# Patient Record
Sex: Female | Born: 1935 | Race: White | Hispanic: No | Smoking: Never smoker
Health system: Southern US, Community
[De-identification: ages and names within clinical notes are randomized; demographics above are authoritative.]

## PROBLEM LIST (undated history)

## (undated) DIAGNOSIS — G479 Sleep disorder, unspecified: Secondary | ICD-10-CM

## (undated) DIAGNOSIS — I5032 Chronic diastolic (congestive) heart failure: Secondary | ICD-10-CM

## (undated) DIAGNOSIS — J189 Pneumonia, unspecified organism: Secondary | ICD-10-CM

## (undated) DIAGNOSIS — R112 Nausea with vomiting, unspecified: Secondary | ICD-10-CM

## (undated) DIAGNOSIS — T8859XA Other complications of anesthesia, initial encounter: Secondary | ICD-10-CM

## (undated) DIAGNOSIS — Z9889 Other specified postprocedural states: Secondary | ICD-10-CM

## (undated) DIAGNOSIS — M316 Other giant cell arteritis: Secondary | ICD-10-CM

## (undated) DIAGNOSIS — I480 Paroxysmal atrial fibrillation: Secondary | ICD-10-CM

## (undated) DIAGNOSIS — I2699 Other pulmonary embolism without acute cor pulmonale: Secondary | ICD-10-CM

## (undated) DIAGNOSIS — N186 End stage renal disease: Secondary | ICD-10-CM

## (undated) DIAGNOSIS — H269 Unspecified cataract: Secondary | ICD-10-CM

## (undated) DIAGNOSIS — I998 Other disorder of circulatory system: Secondary | ICD-10-CM

## (undated) DIAGNOSIS — I10 Essential (primary) hypertension: Secondary | ICD-10-CM

## (undated) DIAGNOSIS — T4145XA Adverse effect of unspecified anesthetic, initial encounter: Secondary | ICD-10-CM

## (undated) DIAGNOSIS — I272 Pulmonary hypertension, unspecified: Secondary | ICD-10-CM

## (undated) DIAGNOSIS — Z87898 Personal history of other specified conditions: Secondary | ICD-10-CM

## (undated) DIAGNOSIS — K219 Gastro-esophageal reflux disease without esophagitis: Secondary | ICD-10-CM

## (undated) DIAGNOSIS — I340 Nonrheumatic mitral (valve) insufficiency: Secondary | ICD-10-CM

## (undated) DIAGNOSIS — H9190 Unspecified hearing loss, unspecified ear: Secondary | ICD-10-CM

## (undated) DIAGNOSIS — K5909 Other constipation: Secondary | ICD-10-CM

## (undated) DIAGNOSIS — E039 Hypothyroidism, unspecified: Secondary | ICD-10-CM

## (undated) DIAGNOSIS — D649 Anemia, unspecified: Secondary | ICD-10-CM

## (undated) HISTORY — DX: Other pulmonary embolism without acute cor pulmonale: I26.99

## (undated) HISTORY — DX: Nonrheumatic mitral (valve) insufficiency: I34.0

## (undated) HISTORY — DX: Pulmonary hypertension, unspecified: I27.20

## (undated) HISTORY — DX: Anemia, unspecified: D64.9

## (undated) HISTORY — DX: Essential (primary) hypertension: I10

## (undated) HISTORY — DX: Other disorder of circulatory system: I99.8

## (undated) HISTORY — PX: ABDOMINAL HYSTERECTOMY: SHX81

## (undated) HISTORY — DX: Hypothyroidism, unspecified: E03.9

## (undated) HISTORY — PX: OTHER SURGICAL HISTORY: SHX169

## (undated) HISTORY — PX: EYE SURGERY: SHX253

## (undated) HISTORY — DX: Paroxysmal atrial fibrillation: I48.0

## (undated) HISTORY — DX: Other giant cell arteritis: M31.6

## (undated) HISTORY — DX: Unspecified cataract: H26.9

## (undated) HISTORY — DX: Other specified postprocedural states: Z98.890

---

## 1995-01-21 DIAGNOSIS — I2699 Other pulmonary embolism without acute cor pulmonale: Secondary | ICD-10-CM

## 1995-01-21 HISTORY — DX: Other pulmonary embolism without acute cor pulmonale: I26.99

## 1997-05-31 ENCOUNTER — Other Ambulatory Visit: Admission: RE | Admit: 1997-05-31 | Discharge: 1997-05-31 | Payer: Self-pay | Admitting: Family Medicine

## 1997-07-12 ENCOUNTER — Other Ambulatory Visit: Admission: RE | Admit: 1997-07-12 | Discharge: 1997-07-12 | Payer: Self-pay | Admitting: Family Medicine

## 1997-08-15 ENCOUNTER — Other Ambulatory Visit: Admission: RE | Admit: 1997-08-15 | Discharge: 1997-08-15 | Payer: Self-pay | Admitting: Family Medicine

## 1997-10-26 ENCOUNTER — Other Ambulatory Visit: Admission: RE | Admit: 1997-10-26 | Discharge: 1997-10-26 | Payer: Self-pay | Admitting: Family Medicine

## 1998-04-26 ENCOUNTER — Ambulatory Visit (HOSPITAL_COMMUNITY): Admission: RE | Admit: 1998-04-26 | Discharge: 1998-04-26 | Payer: Self-pay | Admitting: *Deleted

## 1998-12-26 ENCOUNTER — Ambulatory Visit (HOSPITAL_COMMUNITY): Admission: RE | Admit: 1998-12-26 | Discharge: 1998-12-26 | Payer: Self-pay | Admitting: Family Medicine

## 1999-06-05 ENCOUNTER — Encounter: Payer: Self-pay | Admitting: Family Medicine

## 1999-06-05 ENCOUNTER — Encounter: Admission: RE | Admit: 1999-06-05 | Discharge: 1999-06-05 | Payer: Self-pay | Admitting: Family Medicine

## 1999-11-12 ENCOUNTER — Other Ambulatory Visit: Admission: RE | Admit: 1999-11-12 | Discharge: 1999-11-12 | Payer: Self-pay | Admitting: Family Medicine

## 2000-11-18 ENCOUNTER — Encounter: Admission: RE | Admit: 2000-11-18 | Discharge: 2000-11-18 | Payer: Self-pay | Admitting: Family Medicine

## 2000-11-18 ENCOUNTER — Encounter: Payer: Self-pay | Admitting: Family Medicine

## 2001-12-23 ENCOUNTER — Encounter: Admission: RE | Admit: 2001-12-23 | Discharge: 2001-12-23 | Payer: Self-pay | Admitting: Family Medicine

## 2001-12-23 ENCOUNTER — Encounter: Payer: Self-pay | Admitting: Family Medicine

## 2003-03-13 ENCOUNTER — Encounter: Admission: RE | Admit: 2003-03-13 | Discharge: 2003-03-13 | Payer: Self-pay | Admitting: Family Medicine

## 2003-03-21 ENCOUNTER — Encounter: Admission: RE | Admit: 2003-03-21 | Discharge: 2003-03-21 | Payer: Self-pay | Admitting: Family Medicine

## 2004-04-16 ENCOUNTER — Encounter: Admission: RE | Admit: 2004-04-16 | Discharge: 2004-04-16 | Payer: Self-pay | Admitting: Family Medicine

## 2004-09-20 ENCOUNTER — Ambulatory Visit: Payer: Self-pay | Admitting: Family Medicine

## 2004-09-20 ENCOUNTER — Inpatient Hospital Stay (HOSPITAL_COMMUNITY): Admission: EM | Admit: 2004-09-20 | Discharge: 2004-09-22 | Payer: Self-pay | Admitting: Emergency Medicine

## 2005-03-18 ENCOUNTER — Ambulatory Visit (HOSPITAL_COMMUNITY): Admission: RE | Admit: 2005-03-18 | Discharge: 2005-03-18 | Payer: Self-pay | Admitting: Ophthalmology

## 2005-05-29 ENCOUNTER — Encounter: Admission: RE | Admit: 2005-05-29 | Discharge: 2005-05-29 | Payer: Self-pay | Admitting: Family Medicine

## 2006-05-29 ENCOUNTER — Ambulatory Visit: Payer: Self-pay

## 2006-05-29 ENCOUNTER — Encounter: Payer: Self-pay | Admitting: Cardiology

## 2006-07-23 ENCOUNTER — Encounter: Admission: RE | Admit: 2006-07-23 | Discharge: 2006-07-23 | Payer: Self-pay | Admitting: Family Medicine

## 2007-07-28 ENCOUNTER — Encounter: Admission: RE | Admit: 2007-07-28 | Discharge: 2007-07-28 | Payer: Self-pay | Admitting: Family Medicine

## 2008-03-09 ENCOUNTER — Ambulatory Visit: Payer: Self-pay

## 2008-03-09 ENCOUNTER — Encounter (INDEPENDENT_AMBULATORY_CARE_PROVIDER_SITE_OTHER): Payer: Self-pay | Admitting: Family Medicine

## 2008-03-29 ENCOUNTER — Ambulatory Visit: Payer: Self-pay | Admitting: Cardiology

## 2008-03-29 ENCOUNTER — Encounter: Payer: Self-pay | Admitting: Cardiology

## 2008-03-29 DIAGNOSIS — R002 Palpitations: Secondary | ICD-10-CM

## 2008-03-29 DIAGNOSIS — N259 Disorder resulting from impaired renal tubular function, unspecified: Secondary | ICD-10-CM | POA: Insufficient documentation

## 2008-03-29 DIAGNOSIS — R0602 Shortness of breath: Secondary | ICD-10-CM | POA: Insufficient documentation

## 2008-03-29 DIAGNOSIS — I080 Rheumatic disorders of both mitral and aortic valves: Secondary | ICD-10-CM

## 2008-04-07 ENCOUNTER — Ambulatory Visit: Payer: Self-pay | Admitting: Cardiology

## 2008-04-07 ENCOUNTER — Ambulatory Visit: Payer: Self-pay

## 2008-04-22 ENCOUNTER — Emergency Department (HOSPITAL_COMMUNITY): Admission: EM | Admit: 2008-04-22 | Discharge: 2008-04-22 | Payer: Self-pay | Admitting: Emergency Medicine

## 2008-04-26 ENCOUNTER — Observation Stay (HOSPITAL_COMMUNITY): Admission: EM | Admit: 2008-04-26 | Discharge: 2008-04-27 | Payer: Self-pay | Admitting: Emergency Medicine

## 2008-04-29 ENCOUNTER — Emergency Department (HOSPITAL_COMMUNITY): Admission: EM | Admit: 2008-04-29 | Discharge: 2008-04-29 | Payer: Self-pay | Admitting: Emergency Medicine

## 2008-05-02 ENCOUNTER — Ambulatory Visit: Payer: Self-pay | Admitting: Cardiology

## 2008-05-02 ENCOUNTER — Encounter: Payer: Self-pay | Admitting: Cardiology

## 2008-06-28 ENCOUNTER — Encounter: Payer: Self-pay | Admitting: Cardiology

## 2008-07-13 ENCOUNTER — Emergency Department (HOSPITAL_COMMUNITY): Admission: EM | Admit: 2008-07-13 | Discharge: 2008-07-13 | Payer: Self-pay | Admitting: Emergency Medicine

## 2008-08-14 ENCOUNTER — Ambulatory Visit (HOSPITAL_COMMUNITY): Admission: RE | Admit: 2008-08-14 | Discharge: 2008-08-14 | Payer: Self-pay | Admitting: Gastroenterology

## 2008-08-21 ENCOUNTER — Encounter: Admission: RE | Admit: 2008-08-21 | Discharge: 2008-08-21 | Payer: Self-pay | Admitting: Family Medicine

## 2008-09-06 ENCOUNTER — Encounter (INDEPENDENT_AMBULATORY_CARE_PROVIDER_SITE_OTHER): Payer: Self-pay | Admitting: *Deleted

## 2008-09-18 ENCOUNTER — Encounter: Payer: Self-pay | Admitting: Cardiology

## 2008-09-19 ENCOUNTER — Encounter: Payer: Self-pay | Admitting: Cardiology

## 2008-10-09 ENCOUNTER — Encounter: Payer: Self-pay | Admitting: Cardiology

## 2008-10-12 ENCOUNTER — Emergency Department (HOSPITAL_COMMUNITY): Admission: EM | Admit: 2008-10-12 | Discharge: 2008-10-12 | Payer: Self-pay | Admitting: Emergency Medicine

## 2008-11-02 ENCOUNTER — Ambulatory Visit: Payer: Self-pay | Admitting: Cardiology

## 2009-08-28 ENCOUNTER — Encounter: Admission: RE | Admit: 2009-08-28 | Discharge: 2009-08-28 | Payer: Self-pay | Admitting: Family Medicine

## 2009-11-09 ENCOUNTER — Telehealth: Payer: Self-pay | Admitting: Cardiology

## 2009-12-03 ENCOUNTER — Ambulatory Visit: Payer: Self-pay | Admitting: Cardiology

## 2009-12-03 LAB — CONVERTED CEMR LAB
BUN: 42 mg/dL — ABNORMAL HIGH (ref 6–23)
Calcium: 9 mg/dL (ref 8.4–10.5)
GFR calc non Af Amer: 21.64 mL/min (ref >60)
Potassium: 5 meq/L (ref 3.5–5.1)
Pro B Natriuretic peptide (BNP): 474.6 pg/mL — ABNORMAL HIGH (ref 0.0–100.0)

## 2009-12-04 ENCOUNTER — Telehealth: Payer: Self-pay | Admitting: Cardiology

## 2009-12-12 ENCOUNTER — Telehealth (INDEPENDENT_AMBULATORY_CARE_PROVIDER_SITE_OTHER): Payer: Self-pay | Admitting: *Deleted

## 2009-12-17 ENCOUNTER — Ambulatory Visit: Payer: Self-pay | Admitting: Cardiology

## 2009-12-17 ENCOUNTER — Encounter: Payer: Self-pay | Admitting: Internal Medicine

## 2009-12-17 ENCOUNTER — Ambulatory Visit: Payer: Self-pay

## 2009-12-17 ENCOUNTER — Encounter (HOSPITAL_COMMUNITY)
Admission: RE | Admit: 2009-12-17 | Discharge: 2010-02-19 | Payer: Self-pay | Source: Home / Self Care | Attending: Cardiology | Admitting: Cardiology

## 2009-12-17 DIAGNOSIS — I4891 Unspecified atrial fibrillation: Secondary | ICD-10-CM

## 2009-12-20 ENCOUNTER — Ambulatory Visit: Payer: Self-pay | Admitting: Cardiology

## 2009-12-20 DIAGNOSIS — R9439 Abnormal result of other cardiovascular function study: Secondary | ICD-10-CM | POA: Insufficient documentation

## 2009-12-21 ENCOUNTER — Telehealth: Payer: Self-pay | Admitting: Cardiology

## 2009-12-23 ENCOUNTER — Ambulatory Visit: Payer: Self-pay | Admitting: Cardiology

## 2009-12-23 ENCOUNTER — Inpatient Hospital Stay (HOSPITAL_COMMUNITY)
Admission: EM | Admit: 2009-12-23 | Discharge: 2009-12-26 | Payer: Self-pay | Source: Home / Self Care | Attending: Cardiology | Admitting: Cardiology

## 2009-12-24 ENCOUNTER — Inpatient Hospital Stay: Admission: RE | Admit: 2009-12-24 | Payer: Self-pay | Admitting: Cardiology

## 2009-12-31 ENCOUNTER — Encounter: Payer: Self-pay | Admitting: Cardiology

## 2010-01-09 ENCOUNTER — Encounter: Payer: Self-pay | Admitting: Physician Assistant

## 2010-01-09 ENCOUNTER — Ambulatory Visit: Payer: Self-pay | Admitting: Physician Assistant

## 2010-01-09 DIAGNOSIS — D649 Anemia, unspecified: Secondary | ICD-10-CM | POA: Insufficient documentation

## 2010-01-09 DIAGNOSIS — R300 Dysuria: Secondary | ICD-10-CM | POA: Insufficient documentation

## 2010-01-09 DIAGNOSIS — I2789 Other specified pulmonary heart diseases: Secondary | ICD-10-CM | POA: Insufficient documentation

## 2010-01-09 DIAGNOSIS — I10 Essential (primary) hypertension: Secondary | ICD-10-CM | POA: Insufficient documentation

## 2010-01-09 LAB — CONVERTED CEMR LAB
Basophils Relative: 0.7 % (ref 0.0–3.0)
Calcium: 9.1 mg/dL (ref 8.4–10.5)
Chloride: 101 meq/L (ref 96–112)
Creatinine, Ser: 2.5 mg/dL — ABNORMAL HIGH (ref 0.4–1.2)
Eosinophils Relative: 2.2 % (ref 0.0–5.0)
GFR calc non Af Amer: 19.92 mL/min — ABNORMAL LOW (ref >60.00)
Lymphocytes Relative: 17.3 % (ref 12.0–46.0)
MCV: 102.6 fL — ABNORMAL HIGH (ref 78.0–100.0)
Monocytes Relative: 10.2 % (ref 3.0–12.0)
Neutrophils Relative %: 69.6 % (ref 43.0–77.0)
Nitrite: NEGATIVE
RBC: 2.71 M/uL — ABNORMAL LOW (ref 3.87–5.11)
Specific Gravity, Urine: 1.01 (ref 1.000–1.030)
Total Protein, Urine: 30 mg/dL
Urine Glucose: NEGATIVE mg/dL
WBC: 6.8 10*3/uL (ref 4.5–10.5)
pH: 7 (ref 5.0–8.0)

## 2010-01-10 ENCOUNTER — Encounter: Payer: Self-pay | Admitting: Physician Assistant

## 2010-02-07 ENCOUNTER — Ambulatory Visit: Admission: RE | Admit: 2010-02-07 | Discharge: 2010-02-07 | Payer: Self-pay | Source: Home / Self Care

## 2010-02-07 ENCOUNTER — Ambulatory Visit
Admission: RE | Admit: 2010-02-07 | Discharge: 2010-02-07 | Payer: Self-pay | Source: Home / Self Care | Attending: Cardiology | Admitting: Cardiology

## 2010-02-07 ENCOUNTER — Ambulatory Visit (HOSPITAL_COMMUNITY)
Admission: RE | Admit: 2010-02-07 | Discharge: 2010-02-07 | Payer: Self-pay | Source: Home / Self Care | Attending: Cardiology | Admitting: Cardiology

## 2010-02-07 ENCOUNTER — Encounter: Payer: Self-pay | Admitting: Cardiology

## 2010-02-10 ENCOUNTER — Encounter: Payer: Self-pay | Admitting: Family Medicine

## 2010-02-21 NOTE — Assessment & Plan Note (Signed)
Summary: 4 month rov.sl   Visit Type:  Follow-up Primary Provider:  Lianne Bushy, MD  CC:  Pulmonary HTN and HTN.  History of Present Illness: The patient presents for evaluation of the above. Since I last saw her she has had no new cardiovascular complaints. She continues to sleep on 2-3 pillows which has been chronic for her. Lower extremity swelling is unchanged. She did have an echocardiogram today with results pending. She has had no new chest pressure, neck or arm discomfort. Her dyspnea with exertion is unchanged. She has had no palpitations, presyncope or syncope. She's had no change in weight. She did have labs drawn in December and her renal insufficiency appears to be stable.  Current Medications (verified): 1)  Multivitamins   Tabs (Multiple Vitamin) .... Daily 2)  Vitamin D 400 Unit Caps (Cholecalciferol) .[name].. 1 Cap Two Times A Day 3)  Metoprolol Succinate 50 Mg Xr24h-Tab (Metoprolol Succinate) .... One and One-Half Every Am and Two Every Pm 4)  Levothyroxine Sodium 75 Mcg Tabs (Levothyroxine Sodium) .... One Po Daily 5)  Hydroxyzine Hcl 25 Mg Tabs (Hydroxyzine Hcl) .[name].. 1 By Mouth At Bedtime 6)  Miralax  Powd (Polyethylene Glycol 3350) .... Uad 7)  Coumadin 5 Mg Tabs (Warfarin Sodium) .... As Directed 8)  Acetaminophen 500 Mg  Caps (Acetaminophen) .... As Needed 9)  Prilosec 20 Mg Cpdr (Omeprazole) .[name].. 1 Cap Once Daily 10)  Amlodipine Besylate 5 Mg Tabs (Amlodipine Besylate) .[name].. 1 Tab Once Daily 11)  Tylenol 325 Mg Tabs (Acetaminophen) .... Prn 12)  Tylenol Pm Extra Strength 500-25 Mg Tabs (Diphenhydramine-Apap (Sleep)) .... As Needed Qhs  Allergies (verified): 1)  ! Sulfa 2)  ! Codeine  Past History:  Past Medical History: Reviewed history from 01/09/2010 and no changes required. Htn x 2 years Hypothyroidism Temporal arteritis Renal insufficiency History of pulmonary emboli Mild  mitral regurgitation Probable heart failure with a well-preserved ejection  fraction Cataracts Chronic anemia Paroxysmal atrial fibrillation Mod. Pulmonary HTN   a. R Ht Cath 12/2009:  RA mean 12; RV 45/11; PA 47/21; PCWP mean 26; CO 4.56; CI 2.7 Abnormal Myoview 12/2009   a.  anterior ischemia; EF 75%   b.  no cath done due to CKD  Past Surgical History: Reviewed history from 05/02/2008 and no changes required. Temporal Artery BX Cataract Surgery  Vital Signs:  Patient profile:   77 year old female Height:      63 inches Weight:      140 pounds Pulse rate:   67 / minute BP sitting:   164 / 77  (left arm)  Vitals Entered By: Burnett Kanaris, CNA (February 07, 2010 5:10 PM)  Physical Exam  General:  Well developed, well nourished, in no acute distress. Head:  normocephalic and atraumatic Eyes:  PERRLA/EOM intact; conjunctiva and lids normal. Neck:  Neck supple, no JVD. No masses, thyromegaly or abnormal cervical nodes. Chest Wall:  no deformities or breast masses noted Lungs:  Clear bilaterally to auscultation and percussion. Abdomen:  Bowel sounds positive; abdomen soft and non-tender without masses, organomegaly, or hernias noted. No hepatosplenomegaly. Msk:  Back normal, normal gait. Muscle strength and tone normal. Extremities:  No clubbing or cyanosis, mild bilateral edema. Neurologic:  Alert and oriented x 3. Skin:  Intact without lesions or rashes. Cervical Nodes:  no significant adenopathy Inguinal Nodes:  no significant adenopathy Psych:  Normal affect.   Detailed Cardiovascular Exam  Neck    Carotids: Carotids full and equal bilaterally without bruits.  Neck Veins: Normal, no JVD.    Heart    Inspection: no deformities or lifts noted.      Palpation: normal PMI with no thrills palpable.      Auscultation: regular rate and rhythm, S1, S2 without murmurs, rubs, gallops, or clicks.    Vascular    Abdominal Aorta: no palpable masses, pulsations, or audible bruits.      Femoral Pulses: normal femoral pulses bilaterally.       Pedal Pulses: normal pedal pulses bilaterally.      Radial Pulses: normal radial pulses bilaterally.      Peripheral Circulation: no clubbing, cyanosis, with normal capillary refill.     Impression & Recommendations:  Problem # 1:  HYPERTENSION, BENIGN (ICD-401.1) I looking at her blood pressure and it was still slightly elevated. I will increase her metoprolol to 100 mg b.i.d. She will start to keep a blood pressure diary at home. Further changes will be based on the results  Problem # 2:  PULMONARY HYPERTENSION (ICD-416.8) I will reassess her pulmonary pressures with the echo. This confirms continued pulmonary hypertension consider treatment with sildenafil although she is less symptomatic at this point.  Problem # 3:  ATRIAL FIBRILLATION (ICD-427.31) The patient tolerates Coumadin. She's had no symptoms related to this. No change in therapy is indicated.  Problem # 4:  RENAL INSUFFICIENCY (ICD-588.9) This will be followed by her primary physician going forward.  I will avoid ACE/ARB or diuretic for now.  Patient Instructions: 1)  Your physician recommends that you schedule a follow-up appointment in: 6 months with Dr Antoine Poche 2)  Your physician has recommended you make the following change in your medication: Increase Metoprolol to 100 mg twice a day. Prescriptions: METOPROLOL SUCCINATE 100 MG XR24H-TAB (METOPROLOL SUCCINATE) one two times a day  #60 x 11   Entered by:   Charolotte Capuchin, RN   Authorized by:   Rollene Rotunda, MD, [facility]   Signed by:   Charolotte Capuchin, RN on 02/07/2010   Method used:   [facility]        [facility] [phone]* (retail)       [address]       [place], [place]  [address]       Ph: [phone] or [phone]       Fax: [phone]   RxID:   [id number]  I have reviewed and approved all prescriptions at the time of this visit. Rollene Rotunda, MD, [facility]  February 07, 2010 6:23 PM

## 2010-02-21 NOTE — Progress Notes (Signed)
Summary: question cath procedure  Phone Note Call from Patient Call back at Home Phone [phone]   Caller: Patient Reason for Call: Talk to Nurse Summary of Call: pt has question on her cath procedure. Initial call taken by: Roe Coombs,  December 21, 2009 3:42 PM  Follow-up for Phone Call        other daughter that was not with pt yesterday wanted to know when they would know to go to the hospital on Sunday.  Explained to daughter that the admitting office from [facility] will call the pt's home phone number when the bed assignment is ready.  Daughter states understanding. Follow-up by: Charolotte Capuchin, RN,  December 21, 2009 4:42 PM

## 2010-02-21 NOTE — Assessment & Plan Note (Signed)
Summary: EPH/JML   Visit Type:  EPH Primary Provider:  Lianne Bushy, MD  CC:  pt states she has some LUQ achiness at times as well as some sob...edema/ankles during the day but gets better at night.  History of Present Illness: Primary Cardiologist:  Dr. Rollene Rotunda  Cheryl [name] is a 77 -year-old female with hypertension, hypothyroidism, chronic kidney disease who was evaluated recently for dyspnea.  She was set up for a nuclear stress test.  She was noted to be in atrial fibrillation and her images were suggestive of anterior ischemia.  She was set up for right and left heart catheterization.  However, with her elevated creatinine, left heart catheterization was done for pressure measurement only.  Her catheterization demonstrated moderately elevated pulmonary pressures and it is suspected that her dyspnea was secondary to pulmonary hypertension.  It was suggested that sildenafil may be considered.  She was also to be set up for an echocardiogram.  Of note, a renal ultrasound demonstrated findings suggestive of medical renal disease.  She has chronic anemia.  Her diuretics were placed on hold given her renal insufficiency.  Amlodipine was added to her medical regimen.  She returns for follow up.  She continues to note dyspnea with exertion.  There has been no change.  She probably describes New York Heart Association class III symptoms.  She sleeps on 2 pillows.  She denies any true paroxysmal nocturnal dyspnea.  Her lower extremity edema is stable.  She has some right upper chest discomfort that she has noted this for the last year or so.  She  fell some time ago and has had pain there since.  She denies any exertional chest heaviness or tightness.  She denies syncope.  Her Coumadin is followed with her primary care physician.   Current Medications (verified): 1)  Multivitamins   Tabs (Multiple Vitamin) .... Daily 2)  Vitamin D 400 Unit Caps (Cholecalciferol) .[name].. 1 Cap Two Times A  Day 3)  Metoprolol Succinate 50 Mg Xr24h-Tab (Metoprolol Succinate) .... One and One-Half Every Am and Two Every Pm 4)  Levothyroxine Sodium 75 Mcg Tabs (Levothyroxine Sodium) .... One Po Daily 5)  Hydroxyzine Hcl 25 Mg Tabs (Hydroxyzine Hcl) .[name].. 1 By Mouth At Bedtime 6)  Miralax  Powd (Polyethylene Glycol 3350) .... Uad 7)  Coumadin 5 Mg Tabs (Warfarin Sodium) .... As Directed 8)  Acetaminophen 500 Mg  Caps (Acetaminophen) .... As Needed 9)  Prilosec 20 Mg Cpdr (Omeprazole) .[name].. 1 Cap Once Daily 10)  Amlodipine Besylate 5 Mg Tabs (Amlodipine Besylate) .[name].. 1 Tab Once Daily  Allergies: 1)  ! Sulfa 2)  ! Codeine  Past History:  Past Medical History: Htn x 2 years Hypothyroidism Temporal arteritis Renal insufficiency History of pulmonary emboli Mild  mitral regurgitation Probable heart failure with a well-preserved ejection fraction Cataracts Chronic anemia Paroxysmal atrial fibrillation Mod. Pulmonary HTN   a. R Ht Cath 12/2009:  RA mean 12; RV 45/11; PA 47/21; PCWP mean 26; CO 4.56; CI 2.7 Abnormal Myoview 12/2009   a.  anterior ischemia; EF 75%   b.  no cath done due to CKD  Review of Systems       She notes some pelvic pressure for 2 days.  No frequency or hematuria.  Vital Signs:  Patient profile:   77 year old female Height:      63 inches Weight:      150 pounds BMI:     26.67 Pulse rate:   59 /  minute Pulse rhythm:   irregular BP sitting:   156 / 70  (left arm) Cuff size:   regular  Vitals Entered By: Danielle Rankin, CMA (January 09, 2010 8:24 AM)  Physical Exam  General:  Well nourished, well developed, in no acute distress HEENT: normal Neck: no JVD Cardiac:  normal S1, S2; RRR; no murmur Lungs:  clear to auscultation bilaterally, no wheezing, rhonchi or rales Abd: soft, nontender, no hepatomegaly Ext: tracre  edema; R FA site without hematoma or bruit Skin: warm and dry Neuro:  CNs 2-12 intact, no focal abnormalities noted    EKG  Procedure  date:  01/09/2010  Findings:      Sinus Bradycardia Heart rate 59 Leftward axis Incomplete right bundle branch block Nonspecific ST-T wave changes No change since 12/03/2009    Impression & Recommendations:  Problem # 1:  PULMONARY HYPERTENSION (ICD-416.8)  I will order an echocardiogram to followup.  She will be brought back in followup with Dr. Antoine Poche to discuss possible treatment with sildenafil.  Orders: EKG w/ Interpretation (93000) Urinalysis (56213-08657) Echocardiogram (Echo) TLB-BMP (Basic Metabolic Panel-BMET) (80048-METABOL) TLB-CBC Platelet - w/Differential (85025-CBCD)  Problem # 2:  MYOCARDIAL PERFUSION SCAN, WITH STRESS TEST, ABNORMAL (ICD-794.39) She does not seem to be having any unstable anginal symptoms.  She is not on aspirin as she is on Coumadin.  She will continue her beta blocker.  Problem # 3:  ATRIAL FIBRILLATION (ICD-427.31)  Coumadin is followed by her primary care physician.  She is maintaining normal sinus rhythm.  Problem # 4:  RENAL INSUFFICIENCY (ICD-588.9)  Check a basic metabolic panel today.  Orders: TLB-BMP (Basic Metabolic Panel-BMET) (80048-METABOL) TLB-CBC Platelet - w/Differential (85025-CBCD)  Problem # 5:  HYPERTENSION, BENIGN (ICD-401.1)  She has not taken any of her medications yet today.  Her blood pressure when checked at the [facility] recently demonstrated a reading of 120/80.  No adjustments will be made in her medicines today.  Problem # 6:  DYSURIA (ICD-788.1)  I will go ahead and check a urinalysis today.  Orders: Urinalysis (84696-29528) Urinalysis (81003-65000) TLB-Udip ONLY (81003-UDIP)  Problem # 7:  UNSPECIFIED ANEMIA (ICD-285.9)  Probl of chronic disease. Ck CBC today.  Patient Instructions: 1)  Your physician recommends that you schedule a follow-up appointment in: WITH DR. HOCHREIN IN 3-4 WEEKS 2)  Your physician recommends that you return for lab work UX:[id number] FOR BMET AND CBC 3)  Your  physician has requested that you have an echocardiogram.  Echocardiography is a painless test that uses sound waves to create images of your heart. It provides your doctor with information about the size and shape of your heart and how well your heart's chambers and valves are working.  This procedure takes approximately one hour. There are no restrictions for this procedure. 4)  Your physician has recommended that you have an urinalysis today for difficulty urinating.

## 2010-02-21 NOTE — Progress Notes (Signed)
Summary: refill  Phone Note Refill Request Message from:  Patient on November 09, 2009 11:39 AM  Refills Requested: Medication #1:  METOPROLOL SUCCINATE 50 MG XR24H-TAB one every am and two every pm [phone]  Initial call taken by: Judie Grieve,  November 09, 2009 11:40 AM  Follow-up for Phone Call        RX already picked up at pharmacy. LMOM for pt. Marrion Coy, CNA  November 09, 2009 1:13 PM  Follow-up by: Marrion Coy, CNA,  November 09, 2009 1:13 PM

## 2010-02-21 NOTE — Progress Notes (Signed)
Summary: re blood test results  Phone Note Call from Patient   Caller: daughter [phone] [place]  Reason for Call: Talk to Nurse, Lab or Test Results Summary of Call: pt's dtr calling re blood test results Initial call taken by: Glynda Jaeger,  December 04, 2009 10:13 AM  Follow-up for Phone Call        daughter aware of results Follow-up by: Charolotte Capuchin, RN,  December 04, 2009 11:14 AM

## 2010-02-21 NOTE — Assessment & Plan Note (Signed)
Summary: Cardiology Nuclear Testing  Nuclear Med Background Indications for Stress Test: Evaluation for Ischemia   History: Echo   Symptoms: Chest Pressure, Chest Pressure with Exertion, Chest Tightness, Chest Tightness with Exertion, DOE, Fatigue, Palpitations, Rapid HR  Symptoms Comments:  Last episode of CP:2-3 days ago.   Nuclear Pre-Procedure Cardiac Risk Factors: Hypertension, RBBB Caffeine/Decaff Intake: none NPO After: 9:00 PM Lungs: Clear.  O2 Sat 98% on RA. IV 0.9% NS with Angio Cath: 22g     IV Site: R Hand IV Started by: Cathlyn Parsons, RN Chest Size (in) 38     Cup Size B     Height (in): 63 Weight (lb): 147 BMI: 26.13 Tech Comments: Metoprolol held this a.m.  Nuclear Med Study 1 or 2 day study:  1 day     Stress Test Type:  Eugenie Birks Reading MD:  Cassell Clement, MD     Referring MD:  Rollene Rotunda, MD Resting Radionuclide:  Technetium 55m Tetrofosmin     Resting Radionuclide Dose:  11 mCi  Stress Radionuclide:  Technetium 69m Tetrofosmin     Stress Radionuclide Dose:  33 mCi   Stress Protocol   Lexiscan: 0.4 mg   Stress Test Technologist:  Rea College, CMA-N     Nuclear Technologist:  Doyne Keel, CNMT  Rest Procedure  Myocardial perfusion imaging was performed at rest 45 minutes following the intravenous administration of Technetium 19m Tetrofosmin.  Stress Procedure  The patient received IV Lexiscan 0.4 mg over 15-seconds.  Technetium 26m Tetrofosmin injected at 30-seconds.  There were no significant changes with infusion.  Quantitative spect images were obtained after a 45 minute delay.  QPS Raw Data Images:  Normal; no motion artifact; normal heart/lung ratio. Stress Images:  Decreased uptake in the mid to distal anterior wall Rest Images:  Normal homogeneous uptake in all areas of the myocardium. Subtraction (SDS):  Anterior ischemia Transient Ischemic Dilatation:  1.64  (Normal <1.22)  Lung/Heart Ratio:  .28  (Normal <0.45)  Quantitative  Gated Spect Images QGS EDV:  70 ml QGS ESV:  17 ml QGS EF:  75 % QGS cine images:  Normal  Findings Abnormal nuclear study Evidence for anterior (septal apical) ischemia      Overall Impression  Exercise Capacity: Lexiscan with no exercise. ECG Impression: No significant ST segment change suggestive of ischemia. Overall Impression: Abnormal stress nuclear study. Overall Impression Comments: Prior to test patient with brief run of AF which resolved spontaneously. Perfusion imaging shows reversible defect in the anterior wall suggestive of ischemia. The defect is in the area where we typically see breast attenuation but there is no breast shadow seen on raw stress images to explain thus I feel this is likely true ischemia.   Appended Document: Cardiology Nuclear Testing pt aware and was hospitalized

## 2010-02-21 NOTE — Assessment & Plan Note (Signed)
Summary: 1 yr rov 401.1 424.0  pfh,rn   Visit Type:  Follow-up Primary Cheryl [name]:  Cheryl Bushy, MD  CC:  Dyspnea.  History of Present Illness: The patient presents for yearly followup. Since I last saw her she has had increasing dyspnea particularly over the last several weeks. She will get short of breath walking on level ground. She also seems to be describing orthopnea and PND. She is not having any weight gain or edema. She does not describe cough fevers or chills. She denies chest pressure, neck or arm discomfort. She does have palpitations that have been with some increasing frequency perhaps monthly. These have been described previously. However, I could not capture these on an event monitor although there was some technical difficulty with her wearing the monitor. She has not had any presyncope or syncope. The symptoms may last for one or 2 hours and go away spontaneously.  Current Medications (verified): 1)  Multivitamins   Tabs (Multiple Vitamin) .... Daily 2)  Vitamind .... One By Mouth Daily 3)  Metoprolol Succinate 50 Mg Xr24h-Tab (Metoprolol Succinate) .... One Every Am and Two Every Pm 4)  Coumadin .... As Directed 5)  Vitamin B12 .... One By Mouth Daily 6)  Levothyroxine Sodium 75 Mcg Tabs (Levothyroxine Sodium) .... One Po Daily 7)  Losartan Potassium-Hctz 100-25 Mg Tabs (Losartan Potassium-Hctz) .[name].. 1 By Mouth Daily 8)  Prilosec 20 Mg Cpdr (Omeprazole) .... Daily 9)  Benicar 40 Mg Tabs (Olmesartan Medoxomil) .[name].. 1 By Mouth Daily 10)  Hydroxyzine Hcl 25 Mg Tabs (Hydroxyzine Hcl) .[name].. 1 By Mouth At Bedtime 11)  Miralax  Powd (Polyethylene Glycol 3350) .... Uad  Allergies (verified): No Known Drug Allergies  Past History:  Past Medical History: Htn x 2 years Hypothyroidism Temporal arteritis Renal insufficiency History of pulmonary emboli Mild  mitral regurgitation Probable heart failure with a well-preserved ejection fraction Cataracts Chronic anemia  Past  Surgical History: Reviewed history from 05/02/2008 and no changes required. Temporal Artery BX Cataract Surgery  Review of Systems       As stated in the HPI and negative for all other systems.   Vital Signs:  Patient profile:   77 year old female Height:      63 inches Weight:      151 pounds BMI:     26.85 Pulse rate:   67 / minute Resp:     16 per minute BP sitting:   138 / 80  (right arm)  Vitals Entered By: Marrion Coy, CNA (December 03, 2009 11:45 AM)  Physical Exam  General:  Well developed, well nourished, in no acute distress. Head:  normocephalic and atraumatic Eyes:  PERRLA/EOM intact; conjunctiva and lids normal. Neck:  Neck supple, no JVD. No masses, thyromegaly or abnormal cervical nodes. Chest Wall:  no deformities or breast masses noted Lungs:  Clear bilaterally to auscultation and percussion. Abdomen:  Bowel sounds positive; abdomen soft and non-tender without masses, organomegaly, or hernias noted. No hepatosplenomegaly. Msk:  Back normal, normal gait. Muscle strength and tone normal. Extremities:  No clubbing or cyanosis. Neurologic:  Alert and oriented x 3. Skin:  Intact without lesions or rashes. Cervical Nodes:  no significant adenopathy Inguinal Nodes:  no significant adenopathy Psych:  Normal affect.   Detailed Cardiovascular Exam  Neck    Carotids: Carotids full and equal bilaterally without bruits.      Neck Veins: Normal, no JVD.    Heart    Inspection: no deformities or lifts noted.  Palpation: normal PMI with no thrills palpable.      Auscultation: regular rate and rhythm, S1, S2 without murmurs, rubs, gallops, or clicks.    Vascular    Abdominal Aorta: no palpable masses, pulsations, or audible bruits.      Femoral Pulses: normal femoral pulses bilaterally.      Pedal Pulses: pulses normal in all 4 extremities    Radial Pulses: normal radial pulses bilaterally.      Peripheral Circulation: no clubbing, cyanosis, or edema noted  with normal capillary refill.     EKG  Procedure date:  12/03/2009  Findings:      Sinus rhythm, rate 65, first degree AV block, RSR prime V1 and V2, incomplete right bundle branch block, no acute ST-T wave changes.  Impression & Recommendations:  Problem # 1:  SHORTNESS OF BREATH (ICD-786.05) This seems to be progressive. At this point I will check a BNP and a basic metabolic profile. I will also order a stress perfusion study. Further treatment and evaluation will be based on these findings. Orders: Nuclear Stress Test (Nuc Stress Test) TLB-BMP (Basic Metabolic Panel-BMET) (80048-METABOL) TLB-BNP (B-Natriuretic Peptide) (83880-BNPR)  Problem # 2:  MITRAL REGURGITATION (ICD-396.3) I do not strongly suspect that this is worse as it was only mild on the last echo. If her BNP however is elevated I will repeat an echo.  Problem # 3:  PALPITATIONS (ICD-785.1) I have given her instructions on how she might get his recorded at home by EMS or other reminders as an event monitor didn't work in the past. I would like to see his before attempting any treatment.  Problem # 4:  RENAL INSUFFICIENCY (ICD-588.9) I will check a basic metabolic profile.  Patient Instructions: 1)  Your physician recommends that you schedule a follow-up appointment in: 4 months 2)  Your physician recommends that you continue on your current medications as directed. Please refer to the Current Medication list given to you today. 3)  Your physician has requested that you have an adenosine myoview.  For further information please visit [email or web address]  Please follow instruction sheet, as given.

## 2010-02-21 NOTE — Assessment & Plan Note (Signed)
Summary: [facility]   Visit Type:  Follow-up Primary Provider:  Lianne Bushy, MD   History of Present Illness: I evaluated the patient today with regard to atrial fib on arrival. She converted to NSR when she arrived.  She was stable, and we agreed to proceed with her study.  We reviewed her findings in detail which show some anteror ischemia.  She is not having angina, but rather palpitations which seem to get somewhat fast.  She has renal insufficiency which is chronic.    Problems Prior to Update: 1)  Hypertension, Pulmonary  (ICD-416.8) 2)  Mitral Regurgitation  (ICD-396.3) 3)  Shortness of Breath  (ICD-786.05) 4)  Renal Insufficiency  (ICD-588.9) 5)  Palpitations  (ICD-785.1)  Current Medications (verified): 1)  Multivitamins   Tabs (Multiple Vitamin) .... Daily 2)  Vitamind .... One By Mouth Daily 3)  Metoprolol Succinate 50 Mg Xr24h-Tab (Metoprolol Succinate) .... One Every Am and Two Every Pm 4)  Coumadin .... As Directed 5)  Vitamin B12 .... One By Mouth Daily 6)  Levothyroxine Sodium 75 Mcg Tabs (Levothyroxine Sodium) .... One Po Daily 7)  Losartan Potassium-Hctz 100-25 Mg Tabs (Losartan Potassium-Hctz) .[name].. 1 By Mouth Daily 8)  Prilosec 20 Mg Cpdr (Omeprazole) .... Daily 9)  Hydroxyzine Hcl 25 Mg Tabs (Hydroxyzine Hcl) .[name].. 1 By Mouth At Bedtime 10)  Miralax  Powd (Polyethylene Glycol 3350) .... Uad 11)  Edarbi 80 Mg Tabs (Azilsartan Medoxomil) .... Take One A Day  Allergies (verified): 1)  ! Sulfa 2)  ! Codeine  Past History:  Past Medical History: Last updated: 12/03/2009 Htn x 2 years Hypothyroidism Temporal arteritis Renal insufficiency History of pulmonary emboli Mild  mitral regurgitation Probable heart failure with a well-preserved ejection fraction Cataracts Chronic anemia  Past Surgical History: Last updated: 05/02/2008 Temporal Artery BX Cataract Surgery  Family History: Last updated: 05/02/2008 There is no early heart disease  though her mother died at age 77 apparently of a myocardial infarction. Her father died at 57 of a CVA. She has a sister with hypertension.  Social History: Last updated: 05/02/2008 The patient is retired. She is married with 2 grown children, 4 grandchildren and several great children. She has never smoked cigarettes and does not drink alcohol.  Vital Signs:  Patient profile:   77 year old female Height:      63 inches Weight:      148 pounds Pulse rate:   68 / minute BP sitting:   110 / 78  Vitals Entered By: Jacquelin Hawking, CMA (December 17, 2009 12:35 PM)  Physical Exam  General:  Well developed, well nourished, in no acute distress. Head:  normocephalic and atraumatic Eyes:  PERRLA/EOM intact; conjunctiva and lids normal. Lungs:  Clear bilaterally to auscultation and percussion. Heart:  regular rhythm.  Apical murmur.  Msk:  Back normal, normal gait. Muscle strength and tone normal.   Impression & Recommendations:  Problem # 1:  ATRIAL FIBRILLATION (ICD-427.31) Initial study today showed atrial fib with relatively rapid ventricular response.  Will increase metoprolol gently, and have her see Dr. Antoine Poche back in a couple of days to decide on long term plan.  She may need admission for anti arrhtymic drug treatment.  Options and potential issues with medications discussed with patient in detail.   Her updated medication list for this problem includes:    Metoprolol Succinate 50 Mg Xr24h-tab (Metoprolol succinate) ..... One and one-half every am and two every pm  Problem # 2:  SHORTNESS OF BREATH (  ICD-786.05) Increased symptoms.  Study today demonstrates what appears to be anterior ischemia.  However, serum cr is elevated and risk of cath is increased. Will have her see [facility] on Thursday of this week to decide on plan .  Likely becomes symptomatic during bursts of af.  Fortunately, she is on warfarin for history of PE.   The following medications were removed from the medication  list:    Benicar 40 Mg Tabs (Olmesartan medoxomil) .[name]... 1 by mouth daily Her updated medication list for this problem includes:    Metoprolol Succinate 50 Mg Xr24h-tab (Metoprolol succinate) ..... One and one-half every am and two every pm    Losartan Potassium-hctz 100-25 Mg Tabs (Losartan potassium-hctz) .[name]... 1 by mouth daily    Edarbi 80 Mg Tabs (Azilsartan medoxomil) .[name]... Take one a day  Patient Instructions: 1)  Your physician recommends that you schedule a follow-up appointment with Dr Antoine Poche this week. 2)  Your physician has recommended you make the following change in your medication: PLEASE increase Metoprolol to one and one-half  tablet in the morning and two tablets in the evening 3)  Your physician has requested that you regularly monitor and record your blood pressure readings at home.  Please use the same machine at the same time of day to check your readings and record them to bring to your follow-up visit.

## 2010-02-21 NOTE — Miscellaneous (Signed)
Summary: urine C and S  Clinical Lists Changes  Orders: Added new Test order of T-Culture, Urine ([id number]) - Signed

## 2010-02-21 NOTE — Progress Notes (Signed)
Summary: Nuclear pre procedure  Phone Note Outgoing Call Call back at [facility] Phone [phone]   Call placed by: Rea College, CMA,  December 12, 2009 4:27 PM Call placed to: Patient Summary of Call: Reviewed information on Myoview Information Sheet (see scanned document for further details).  Spoke with patient.      Nuclear Med Background Indications for Stress Test: Evaluation for Ischemia   History: Echo   Symptoms: DOE, Palpitations    Nuclear Pre-Procedure Cardiac Risk Factors: Hypertension, RBBB Height (in): 63

## 2010-02-21 NOTE — Assessment & Plan Note (Signed)
Summary: f/u At Fib and abn myoview per Dr Riley Kill   Visit Type:  Follow-up Primary Provider:  Lianne Bushy, MD  CC:  Atrial Fibrillation.  History of Present Illness: The patient presents for followup of an abnormal stress test and atrial fibrillation. She had complaints as described in the previous note in particular dyspnea that happens paroxysmally. I sent her for a stress perfusion study and she did have a run of atrial fibrillation at that time. Her stress test demonstrated anteroseptal ischemia with a preserved ejection fraction area she's had no severe spells since that test. She has not had any further acute dyspnea and is not having PND or orthopnea. She is not describing chest pressure, neck or arm discomfort. She is not having presyncope or syncope he hasn't noticed any further palpitations. She has had no weight gain or edema.  Current Medications (verified): 1)  Multivitamins   Tabs (Multiple Vitamin) .... Daily 2)  Vitamind .... One By Mouth Daily 3)  Metoprolol Succinate 50 Mg Xr24h-Tab (Metoprolol Succinate) .... One and One-Half Every Am and Two Every Pm 4)  Vitamin B12 .... One By Mouth Daily 5)  Levothyroxine Sodium 75 Mcg Tabs (Levothyroxine Sodium) .... One Po Daily 6)  Losartan Potassium-Hctz 100-25 Mg Tabs (Losartan Potassium-Hctz) .[name].. 1 By Mouth Daily 7)  Hydroxyzine Hcl 25 Mg Tabs (Hydroxyzine Hcl) .[name].. 1 By Mouth At Bedtime 8)  Miralax  Powd (Polyethylene Glycol 3350) .... Uad 9)  Edarbi 80 Mg Tabs (Azilsartan Medoxomil) .... Take One A Day 10)  Coumadin 5 Mg Tabs (Warfarin Sodium) .... As Directed 11)  Acetaminophen 500 Mg  Caps (Acetaminophen) .... As Needed 12)  Nexium 40 Mg Cpdr (Esomeprazole Magnesium) .[name].. 1 By Mouth Daily  Allergies (verified): 1)  ! Sulfa 2)  ! Codeine  Past History:  Past Medical History: Htn x 2 years Hypothyroidism Temporal arteritis Renal insufficiency History of pulmonary emboli Mild  mitral regurgitation Probable heart  failure with a well-preserved ejection fraction Cataracts Chronic anemia Paroxysmal atrial fibrillation  Past Surgical History: Reviewed history from 05/02/2008 and no changes required. Temporal Artery BX Cataract Surgery  Family History: Reviewed history from 05/02/2008 and no changes required. There is no early heart disease though her mother died at age 28 apparently of a myocardial infarction. Her father died at 55 of a CVA. She has a sister with hypertension.  Social History: Reviewed history from 05/02/2008 and no changes required. The patient is retired. She is married with 2 grown children, 4 grandchildren and several great children. She has never smoked cigarettes and does not drink alcohol.  Review of Systems       As stated in the HPI and negative for all other systems.   Vital Signs:  Patient profile:   77 year old female Height:      63 inches Weight:      152 pounds BMI:     27.02 Pulse rate:   62 / minute Resp:     18 per minute BP sitting:   178 / 80  (right arm)  Vitals Entered By: Marrion Coy, CNA (December 20, 2009 11:24 AM)  Physical Exam  General:  Well developed, well nourished, in no acute distress. Head:  normocephalic and atraumatic Eyes:  PERRLA/EOM intact; conjunctiva and lids normal. Mouth:  Teeth, gums and palate normal. Oral mucosa normal. Neck:  Neck supple, no JVD. No masses, thyromegaly or abnormal cervical nodes. Chest Wall:  no deformities or breast masses noted Lungs:  Clear  bilaterally to auscultation and percussion. Abdomen:  Bowel sounds positive; abdomen soft and non-tender without masses, organomegaly, or hernias noted. No hepatosplenomegaly. Msk:  Back normal, normal gait. Muscle strength and tone normal. Extremities:  No clubbing or cyanosis. Neurologic:  Alert and oriented x 3. Skin:  Intact without lesions or rashes. Cervical Nodes:  no significant adenopathy Axillary Nodes:  no significant adenopathy Inguinal Nodes:  no  significant adenopathy Psych:  Normal affect.   Detailed Cardiovascular Exam  Neck    Carotids: Carotids full and equal bilaterally without bruits.      Neck Veins: Normal, no JVD.    Heart    Inspection: no deformities or lifts noted.      Palpation: normal PMI with no thrills palpable.      Auscultation: regular rate and rhythm, S1, S2 without murmurs, rubs, gallops, or clicks.    Vascular    Abdominal Aorta: no palpable masses, pulsations, or audible bruits.      Femoral Pulses: normal femoral pulses bilaterally.      Pedal Pulses: normal pedal pulses bilaterally.      Radial Pulses: normal radial pulses bilaterally.      Peripheral Circulation: no clubbing, cyanosis, or edema noted with normal capillary refill.     Impression & Recommendations:  Problem # 1:  MYOCARDIAL PERFUSION SCAN, WITH STRESS TEST, ABNORMAL (ICD-794.39) The patient has significant symptoms with probable ischemia on a stress perfusion study. This may be related to her arrhythmia but if possible I would like to exclude obstructive coronary disease. My plan will be to bring her to the hospital one day next week, gently hydrate her, hold her ARB and reassess for possible coronary artery angiography without LV angiography. At the least I would like to perform a right heart catheterization to evaluate dyspnea. Of course she will need to be on Coumadin which I will stop 3 days prior to admission.  Problem # 2:  RENAL INSUFFICIENCY (ICD-588.9) This will be addressed as above.  Problem # 3:  ATRIAL FIBRILLATION (ICD-427.31) I will reassess with telemetry in the hospital and probably outpatient telemetry.  She will be on coumadin long term.  Problem # 4:  HYPERTENSION, PULMONARY (ICD-416.8) Her blood pressure will continue to be addressed with titration of medications based on findings in the hospital. Certainly diastolic dysfunction exacerbated by hypertension probably contribute to her symptoms as her BNP was  elevated.  Patient Instructions: 1)  Your physician recommends that you schedule a follow-up appointment after heart cath 2)  Your physician recommends that you continue on your current medications as directed. Please refer to the Current Medication list given to you today. 3)  Your physician has requested that you have a cardiac catheterization.  Cardiac catheterization is used to diagnose and/or treat various heart conditions. Doctors may recommend this procedure for a number of different reasons. The most common reason is to evaluate chest pain. Chest pain can be a symptom of coronary artery disease (CAD), and cardiac catheterization can show whether plaque is narrowing or blocking your heart's arteries. This procedure is also used to evaluate the valves, as well as measure the blood flow and oxygen levels in different parts of your heart.  For further information please visit [email or web address]   4)  You will be admitted on Sunday, December 4 for hydration prior to your cardiac cath on Monday December 24, 2009. 5)  Stop your coumadin Thursday.

## 2010-04-01 LAB — BASIC METABOLIC PANEL
BUN: 43 mg/dL — ABNORMAL HIGH (ref 6–23)
BUN: 44 mg/dL — ABNORMAL HIGH (ref 6–23)
CO2: 25 mEq/L (ref 19–32)
CO2: 27 mEq/L (ref 19–32)
Calcium: 8.5 mg/dL (ref 8.4–10.5)
Chloride: 102 mEq/L (ref 96–112)
Chloride: 105 mEq/L (ref 96–112)
Chloride: 107 mEq/L (ref 96–112)
Creatinine, Ser: 2.54 mg/dL — ABNORMAL HIGH (ref 0.4–1.2)
Creatinine, Ser: 2.57 mg/dL — ABNORMAL HIGH (ref 0.4–1.2)
GFR calc Af Amer: 22 mL/min — ABNORMAL LOW (ref >60)
GFR calc non Af Amer: 18 mL/min — ABNORMAL LOW (ref >60)
GFR calc non Af Amer: 20 mL/min — ABNORMAL LOW (ref >60)
Glucose, Bld: 106 mg/dL — ABNORMAL HIGH (ref 70–99)
Glucose, Bld: 110 mg/dL — ABNORMAL HIGH (ref 70–99)
Potassium: 4.9 mEq/L (ref 3.5–5.1)
Potassium: 5 mEq/L (ref 3.5–5.1)
Potassium: 5.6 mEq/L — ABNORMAL HIGH (ref 3.5–5.1)
Sodium: 129 mEq/L — ABNORMAL LOW (ref 135–145)
Sodium: 135 mEq/L (ref 135–145)

## 2010-04-01 LAB — POCT I-STAT 3, VENOUS BLOOD GAS (G3P V)
Bicarbonate: 23.9 mEq/L (ref 20.0–24.0)
O2 Saturation: 55 %
TCO2: 25 mmol/L (ref 0–100)

## 2010-04-01 LAB — CBC
HCT: 27.2 % — ABNORMAL LOW (ref 36.0–46.0)
HCT: 28.4 % — ABNORMAL LOW (ref 36.0–46.0)
HCT: 28.9 % — ABNORMAL LOW (ref 36.0–46.0)
Hemoglobin: 9 g/dL — ABNORMAL LOW (ref 12.0–15.0)
Hemoglobin: 9.6 g/dL — ABNORMAL LOW (ref 12.0–15.0)
Hemoglobin: 9.8 g/dL — ABNORMAL LOW (ref 12.0–15.0)
MCH: 33.9 pg (ref 26.0–34.0)
MCH: 34.3 pg — ABNORMAL HIGH (ref 26.0–34.0)
MCHC: 33.8 g/dL (ref 30.0–36.0)
MCHC: 34.1 g/dL (ref 30.0–36.0)
MCV: 100 fL (ref 78.0–100.0)
MCV: 100.4 fL — ABNORMAL HIGH (ref 78.0–100.0)
MCV: 100.4 fL — ABNORMAL HIGH (ref 78.0–100.0)
MCV: 101 fL — ABNORMAL HIGH (ref 78.0–100.0)
Platelets: 195 10*3/uL (ref 150–400)
Platelets: 230 10*3/uL (ref 150–400)
RBC: 2.72 MIL/uL — ABNORMAL LOW (ref 3.87–5.11)
RBC: 2.83 MIL/uL — ABNORMAL LOW (ref 3.87–5.11)
RBC: 2.86 MIL/uL — ABNORMAL LOW (ref 3.87–5.11)
RDW: 13.3 % (ref 11.5–15.5)
RDW: 13.5 % (ref 11.5–15.5)
WBC: 6 10*3/uL (ref 4.0–10.5)
WBC: 6 10*3/uL (ref 4.0–10.5)
WBC: 6.6 10*3/uL (ref 4.0–10.5)

## 2010-04-01 LAB — POCT I-STAT 3, ART BLOOD GAS (G3+)
O2 Saturation: 92 %
TCO2: 26 mmol/L (ref 0–100)
pCO2 arterial: 42.3 mmHg (ref 35.0–45.0)
pH, Arterial: 7.369 (ref 7.350–7.400)
pO2, Arterial: 67 mmHg — ABNORMAL LOW (ref 80.0–100.0)

## 2010-04-01 LAB — PROTIME-INR
INR: 1.28 (ref 0.00–1.49)
INR: 1.41 (ref 0.00–1.49)
INR: 1.44 (ref 0.00–1.49)
Prothrombin Time: 17.5 seconds — ABNORMAL HIGH (ref 11.6–15.2)
Prothrombin Time: 17.7 seconds — ABNORMAL HIGH (ref 11.6–15.2)

## 2010-04-01 LAB — HEPARIN LEVEL (UNFRACTIONATED): Heparin Unfractionated: <0.1 IU/mL — ABNORMAL LOW (ref 0.30–0.70)

## 2010-04-01 LAB — HAPTOGLOBIN: Haptoglobin: 116 mg/dL (ref 16–200)

## 2010-04-01 LAB — APTT: aPTT: 29 seconds (ref 24–37)

## 2010-04-26 LAB — DIFFERENTIAL
Basophils Relative: 1 % (ref 0–1)
Eosinophils Absolute: 0 10*3/uL (ref 0.0–0.7)
Eosinophils Relative: 0 % (ref 0–5)
Lymphs Abs: 0.5 10*3/uL — ABNORMAL LOW (ref 0.7–4.0)
Monocytes Absolute: 0.2 10*3/uL (ref 0.1–1.0)
Monocytes Relative: 1 % — ABNORMAL LOW (ref 3–12)

## 2010-04-26 LAB — URINALYSIS, ROUTINE W REFLEX MICROSCOPIC
Bilirubin Urine: NEGATIVE
Nitrite: NEGATIVE
Specific Gravity, Urine: 1.007 (ref 1.005–1.030)
Urobilinogen, UA: 0.2 mg/dL (ref 0.0–1.0)
pH: 7 (ref 5.0–8.0)

## 2010-04-26 LAB — URINE MICROSCOPIC-ADD ON

## 2010-04-26 LAB — COMPREHENSIVE METABOLIC PANEL
ALT: 23 U/L (ref 0–35)
AST: 37 U/L (ref 0–37)
Alkaline Phosphatase: 92 U/L (ref 39–117)
CO2: 26 mEq/L (ref 19–32)
Calcium: 9.1 mg/dL (ref 8.4–10.5)
GFR calc Af Amer: 26 mL/min — ABNORMAL LOW (ref >60)
Potassium: 4.5 mEq/L (ref 3.5–5.1)
Sodium: 129 mEq/L — ABNORMAL LOW (ref 135–145)
Total Protein: 7.4 g/dL (ref 6.0–8.3)

## 2010-04-26 LAB — CBC
HCT: 26.1 % — ABNORMAL LOW (ref 36.0–46.0)
Hemoglobin: 9.5 g/dL — ABNORMAL LOW (ref 12.0–15.0)
MCHC: 33.9 g/dL (ref 30.0–36.0)
MCV: 100 fL (ref 78.0–100.0)
RBC: 2.61 MIL/uL — ABNORMAL LOW (ref 3.87–5.11)
RBC: 2.77 MIL/uL — ABNORMAL LOW (ref 3.87–5.11)
RDW: 14.5 % (ref 11.5–15.5)
WBC: 10.8 10*3/uL — ABNORMAL HIGH (ref 4.0–10.5)

## 2010-04-26 LAB — CROSSMATCH: Antibody Screen: NEGATIVE

## 2010-04-26 LAB — URINE CULTURE

## 2010-04-26 LAB — HEMOCCULT GUIAC POC 1CARD (OFFICE): Fecal Occult Bld: NEGATIVE

## 2010-04-29 LAB — CBC
MCHC: 33 g/dL (ref 30.0–36.0)
MCV: 100.8 fL — ABNORMAL HIGH (ref 78.0–100.0)
Platelets: 287 10*3/uL (ref 150–400)
RBC: 2.83 MIL/uL — ABNORMAL LOW (ref 3.87–5.11)

## 2010-04-29 LAB — URINE MICROSCOPIC-ADD ON

## 2010-04-29 LAB — COMPREHENSIVE METABOLIC PANEL
ALT: 23 U/L (ref 0–35)
AST: 36 U/L (ref 0–37)
Albumin: 3.1 g/dL — ABNORMAL LOW (ref 3.5–5.2)
CO2: 28 mEq/L (ref 19–32)
Calcium: 8.9 mg/dL (ref 8.4–10.5)
Creatinine, Ser: 2.37 mg/dL — ABNORMAL HIGH (ref 0.4–1.2)
GFR calc Af Amer: 24 mL/min — ABNORMAL LOW (ref >60)
Sodium: 128 mEq/L — ABNORMAL LOW (ref 135–145)
Total Protein: 6.7 g/dL (ref 6.0–8.3)

## 2010-04-29 LAB — URINALYSIS, ROUTINE W REFLEX MICROSCOPIC
Bilirubin Urine: NEGATIVE
Glucose, UA: NEGATIVE mg/dL
Ketones, ur: NEGATIVE mg/dL
Leukocytes, UA: NEGATIVE
Nitrite: NEGATIVE
Specific Gravity, Urine: 1.01 (ref 1.005–1.030)
pH: 7 (ref 5.0–8.0)

## 2010-04-29 LAB — DIFFERENTIAL
Eosinophils Absolute: 0.1 10*3/uL (ref 0.0–0.7)
Eosinophils Relative: 1 % (ref 0–5)
Lymphocytes Relative: 17 % (ref 12–46)
Lymphs Abs: 1.2 10*3/uL (ref 0.7–4.0)
Monocytes Absolute: 0.8 10*3/uL (ref 0.1–1.0)
Monocytes Relative: 11 % (ref 3–12)

## 2010-04-29 LAB — PROTIME-INR: Prothrombin Time: 29.4 seconds — ABNORMAL HIGH (ref 11.6–15.2)

## 2010-05-01 LAB — DIFFERENTIAL
Basophils Absolute: 0 10*3/uL (ref 0.0–0.1)
Basophils Relative: 0 % (ref 0–1)
Basophils Relative: 0 % (ref 0–1)
Eosinophils Absolute: 0 10*3/uL (ref 0.0–0.7)
Eosinophils Relative: 0 % (ref 0–5)
Eosinophils Relative: 0 % (ref 0–5)
Monocytes Absolute: 0.1 10*3/uL (ref 0.1–1.0)
Monocytes Absolute: 0.1 10*3/uL (ref 0.1–1.0)
Monocytes Relative: 1 % — ABNORMAL LOW (ref 3–12)
Neutro Abs: 12 10*3/uL — ABNORMAL HIGH (ref 1.7–7.7)

## 2010-05-01 LAB — CROSSMATCH

## 2010-05-01 LAB — BASIC METABOLIC PANEL
BUN: 69 mg/dL — ABNORMAL HIGH (ref 6–23)
CO2: 25 mEq/L (ref 19–32)
Glucose, Bld: 130 mg/dL — ABNORMAL HIGH (ref 70–99)
Potassium: 4.6 mEq/L (ref 3.5–5.1)
Sodium: 136 mEq/L (ref 135–145)

## 2010-05-01 LAB — RETICULOCYTES: Retic Ct Pct: 2 % (ref 0.4–3.1)

## 2010-05-01 LAB — CBC
HCT: 23.8 % — ABNORMAL LOW (ref 36.0–46.0)
HCT: 28.1 % — ABNORMAL LOW (ref 36.0–46.0)
HCT: 30.3 % — ABNORMAL LOW (ref 36.0–46.0)
Hemoglobin: 10.5 g/dL — ABNORMAL LOW (ref 12.0–15.0)
Hemoglobin: 8.4 g/dL — ABNORMAL LOW (ref 12.0–15.0)
Hemoglobin: 9.7 g/dL — ABNORMAL LOW (ref 12.0–15.0)
MCHC: 34.4 g/dL (ref 30.0–36.0)
MCHC: 35.1 g/dL (ref 30.0–36.0)
MCV: 95.6 fL (ref 78.0–100.0)
MCV: 99.2 fL (ref 78.0–100.0)
RBC: 2.84 MIL/uL — ABNORMAL LOW (ref 3.87–5.11)
RDW: 12.9 % (ref 11.5–15.5)
RDW: 13.8 % (ref 11.5–15.5)
RDW: 16.2 % — ABNORMAL HIGH (ref 11.5–15.5)

## 2010-05-01 LAB — VITAMIN B12: Vitamin B-12: 1902 pg/mL — ABNORMAL HIGH (ref 211–911)

## 2010-05-01 LAB — POCT I-STAT, CHEM 8
BUN: 63 mg/dL — ABNORMAL HIGH (ref 6–23)
Creatinine, Ser: 2.9 mg/dL — ABNORMAL HIGH (ref 0.4–1.2)
Glucose, Bld: 111 mg/dL — ABNORMAL HIGH (ref 70–99)
Sodium: 132 mEq/L — ABNORMAL LOW (ref 135–145)
TCO2: 26 mmol/L (ref 0–100)

## 2010-05-01 LAB — IRON AND TIBC

## 2010-05-01 LAB — PREPARE RBC (CROSSMATCH)

## 2010-05-02 ENCOUNTER — Telehealth: Payer: Self-pay | Admitting: Cardiology

## 2010-05-02 ENCOUNTER — Other Ambulatory Visit: Payer: Self-pay | Admitting: Cardiology

## 2010-05-02 NOTE — Telephone Encounter (Signed)
Spoke with a [facility] pharmacist. Prescription  Was Verified for  Metoprolol succinate 100 mg, one tablet twice a day.

## 2010-05-02 NOTE — Telephone Encounter (Signed)
Pt needs metropolol.

## 2010-05-20 ENCOUNTER — Other Ambulatory Visit (INDEPENDENT_AMBULATORY_CARE_PROVIDER_SITE_OTHER): Payer: Medicare Other | Admitting: *Deleted

## 2010-05-20 ENCOUNTER — Telehealth: Payer: Self-pay | Admitting: Cardiology

## 2010-05-20 DIAGNOSIS — I5022 Chronic systolic (congestive) heart failure: Secondary | ICD-10-CM

## 2010-05-20 DIAGNOSIS — I272 Pulmonary hypertension, unspecified: Secondary | ICD-10-CM

## 2010-05-20 DIAGNOSIS — R0602 Shortness of breath: Secondary | ICD-10-CM

## 2010-05-20 LAB — BASIC METABOLIC PANEL
BUN: 35 mg/dL — ABNORMAL HIGH (ref 6–23)
Chloride: 96 mEq/L (ref 96–112)
Creatinine, Ser: 2.5 mg/dL — ABNORMAL HIGH (ref 0.4–1.2)
GFR: 19.54 mL/min — ABNORMAL LOW (ref >60.00)
Glucose, Bld: 100 mg/dL — ABNORMAL HIGH (ref 70–99)
Potassium: 5.1 mEq/L (ref 3.5–5.1)

## 2010-05-20 NOTE — Telephone Encounter (Signed)
I will need to see a BMET and BNP before suggesting a diuretic dose.  Her last creat was 2.5.

## 2010-05-20 NOTE — Telephone Encounter (Signed)
Pt aware of to have lab work

## 2010-05-20 NOTE — Telephone Encounter (Signed)
Per daughter  States pt having more trouble with SOB at night.  She does also report SOB with activity but doesn't feel like this have changed much.  She is raising the head of her hospital bed at night to sleep.  BP last week was 116/60.  She has not edema weight gain or cough.  Daughter states that Dr Antoine Poche told them at her last office visit that if her SOB got worse he would call something in for her.  Instructed daughter I will refer information to Dr Antoine Poche for his review and orders.  She is in agreement.

## 2010-05-20 NOTE — Telephone Encounter (Signed)
C/o sob at night.

## 2010-05-27 ENCOUNTER — Telehealth: Payer: Self-pay | Admitting: Cardiology

## 2010-05-27 NOTE — Telephone Encounter (Signed)
Per daughter [name] - pt doesn't remember discussing results of blood work with me.  [name] is aware of results and that no changes are needed at this time.

## 2010-05-27 NOTE — Telephone Encounter (Signed)
Pt daughter calling re pt blood work results.

## 2010-06-04 NOTE — H&P (Signed)
Cheryl [name], Cheryl [name]            ACCOUNT NO.:  [id number]   MEDICAL RECORD NO.:  [id number]          PATIENT TYPE:  INP   LOCATION:  1833                         FACILITY:  [facility]   PHYSICIAN:  Eduard Clos, MDDATE OF BIRTH:  11/10/1925   DATE OF ADMISSION:  04/26/2008  DATE OF DISCHARGE:                              HISTORY & PHYSICAL   PRIMARY CARE PHYSICIAN:  Dr. Purnell Shoemaker at [facility] [place].   CHIEF COMPLAINT:  Referred for abnormal labs.   HISTORY OF PRESENT ILLNESS:  An 77 year old female with history of  pulmonary embolism in 1997 on Coumadin, history of diastolic CHF, recent  2-D echo was on March 09, 2008 with EF for 55-65%, hypertension,  hypothyroidism, presented to the ER because her primary care physician  referred because of low hemoglobin.  On admission, the patient was found  to have a hemoglobin of 8.4.  Guaiac was negative.  The patient did have  a recent fall when she tripped from a stool and hit head on April 22, 2008 and at that time she had significant blood from a scalp injury as  per the patient and the patient's daughter.  The patient's daughter  states she was in a pool of blood.  She was on Coumadin at that time and  INR was 3.6, supra-therapeutic.  Today it is 1.5.  The patient has been  taking long-time Coumadin for a PE.  We do not know how significant the  PE was at that time, but the patient is wanting to continue her  Coumadin.   The patient does complain of some fatigue but denies any chest pain,  shortness of breath, nausea, vomiting, fever or chills, abdominal pain,  dysuria, discharges or diarrhea.  The patient is on doxycycline for  recent bronchitis for which her PCP prescribed a total course of 15  days.  She is still in the middle of the course.  Is also on steroids  for her retinal problems as prescribed by her ophthalmologist.   PAST MEDICAL HISTORY:  Hypertension, diastolic CHF, hypothyroidism,  history of PE on Coumadin.   PAST  SURGICAL HISTORY:  Right-sided vitrectomy and hysterectomy.   MEDICATIONS ON ADMISSION:  1. Coumadin as advised.  2. Alprazolam 0.25 mg p.o. q.6 p.r.n. anxiety.  3. Levothyroxine 75 mcg p.o. daily.  4. Metoprolol ER 50 mg p.o. in a.m. and 100 in p.m.  5. Cozaar 100 mg p.o. daily.  6. Vitamin D 400 international units p.o. daily.  7. MiraLax 17 grams p.o. daily.  8. Multivitamin 1 tablet p.o. daily.  9. Tylenol 650 mg p.o. q.6 p.r.n. pain.  10.Lasix 20 mg daily.  11.Doxycycline 100 mg p.o. b.i.d. for 7 more days.  12.Prednisone tapering dose and to continue 8 mg thereafter.   ALLERGIES:  SULFA and CODEINE.   SOCIAL HISTORY:  The patient at this time lives with her daughter as  husband has recently passed away 3 weeks ago.  Denies smoking  cigarettes, drinking alcohol, using illegal drugs.   REVIEW OF SYSTEMS:  As per history of present illness.  Nothing else  significant.   EXAMINATION:  CONSTITUTIONAL:  The patient examined bedside not in acute  distress.  VITAL SIGNS:  Blood pressure is 175/69, pulse 55 per minute, temperature  98, respiration 18, O2 sat 100%.  HEENT:  There are staples on the scalp and on multiple areas.  PERRLA,  positive anicteric.  No pallor.  No facial asymmetry.  CHEST:  Bilateral air entry present.  No rhonchi or crepitation.  HEART:  S1, S2 heard.  ABDOMEN:  Soft and nontender.  Bowel sounds heard.  CNS:  The patient is alert, awake, oriented to time, place, and person.  Moves upper and lower extremities 5/5.  EXTREMITIES:  Peripheral pulses felt.  No edema.   LABS:  Recent CT head and CT C spine all are negative for any fractures  of acute findings, or hemorrhage.  CBC today on April 26, 2008 shows WBC  of 13.9, hemoglobin 8.4, hematocrit 23.8, platelets 59,000, neutrophils  94%.  PT/INR 18.8 and 1.5.  Basic metabolic panel sodium 136, potassium  4.6, chloride 100, carbon dioxide 25, glucose 130, BUN 69, creatinine  2.9, calcium 9.1.  Stool for  occult blood is negative.   ASSESSMENT:  1. Anemia probably from the recent blood loss due to scalp injury plus      secondary to chronic kidney disease.  2. History of pulmonary embolism on Coumadin.  3. Chronic kidney disease.  4. History of diastolic heart failure.  5. Hypertension.  6. Hypothyroidism.   PLAN:  Admit the patient to medical floor.  The patient has already  received 1 unit of packed blood cells.  Will transfuse 1 more unit.  The  patient will have anemia profile studies done and once the hemoglobin is  more stable will probably consider discharging home tomorrow, as the  patient has no stool guaiac positive.  Have advised the patient about  colonoscopy.  The patient probably will need close followup on a  creatinine, and will probably need Epogen eventually, and will need  further workup on her anemia as outpatient with a primary care  physician.  Also, the patient at this time wants to continue her  Coumadin for PE and the patient will need to discuss this in length with  her primary care physician.      Eduard Clos, MD  Electronically Signed     ANK/MEDQ  D:  04/26/2008  T:  04/26/2008  Job:  [id number]

## 2010-06-04 NOTE — Discharge Summary (Signed)
NAMEMARKEIA, Cheryl [name]            ACCOUNT NO.:  [id number]   MEDICAL RECORD NO.:  [id number]          PATIENT TYPE:  INP   LOCATION:  6741                         FACILITY:  [facility]   PHYSICIAN:  Gardiner Barefoot, MD    DATE OF BIRTH:  1925/03/06   DATE OF ADMISSION:  04/26/2008  DATE OF DISCHARGE:  04/27/2008                               DISCHARGE SUMMARY   PRIMARY CARE PHYSICIAN:  Lianne Bushy, M.D. at [facility].   HISTORY OF PRESENT ILLNESS:  This is an 77 year old female on chronic  Coumadin for pulmonary embolism in 1997 who came in for a low hemoglobin  and she was sent in from her primary care physician.  Hemoglobin was  reportedly 8.4.  She was, however, guaiac negative.  Of note, on April  3, she did have a scalp injury from a fall and was seen in the emergency  room here as well.  However, CT scan at times showed no acute  intracranial injury and she was stapled and sent home at that time.  Likely blood loss is from the fall.   DISCHARGE DIAGNOSES:  1. Anemia status post acute blood loss.  2. Hypertension.  3. Diastolic congestive heart failure.  4. Hypothyroidism.  5. History of pulmonary embolism on chronic Coumadin.   MEDICATIONS AT DISCHARGE:  These are all unchanged and include:  1. Alprazolam 0.25 mg p.o. q.6 p.r.n.  2. Levothyroxine 75 mcg daily.  3. Metoprolol ER 50 mg 1 q.a.m. and 2 q.p.m.  4. Cozaar 100 mg daily.  5. Coumadin 5 mg as directed.  6. Vitamin D 400 IU daily.  7. MiraLax 17 g daily.  8. Multivitamin daily.  9. Tylenol p.r.n.  10.Lasix 20 mg p.o. daily.  11.Doxycycline 100 mg p.o. daily for recent bronchitis exacerbation.  12.Methylprednisolone 4-mg tabs by sliding scale for acute bronchitis.   HOSPITAL COURSE:  The patient was admitted to the hospital for blood  transfusion.  Her hemoglobin on discharge was 10.5 which was up from her  initial hemoglobin of 8.4.  The patient was eager to go home and there  is no acute intervention that is now  required.  She has instructions to  have the staples removed from her scalp on Saturday in the emergency  room.  She also is going to follow up with her primary care physician to  continue her Coumadin in his clinic on Monday following this discharge.  The patient was instructed to continue her home dose of Coumadin,  however.  I do suspect though that her INR levels will be mildly  decreased, however with the acute bleed, I do not feel she needs to be  therapeutic until at least Monday which would be a little bit over a  week after her  fall to make sure there is no continued acute bleeding particularly in  light of the acute blood loss she suffered.  Also, a lab checked  included folate and B12 which were normal as well as reticulocyte count.  The patient discharged in good condition and has good family support.      Gardiner Barefoot, MD  Electronically Signed     RWC/MEDQ  D:  04/27/2008  T:  04/28/2008  Job:  [id number]

## 2010-06-07 NOTE — Discharge Summary (Signed)
NAMEALEYSSA, PIKE            ACCOUNT NO.:  [id number]   MEDICAL RECORD NO.:  [id number]          PATIENT TYPE:  INP   LOCATION:  6732                         FACILITY:  [facility]   PHYSICIAN:  Santiago Bumpers. Hensel, M.D.DATE OF BIRTH:  23-May-1925   DATE OF ADMISSION:  09/20/2004  DATE OF DISCHARGE:  09/22/2004                                 DISCHARGE SUMMARY   DISCHARGE DIAGNOSES:  1.  Hyponatremia  2.  Dehydration.  3.  Hypothyroidism.  4.  Mild congestive heart failure.  5.  Hypertension.  6.  History of pulmonary embolism on chronic anticoagulation.  7.  History of hematuria and proteinuria.   DISCHARGE MEDICATIONS:  1.  Metoprolol 100 mg daily.  2.  Levothroid 75 mcg daily.  3.  Estradiol 1 mg daily.  4.  Coumadin alternating with 5 mg and 2.5 mg.  5.  Allegra 180 daily.  6.  Prilosec 20 mg daily.  7.  Milk of magnesia p.r.n.  8.  Benefiber one tablet each day.  Of note hydrochlorothiazide was discontinued during this hospitalization.   DISPOSITION:  The patient doing well, active, and discharged to home.   CONSULTATIONS:  None.   PROCEDURES:  Chest x-ray which showed COPD otherwise non acute disease.   BRIEF ADMISSION HISTORY:  This is a 77 year old white female who was sent  from Dr. Harlene Salts office with a sodium of 115, the patient had not had any  seizures, no lethargy, she was nauseated with abdominal pain and fatigued.  The patient had not been feeling well days prior and had decreased p.o.  intake.   ADMISSION LABS:  Sodium 123, potassium 3.7, chloride 83, bicarb 29, glucose  63, BUN 34, creatinine 1.3, calcium 8.9, urinalysis had a specific gravity  of 1.037 with small blood and 100 protein, otherwise negative.   HOSPITAL COURSE:  1.  Hyponatremia.  A number of things were in the differential and ruled out      during hospitalization, it was decided likely multifactorial with      decreased p.o. intake, increased water intake, and use of  hydrochlorothiazide.  The patient's sodium did improve with hydration,      __________ that hydrochlorothiazide however, it is not within normal      limits, her discharge sodium is 128.  Hypothyroidism was considered, the      patient's TSH was done and within normal limits.  Cortisol level was      done which was within normal limits, to rule out adrenal insufficiency      since the patient had been recently placed on steroids and coming off of      them.  No other obvious signs or things leading to hyponatremia were      noted.  2.  Leukocytosis.  The patient's white blood cell count at Dr. Harlene Salts      office was 16.2, on the day of admission, sources of infection were      ruled out with blood and urine cultures and chest x-ray.  Chest x-ray      showed no infiltrate, blood cultures were no growth at  the time of      discharge and urine culture was negative.  White blood cell count on      September 21, 2004 was 12.6.  Question if mild leukocytosis was secondary      to steroid usage.  3.  Weakness resolved with hydration.  The patient at baseline at the time      of discharge.  4.  Other medical problems stable during hospitalization.  No alterations      made to her medications except what was noted above.   DISCHARGE LABS:  Repeat urinalysis had a specific gravity of 1.006 with  moderate blood and 30 protein, otherwise negative.  BMET at the time of  discharge showed a sodium of 128, potassium 4.2, chloride 98, bicarb 23,  glucose 102, BUN 28 and creatinine 1.3.  The patient's sodium should be  rechecked on Tuesday September 24, 2004.     ______________________________  Dellis Anes    ______________________________  Santiago Bumpers. Leveda Anna, M.D.    AW/MEDQ  D:  09/22/2004  T:  09/22/2004  Job:  [id number]   cc:   Lianne Bushy, MD  Fax Copy by 09/24/04

## 2010-06-07 NOTE — H&P (Signed)
NAMECOLLEN, HOSTLER            ACCOUNT NO.:  [id number]   MEDICAL RECORD NO.:  [id number]          PATIENT TYPE:  INP   LOCATION:  6732                         FACILITY:  [facility]   PHYSICIAN:  Madeleine B. Vanstory, M.D.DATE OF BIRTH:  09/25/1925   DATE OF ADMISSION:  09/20/2004  DATE OF DISCHARGE:                                HISTORY & PHYSICAL   ADMISSION DIAGNOSIS:  Hyponatremia.   HISTORY OF PRESENT ILLNESS:  This 77 year old female sent from [facility] with a sodium of 115.  The patient denies lethargy, no seizure  activity, has had some nausea - now resolved, has had some abdominal pain -  now resolved, has been fatigued x 3-4 weeks, denies headache.  The patient  is hypothyroid.  She takes Levothroid, is compliant.  The patient also on  triamterene and hydrochlorothiazide, was seen at [facility], on September 05, 2004,  with a sodium of 123, not treated, was seen, on September 20, 2004, with 115.  The patient is on steroid taper for a gout flare at a dose of 3 mg  currently.  The patient was also found to have an elevated white blood count  at 16.5.  BNP 122.  Chest CT, done today, within normal limits as well as an  abdominal ultrasound.  Mammogram, on March 2006, was within normal limits.   PAST MEDICAL HISTORY:  1.  DJD of the right hip.  2.  Hypothyroidism.  3.  Allergies.  4.  Anxiety.  5.  Edema.  6.  Gout.  7.  Mild CHF with cardiomegaly.  8.  Anemia, baseline 11-12.  9.  Hypertension.  10. History of PE in 1997.  11. History of hematuria and proteinuria which is chronic.   MEDICATION HISTORY:   ALLERGIES:  1.  SULFA.  2.  CODEINE give her GI symptoms.   MEDICATIONS:  1.  Estradiol 1 mg p.o. every day.  2.  Levothroid 0.75 mg p.o. every day.  3.  Coumadin 5 mg p.o. Monday and Friday, 2.5 mg other days.  4.  Toprol XL 100 mg p.o. every day.  5.  Triamterine/hydrochlorothiazide 70/50, one half to one p.r.n.  6.  Milk of Magnesia p.r.n.  7.   Benefiber one tab p.o. every day.  8.  Allegra 180 mg p.o. every day.  9.  Prilosec every day.  10. Medrol taper 3 mg, then 3 mg, then 2, then 2, then 1, then 1.  11. Colchicine p.r.n. for flares.   SOCIAL HISTORY:  She is married, able to perform her ADLs, lives with  husband, nonsmoker, nondrinker.   FAMILY HISTORY:  Dad died with a CVA.  He had hypertension.  Mom died of an  MI in her 42s.   REVIEW OF SYSTEMS:  Negative for weight loss, fevers, chills, vomiting,  chest pain, dysuria.  Positive for shortness of breath which is chronic, at  night she sleeps with two pillows and elevated bed.  She is also positive  for constipation which is chronic, treated with over the counter.  Her  nausea and abdominal pain which she has had for the  past seven days is  currently resolved.   PHYSICAL EXAMINATION:  VITAL SIGNS:  Temperature 96.3, heart rate 64,  respiratory rate 18, blood pressure 147/76, 98% on room air.  GENERAL:  This is an elderly Caucasian female in no acute distress, alert  and oriented x3.  HEENT:  Pupils equally round, reactive to light.  Extraocular muscles  intact.  Glasses.  Moist mucous membranes.  NECK:  No JVD.  No thyromegaly.  PULMONARY:  Clear to auscultation bilaterally.  No crackles appreciated.  CARDIOVASCULAR:  Regular rate and rhythm.  No murmurs, rubs, or gallops.  Peripheral pulses 2+.  ABDOMEN:  Soft, nontender, nondistended.  Positive bowel sounds.  MUSCULOSKELETAL:  Strength 4+ bilaterally.  EXTREMITIES:  No edema appreciated.   LABS:  At [facility], the patient had a sodium of 115 today and that is down from  123 on the 17th.  Potassium of 4.8, glucose 117.  White blood count 16.5.  Chest CT and ultrasound of the abdomen were within normal limits.  TSH, B-  MET, serum osmolality, urine osmolality, creatinine, urine sodium  concentration are all pending.  Chest x-ray pending.   ASSESSMENT/PLAN:  This is a 77 year old Caucasian female with:  1.   Weakness.  A three to four-week history could be secondary to decreased      oral intake or a vitamin deficiency.  She has no focal weakness, no      neurological deficits.  It is likely secondary to number two.  2.  Hyponatremia.  The patient is with a sodium of 115, down from 123 at      [facility].  No seizure, no altered mental status, no lethargy, no headache,      has complained of nausea in the past which is currently resolved.  We      will repeat a B-MET as well as serum and urine osmolality.  Differential      includes adrenal insufficiency.  Her potassium is normal at 4.8.  The      patient is on Medrol.  Hyperglycemia which her glucose is 117 so this is      unlikely.  Syndrome of inappropriate antidiuretic hormone is a      possibility.  Serum osmolality and urine osmolality are pending      currently.  This could be a small cell carcinoma in the differential but      the chest CT was within normal limits.  Chest x-ray pending.      Hypothyroid.  We will check a TSH.  The patient is compliant with her      medications.  This could also be due to hypervolemia but the patient      does not appear volume over loaded.  The treatment will depend on serum      osmolality study.  We will also treat with normal saline at 100 cc/hr      with a recheck of her sodium six hours to prevent too rapid of a      correction.  We will admit to a regular bed for observation.  3.  White blood cell count elevated likely secondary to steroids.  There is      __________  to be signs or symptoms of infection but we will check a UA,      urine culture, blood cultures x2, and a chest x-ray.  4.  History of pulmonary embolism.  The patient is on chronic Coumadin, can      not  find in medical records why she is currently still on Coumadin, when      she reports the pulmonary embolism was in 1997.  We will order old      charts to the floor and check an INR.  5.  Mild congestive heart failure.  Unknown ejection  fraction and no signs     of edema or volume overload.  We will consider an echo if status      worsens.  We will continue her beta-blocker and hold her      triamterene/hydrochlorothiazide secondary to number two.  6.  Hypothyroid.  Continue Levothroid.  Check TSH.  7.  Hypertension.  Continue Toprol.      Tawnya Crook Erenest Rasher, M.D.     MBV/MEDQ  D:  09/20/2004  T:  09/20/2004  Job:  [id number]

## 2010-06-07 NOTE — Op Note (Signed)
[name], Cheryl [name]            ACCOUNT NO.:  [id number]   MEDICAL RECORD NO.:  [id number]          PATIENT TYPE:  AMB   LOCATION:  SDS                          FACILITY:  [facility]   PHYSICIAN:  Alford Highland. Rankin, M.D.   DATE OF BIRTH:  Aug 31, 1925   DATE OF PROCEDURE:  03/18/2005  DATE OF DISCHARGE:                                 OPERATIVE REPORT   PREOPERATIVE DIAGNOSES:  1.  Severe vision loss, right eye.  2.  Asteroid hyalosis, right eye.   POSTOPERATIVE DIAGNOSES:  1.  Severe vision loss, right eye.  2.  Asteroid hyalosis, right eye.  3.  Posterior capsule opacification, right eye.   OPERATION PERFORMED:  1.  Posterior vitrectomy, right eye.  2.  Surgical posterior capsulotomy, right eye.   SURGEON:  Alford Highland. Rankin, M.D.   ANESTHESIA:  Local retrobulbar with monitored anesthesia control.   INDICATIONS FOR PROCEDURE:  The patient is a 77 year old woman who has  profound vision loss and is disabled with a reading ability with dense  asteroid localized over the macular region.  The patient understands this is  an attempt to remove the vitreous opacification so the patient has a chance  to improve her functioning and not be hampered with her activities of daily  living.  She understands the risks of anesthesia including the rare  occurrence of death but also to the eye including but not limited to  hemorrhage, infection, scarring, need for another surgery, no change in  vision, loss of vision and progression of disease despite intervention.  After appropriate signed consent was obtained, the patient was taken to the  operating room.   DESCRIPTION OF PROCEDURE:  In the operating room, appropriate monitoring was  followed by mild sedation.  0.75% Marcaine was delivered 5 mL retrobulbar  followed by an additional 5 mL laterally in the fashion of modified Darel Hong.  The right periocular region was sterilely prepped and draped in the  usual ophthalmic fashion.  A lid speculum was  applied.  25 gauge vitrectomy  was then begun using trocar system.  The infusion placed, superior trocar  placed.  Core vitrectomy was then begun.  Posterior vitreous detachment was  induced using active suction.   The vitreous skirt was then elevated and trimmed 360 degrees.  Vitreous  circumcised 360 degrees to the vitreous base.  No complications occurred.  It should be noted that surgical posterior capsulotomy was required because  of the visualization peripherally.   At this time the retina was attached.  There were no holes or tears.  The  instruments and trocars were removed from the eye.  A sterile patch and Fox  shield were applied to the right eye.  The patient tolerated the procedure  well without complications.      Alford Highland Rankin, M.D.  Electronically Signed     GAR/MEDQ  D:  03/18/2005  T:  03/19/2005  Job:  [id number]

## 2010-06-25 ENCOUNTER — Encounter: Payer: Self-pay | Admitting: Cardiology

## 2010-07-22 ENCOUNTER — Ambulatory Visit (INDEPENDENT_AMBULATORY_CARE_PROVIDER_SITE_OTHER): Payer: Medicare Other | Admitting: Cardiology

## 2010-07-22 ENCOUNTER — Encounter: Payer: Self-pay | Admitting: Cardiology

## 2010-07-22 DIAGNOSIS — I4891 Unspecified atrial fibrillation: Secondary | ICD-10-CM

## 2010-07-22 DIAGNOSIS — I080 Rheumatic disorders of both mitral and aortic valves: Secondary | ICD-10-CM

## 2010-07-22 DIAGNOSIS — I10 Essential (primary) hypertension: Secondary | ICD-10-CM

## 2010-07-22 MED ORDER — METOPROLOL TARTRATE 100 MG PO TABS: 100.0000 mg | ORAL_TABLET | Freq: Two times a day (BID) | ORAL | Status: DC

## 2010-07-22 NOTE — Progress Notes (Signed)
HPI The patient presents for followup of atrial fibrillation. Since I last saw her she's had continued problems apparent temporal arteritis and she is on a course of steroids. She notes her blood pressure is running up. She can't sleep and she feels anxious. She's not had any palpitations, presyncope or syncope. She denies chest pressure, neck or arm discomfort. She is having no new shortness of breath, PND or orthopnea. She does have some mild chronic ankle edema no weight gain.  Allergies  Allergen Reactions  . Codeine   . Sulfonamide Derivatives     Current Outpatient Prescriptions  Medication Sig Dispense Refill  . acetaminophen (TYLENOL) 325 MG tablet as needed.        [name] amLODipine (NORVASC) 5 MG tablet Take 5 mg by mouth daily.        . Cholecalciferol (VITAMIN D) 400 UNITS capsule Take 400 Units by mouth 2 (two) times daily.        [name] levothyroxine (SYNTHROID, LEVOTHROID) 75 MCG tablet Take 75 mcg by mouth daily.        . metoprolol (TOPROL-XL) 100 MG 24 hr tablet TAKE 1 TABLET TWICE A DAY  60 tablet  1  . multivitamin (THERAGRAN) per tablet Take 1 tablet by mouth daily.        [name] omeprazole (PRILOSEC) 20 MG capsule Take 20 mg by mouth daily.        . polyethylene glycol powder (GLYCOLAX/MIRALAX) powder UAD       . predniSONE (DELTASONE) 10 MG tablet Take 10 mg by mouth as directed.        . warfarin (COUMADIN) 5 MG tablet as directed.        [name] DISCONTD: Acetaminophen 500 MG coapsule as needed.        [name] DISCONTD: diphenhydramine-acetaminophen (TYLENOL PM EXTRA STRENGTH) 25-500 MG TABS Take by mouth at bedtime as needed.        [name] DISCONTD: hydrOXYzine (ATARAX) 25 MG tablet at bedtime.          Past Medical History  Diagnosis Date  . HTN (hypertension)     2 YEARS  . Hypothyroidism   . Temporal arteritis   . Renal insufficiency   . Pulmonary embolism   . Mitral regurgitation     MILD  . Heart failure     PROBABLE WITH A WELL- PRESERVED EJRCTION FRACTION  . Cataracta   .  Chronic anemia   . Paroxysmal atrial fibrillation   . Pulmonary HTN     MOD  . S/P cardiac cath     RT.HRT CATH 12/2009: RA MEAN 12, RV 45/11,PA 47/21,PCWP MEAN 26, CO 4.56, CI 2.7 ABNORMAL MYOVIEW 12/2009  . Ischemia     ANTERIOR,EF 75%----NO CATH DONE DUE TO CKD    Past Surgical History  Procedure Date  . Temporal arteny bx   . Cataract surgery     ROS:  Anxious, insomnia.  Otherwise as stated in the HPI and negative for all other systems.  PHYSICAL EXAM BP 163/72  Pulse 52  Resp 16  Ht 5\' 2"  (1.575 m)  Wt 153 lb (69.4 kg)  BMI 27.98 kg/m2 GENERAL:  Well appearing HEENT:  Pupils equal round and reactive, fundi not visualized, oral mucosa unremarkable, upper dentures NECK:  No jugular venous distention, waveform within normal limits, carotid upstroke brisk and symmetric, no bruits, no thyromegaly LYMPHATICS:  No cervical, inguinal adenopathy LUNGS:  Clear to auscultation bilaterally BACK:  No CVA tenderness CHEST:  Unremarkable HEART:  PMI  not displaced or sustained,S1 and S2 within normal limits, no S3, no S4, no clicks, no rubs, no murmurs ABD:  Flat, positive bowel sounds normal in frequency in pitch, no bruits, no rebound, no guarding, no midline pulsatile mass, no hepatomegaly, no splenomegaly EXT:  2 plus pulses throughout, no edema, no cyanosis no clubbing SKIN:  No rashes no nodules NEURO:  Cranial nerves II through XII grossly intact, motor grossly intact throughout PSYCH:  Cognitively intact, oriented to person place and time   EKG:  Sinus rhythm, first degree AV block, incomplete right bundle branch block  ASSESSMENT AND PLAN

## 2010-07-22 NOTE — Assessment & Plan Note (Signed)
I reviewed her echo from January and she had only mild regurgitation. No change in therapy is indicated.

## 2010-07-22 NOTE — Assessment & Plan Note (Signed)
She has had no symptomatic paroxysms. She tolerates Coumadin. No change in therapy is indicated.

## 2010-07-22 NOTE — Patient Instructions (Signed)
Please keep a blood pressure diary Change Metoprolol Succinate to Metoprolol Tartrate 100 mg twice a day You may use Melatonin for sleep as needed Continue all other medications as listed Follow up in 6 months with Dr Antoine Poche.  You will receive a letter in the mail 2 months before you are due.  Please call us when you receive this letter to schedule your follow up appointment.

## 2010-07-22 NOTE — Assessment & Plan Note (Addendum)
The blood pressure continues to be high. I have instructed the patient to record a blood pressure diary and recording this. This will be presented for my review and pending these results I will make further suggestions about changes in therapy for optimal blood pressure control.  I will change her to short acting metoprolol since her insurance will not pay for the XL.

## 2010-08-22 ENCOUNTER — Other Ambulatory Visit: Payer: Self-pay | Admitting: Family Medicine

## 2010-08-22 DIAGNOSIS — Z1231 Encounter for screening mammogram for malignant neoplasm of breast: Secondary | ICD-10-CM

## 2010-09-03 ENCOUNTER — Ambulatory Visit
Admission: RE | Admit: 2010-09-03 | Discharge: 2010-09-03 | Disposition: A | Discharge status: Home / Self Care | Payer: Medicare Other | Source: Ambulatory Visit | Attending: Family Medicine | Admitting: Family Medicine

## 2010-09-03 ENCOUNTER — Ambulatory Visit: Payer: Medicare Other

## 2010-09-03 DIAGNOSIS — Z1231 Encounter for screening mammogram for malignant neoplasm of breast: Secondary | ICD-10-CM

## 2010-10-18 ENCOUNTER — Encounter: Payer: Self-pay | Admitting: Cardiology

## 2011-01-15 ENCOUNTER — Ambulatory Visit (INDEPENDENT_AMBULATORY_CARE_PROVIDER_SITE_OTHER): Payer: Medicare Other | Admitting: Cardiology

## 2011-01-15 ENCOUNTER — Encounter: Payer: Self-pay | Admitting: Cardiology

## 2011-01-15 DIAGNOSIS — I4891 Unspecified atrial fibrillation: Secondary | ICD-10-CM

## 2011-01-15 DIAGNOSIS — I10 Essential (primary) hypertension: Secondary | ICD-10-CM

## 2011-01-15 DIAGNOSIS — I080 Rheumatic disorders of both mitral and aortic valves: Secondary | ICD-10-CM

## 2011-01-15 DIAGNOSIS — I2789 Other specified pulmonary heart diseases: Secondary | ICD-10-CM

## 2011-01-15 MED ORDER — METOPROLOL SUCCINATE ER 25 MG PO TB24: ORAL_TABLET | ORAL | Status: DC

## 2011-01-15 NOTE — Assessment & Plan Note (Signed)
Her blood pressure is controlled. She will continue the medicines as listed.

## 2011-01-15 NOTE — Assessment & Plan Note (Signed)
She had an echocardiogram earlier this year. She has mild mitral regurgitation. No change in therapy is indicated.

## 2011-01-15 NOTE — Patient Instructions (Addendum)
Your physician wants you to follow-up in: 6 MONTHS You will receive a reminder letter in the mail two months in advance. If you don't receive a letter, please call our office to schedule the follow-up appointment.   TAKE METOPROLOL SUCC 25 MG 1/2 TABLET AS NEEDED FOR ATRIAL FIB

## 2011-01-15 NOTE — Assessment & Plan Note (Signed)
She has mild pulmonary hypertension by echo. No change in therapy is suggested.

## 2011-01-15 NOTE — Progress Notes (Signed)
HPI The patient presents for followup of atrial fibrillation. Since I last saw her she has had increased atrial fibrillation.  She thinks she's had about 7 episodes. They might last for about 4 hours at a time. She gets anxious with these. She feels her heart beating fast. She did not have presyncope or syncope. She does not have chest pressure, neck or arm discomfort. She otherwise denies any new symptoms such as a new PND or orthopnea. She's had no weight gain or edema.  Allergies  Allergen Reactions  . Codeine   . Sulfonamide Derivatives     Current Outpatient Prescriptions  Medication Sig Dispense Refill  . acetaminophen (TYLENOL) 325 MG tablet as needed.        [name] amLODipine (NORVASC) 5 MG tablet Take 5 mg by mouth daily.        . Cholecalciferol (VITAMIN D) 400 UNITS capsule Take 400 Units by mouth 2 (two) times daily.        [name] levothyroxine (SYNTHROID, LEVOTHROID) 75 MCG tablet Take 75 mcg by mouth daily.        . Melatonin 5 MG TABS Take 1 tablet by mouth at bedtime.        . metoprolol (LOPRESSOR) 100 MG tablet Take 1 tablet (100 mg total) by mouth 2 (two) times daily.  60 tablet  11  . mirtazapine (REMERON) 15 MG tablet Take 15 mg by mouth at bedtime.        . multivitamin (THERAGRAN) per tablet Take 1 tablet by mouth daily.        [name] omeprazole (PRILOSEC) 20 MG capsule Take 20 mg by mouth daily.        . polyethylene glycol powder (GLYCOLAX/MIRALAX) powder UAD       . warfarin (COUMADIN) 5 MG tablet as directed.          Past Medical History  Diagnosis Date  . HTN (hypertension)     2 YEARS  . Hypothyroidism   . Temporal arteritis   . Renal insufficiency   . Pulmonary embolism   . Mitral regurgitation     MILD  . Heart failure     PROBABLE WITH A WELL- PRESERVED EJRCTION FRACTION  . Cataracta   . Chronic anemia   . Paroxysmal atrial fibrillation   . Pulmonary HTN     MOD  . S/P cardiac cath     RT.HRT CATH 12/2009: RA MEAN 12, RV 45/11,PA 47/21,PCWP MEAN 26, CO  4.56, CI 2.7 ABNORMAL MYOVIEW 12/2009  . Ischemia     ANTERIOR,EF 75%----NO CATH DONE DUE TO CKD    Past Surgical History  Procedure Date  . Temporal arteny bx   . Cataract surgery     ROS:  Anxious, insomnia.  Otherwise as stated in the HPI and negative for all other systems.  PHYSICAL EXAM BP 142/68  Pulse 64  Ht 5\' 2"  (1.575 m)  Wt 154 lb (69.854 kg)  BMI 28.17 kg/m2 GENERAL:  Well appearing HEENT:  Pupils equal round and reactive, fundi not visualized, oral mucosa unremarkable, upper dentures NECK:  No jugular venous distention, waveform within normal limits, carotid upstroke brisk and symmetric, no bruits, no thyromegaly LYMPHATICS:  No cervical, inguinal adenopathy LUNGS:  Clear to auscultation bilaterally BACK:  No CVA tenderness CHEST:  Unremarkable HEART:  PMI not displaced or sustained,S1 and S2 within normal limits, no S3, no S4, no clicks, no rubs, no murmurs ABD:  Flat, positive bowel sounds normal in frequency in pitch,  no bruits, no rebound, no guarding, no midline pulsatile mass, no hepatomegaly, no splenomegaly EXT:  2 plus pulses throughout, no edema, no cyanosis no clubbing SKIN:  No rashes no nodules NEURO:  Cranial nerves II through XII grossly intact, motor grossly intact throughout [facility]:  Cognitively intact, oriented to person place and time   EKG:  Sinus rhythm, first degree AV block, incomplete right bundle branch block, rate 64.  01/15/2011   ASSESSMENT AND PLAN

## 2011-01-15 NOTE — Assessment & Plan Note (Signed)
We had a long discussion about this today. We discussed possible treatment with amiodarone. She would rather defer this as she doesn't think her symptoms are that limiting. We will use an extra dose of metoprolol 12.5 mg a she's having symptoms. She'll also take 1 of her benzodiazepines. If she starts having increased frequency or severity of episodes we will go to amlodipine.

## 2011-02-12 ENCOUNTER — Telehealth: Payer: Self-pay | Admitting: Cardiology

## 2011-02-12 NOTE — Telephone Encounter (Signed)
New Problem:     A representative for the patient called in because Cheryl [name]'s insurance sent her a letter stating that they would no longer pay for one of her medications.  The representative refused to give their name and specify what the name of the medication was. They stated that they would try calling back tomorrow afternoon.

## 2011-02-13 ENCOUNTER — Telehealth: Payer: Self-pay | Admitting: Cardiology

## 2011-02-13 NOTE — Telephone Encounter (Signed)
Pt calling re rx that insurance needs auth on topprol xl, pls call when done

## 2011-02-21 NOTE — Telephone Encounter (Signed)
Pt's dtr calling back re message left 02-13-11 re auth on toprol xl 100mg , pt checking on status, pt hard of hearing asked to pls talk loud when calling back pt [phone]

## 2011-06-04 ENCOUNTER — Other Ambulatory Visit: Payer: Self-pay | Admitting: Nephrology

## 2011-06-04 DIAGNOSIS — N184 Chronic kidney disease, stage 4 (severe): Secondary | ICD-10-CM

## 2011-06-05 ENCOUNTER — Ambulatory Visit
Admission: RE | Admit: 2011-06-05 | Discharge: 2011-06-05 | Disposition: A | Discharge status: Home / Self Care | Payer: Medicare Other | Source: Ambulatory Visit | Attending: Nephrology | Admitting: Nephrology

## 2011-06-05 DIAGNOSIS — N184 Chronic kidney disease, stage 4 (severe): Secondary | ICD-10-CM

## 2011-06-06 ENCOUNTER — Other Ambulatory Visit: Payer: Self-pay | Admitting: Nephrology

## 2011-06-06 DIAGNOSIS — N2889 Other specified disorders of kidney and ureter: Secondary | ICD-10-CM

## 2011-06-10 ENCOUNTER — Other Ambulatory Visit: Payer: Self-pay | Admitting: Nephrology

## 2011-06-10 DIAGNOSIS — Z139 Encounter for screening, unspecified: Secondary | ICD-10-CM

## 2011-06-12 ENCOUNTER — Ambulatory Visit
Admission: RE | Admit: 2011-06-12 | Discharge: 2011-06-12 | Disposition: A | Discharge status: Home / Self Care | Payer: Medicare Other | Source: Ambulatory Visit | Attending: Nephrology | Admitting: Nephrology

## 2011-06-12 DIAGNOSIS — Z139 Encounter for screening, unspecified: Secondary | ICD-10-CM

## 2011-06-12 DIAGNOSIS — N2889 Other specified disorders of kidney and ureter: Secondary | ICD-10-CM

## 2011-08-01 ENCOUNTER — Other Ambulatory Visit: Payer: Self-pay | Admitting: Cardiology

## 2011-10-08 ENCOUNTER — Other Ambulatory Visit: Payer: Self-pay

## 2011-10-08 DIAGNOSIS — N184 Chronic kidney disease, stage 4 (severe): Secondary | ICD-10-CM

## 2011-10-08 DIAGNOSIS — Z01810 Encounter for preprocedural cardiovascular examination: Secondary | ICD-10-CM

## 2011-10-25 ENCOUNTER — Emergency Department (HOSPITAL_COMMUNITY)
Admission: EM | Admit: 2011-10-25 | Discharge: 2011-10-25 | Disposition: A | Discharge status: Home / Self Care | Payer: Medicare Other | Attending: Emergency Medicine | Admitting: Emergency Medicine

## 2011-10-25 ENCOUNTER — Encounter (HOSPITAL_COMMUNITY): Payer: Self-pay | Admitting: Emergency Medicine

## 2011-10-25 DIAGNOSIS — I4891 Unspecified atrial fibrillation: Secondary | ICD-10-CM | POA: Insufficient documentation

## 2011-10-25 DIAGNOSIS — I509 Heart failure, unspecified: Secondary | ICD-10-CM | POA: Insufficient documentation

## 2011-10-25 DIAGNOSIS — Z882 Allergy status to sulfonamides status: Secondary | ICD-10-CM | POA: Insufficient documentation

## 2011-10-25 DIAGNOSIS — Z886 Allergy status to analgesic agent status: Secondary | ICD-10-CM | POA: Insufficient documentation

## 2011-10-25 DIAGNOSIS — I059 Rheumatic mitral valve disease, unspecified: Secondary | ICD-10-CM | POA: Insufficient documentation

## 2011-10-25 DIAGNOSIS — E039 Hypothyroidism, unspecified: Secondary | ICD-10-CM | POA: Insufficient documentation

## 2011-10-25 DIAGNOSIS — R338 Other retention of urine: Secondary | ICD-10-CM | POA: Insufficient documentation

## 2011-10-25 DIAGNOSIS — N289 Disorder of kidney and ureter, unspecified: Secondary | ICD-10-CM | POA: Insufficient documentation

## 2011-10-25 DIAGNOSIS — I10 Essential (primary) hypertension: Secondary | ICD-10-CM | POA: Insufficient documentation

## 2011-10-25 DIAGNOSIS — Z79899 Other long term (current) drug therapy: Secondary | ICD-10-CM | POA: Insufficient documentation

## 2011-10-25 DIAGNOSIS — R34 Anuria and oliguria: Secondary | ICD-10-CM

## 2011-10-25 DIAGNOSIS — E871 Hypo-osmolality and hyponatremia: Secondary | ICD-10-CM | POA: Insufficient documentation

## 2011-10-25 DIAGNOSIS — Z7901 Long term (current) use of anticoagulants: Secondary | ICD-10-CM | POA: Insufficient documentation

## 2011-10-25 DIAGNOSIS — Z86711 Personal history of pulmonary embolism: Secondary | ICD-10-CM | POA: Insufficient documentation

## 2011-10-25 LAB — CBC WITH DIFFERENTIAL/PLATELET
Basophils Relative: 1 % (ref 0–1)
Eosinophils Absolute: 0.1 10*3/uL (ref 0.0–0.7)
Eosinophils Relative: 2 % (ref 0–5)
HCT: 27.5 % — ABNORMAL LOW (ref 36.0–46.0)
Hemoglobin: 9.6 g/dL — ABNORMAL LOW (ref 12.0–15.0)
MCH: 33.9 pg (ref 26.0–34.0)
MCHC: 34.9 g/dL (ref 30.0–36.0)
Monocytes Absolute: 0.9 10*3/uL (ref 0.1–1.0)
Monocytes Relative: 16 % — ABNORMAL HIGH (ref 3–12)

## 2011-10-25 LAB — COMPREHENSIVE METABOLIC PANEL
Albumin: 3.5 g/dL (ref 3.5–5.2)
BUN: 47 mg/dL — ABNORMAL HIGH (ref 6–23)
Calcium: 9.5 mg/dL (ref 8.4–10.5)
Creatinine, Ser: 2.57 mg/dL — ABNORMAL HIGH (ref 0.50–1.10)
Total Bilirubin: 0.4 mg/dL (ref 0.3–1.2)
Total Protein: 7.7 g/dL (ref 6.0–8.3)

## 2011-10-25 LAB — URINE MICROSCOPIC-ADD ON

## 2011-10-25 LAB — URINALYSIS, ROUTINE W REFLEX MICROSCOPIC
Bilirubin Urine: NEGATIVE
Glucose, UA: NEGATIVE mg/dL
Ketones, ur: NEGATIVE mg/dL
Protein, ur: 100 mg/dL — AB
pH: 7 (ref 5.0–8.0)

## 2011-10-25 NOTE — ED Provider Notes (Signed)
History     CSN: [id number]  Arrival date & time 10/25/11  1105   First MD Initiated Contact with Patient 10/25/11 1159      Chief Complaint  Patient presents with  . Urinary Retention    (Consider location/radiation/quality/duration/timing/severity/associated sxs/prior treatment) HPI Comments: Cheryl [name] is a 78 y.o. Female who presents with complaint of urinary retention. States has not used bathroom since yesterday evening. Pt states she takes fluid pills for chf, and normally urinates "all day long." States she took her fluids pill yesterday once at 8am and one at 2pm, like she normally does. States does not feel like she has to urinate. No abdominal pain. No fever, chills, malaise. States she is drinking plenty of fluids. denies shortness of breath. States has hx of renal insufficiency, followed by Dr. Allena Katz, per family, there was a talk about possible dialysis in the near future.    Past Medical History  Diagnosis Date  . HTN (hypertension)     2 YEARS  . Hypothyroidism   . Temporal arteritis   . Renal insufficiency   . Pulmonary embolism   . Mitral regurgitation     MILD  . Heart failure     PROBABLE WITH A WELL- PRESERVED EJRCTION FRACTION  . Cataracta   . Chronic anemia   . Paroxysmal atrial fibrillation   . Pulmonary HTN     MOD  . S/P cardiac cath     RT.HRT CATH 12/2009: RA MEAN 12, RV 45/11,PA 47/21,PCWP MEAN 26, CO 4.56, CI 2.7 ABNORMAL MYOVIEW 12/2009  . Ischemia     ANTERIOR,EF 75%----NO CATH DONE DUE TO CKD    Past Surgical History  Procedure Date  . Temporal arteny bx   . Cataract surgery     Family History  Problem Relation Age of Onset  . Heart attack Mother 30  . Stroke Father 39  . Hypertension Sister   . Other      THERE IS NO EARLY HEART DISEASE    History  Substance Use Topics  . Smoking status: Never Smoker   . Smokeless tobacco: Not on file  . Alcohol Use: No    OB History    Grav Para Term Preterm Abortions TAB SAB  Ect Mult Living                  Review of Systems  Constitutional: Negative for fever and chills.  HENT: Negative for neck pain and neck stiffness.   Respiratory: Negative.   Cardiovascular: Negative.   Gastrointestinal: Negative for nausea, vomiting, abdominal pain, diarrhea and constipation.  Genitourinary: Positive for decreased urine volume and difficulty urinating. Negative for dysuria, hematuria and flank pain.  Musculoskeletal: Negative.   Skin: Negative.   Neurological: Negative for dizziness, weakness and headaches.    Allergies  Codeine and Sulfonamide derivatives  Home Medications   Current Outpatient Rx  Name Route Sig Dispense Refill  . ACETAMINOPHEN 325 MG PO TABS  as needed.      [name] AMLODIPINE BESYLATE 5 MG PO TABS Oral Take 5 mg by mouth daily.      [name] VITAMIN D 400 UNITS PO CAPS Oral Take 400 Units by mouth 2 (two) times daily.      [name] LEVOTHYROXINE SODIUM 75 MCG PO TABS Oral Take 75 mcg by mouth daily.      [name] MELATONIN 5 MG PO TABS Oral Take 1 tablet by mouth at bedtime.      [name] METOPROLOL TARTRATE 100 MG PO  TABS  TAKE 1 TABLET BY MOUTH TWICE A DAY 60 tablet 3  . METOPROLOL SUCCINATE ER 100 MG PO TB24 Oral Take 1 tablet (100 mg total) by mouth 2 (two) times daily.    [name] METOPROLOL SUCCINATE ER 25 MG PO TB24  TAKE 1/2 TABLET AS NEEDED FOR ATRIAL FIB 30 tablet 11  . MIRTAZAPINE 15 MG PO TABS Oral Take 15 mg by mouth at bedtime.      . MULTIVITAMINS PO TABS Oral Take 1 tablet by mouth daily.      [name] OMEPRAZOLE 20 MG PO CPDR Oral Take 20 mg by mouth daily.      [name] POLYETHYLENE GLYCOL 3350 PO POWD  UAD     . WARFARIN SODIUM 5 MG PO TABS  as directed.        BP 139/57  Pulse 61  Temp 97.7 F (36.5 C) (Oral)  Resp 16  SpO2 98%  Physical Exam  Nursing note and vitals reviewed. Constitutional: She appears well-developed and well-nourished. No distress.  HENT:  Head: Normocephalic and atraumatic.  Eyes: Conjunctivae normal are normal.  Neck: Neck supple.    Cardiovascular: Normal rate, regular rhythm and normal heart sounds.   Pulmonary/Chest: Effort normal and breath sounds normal. No respiratory distress. She has no wheezes. She has no rales.  Abdominal: Soft. Bowel sounds are normal. She exhibits no distension. There is no tenderness. There is no rebound and no guarding.  Musculoskeletal: She exhibits edema.       1+ edema of LE bilaterally  Neurological: She is alert.  Skin: Skin is warm and dry.  Psychiatric: She has a normal mood and affect.    ED Course  Procedures (including critical care time)  Pt with reduced urination since last night. Pt does not have any abdominal pain, enderness, bladder appears normal. Bladder scan about 1hr after voiding showed 140cc, will get labs, creatinine, ua  Results for orders placed during the hospital encounter of 10/25/11  URINALYSIS, ROUTINE W REFLEX MICROSCOPIC      Component Value Range   Color, Urine YELLOW  YELLOW   APPearance CLEAR  CLEAR   Specific Gravity, Urine 1.006  1.005 - 1.030   pH 7.0  5.0 - 8.0   Glucose, UA NEGATIVE  NEGATIVE mg/dL   Hgb urine dipstick TRACE (*) NEGATIVE   Bilirubin Urine NEGATIVE  NEGATIVE   Ketones, ur NEGATIVE  NEGATIVE mg/dL   Protein, ur 454 (*) NEGATIVE mg/dL   Urobilinogen, UA 0.2  0.0 - 1.0 mg/dL   Nitrite NEGATIVE  NEGATIVE   Leukocytes, UA NEGATIVE  NEGATIVE  CBC WITH DIFFERENTIAL      Component Value Range   WBC 5.5  4.0 - 10.5 K/uL   RBC 2.83 (*) 3.87 - 5.11 MIL/uL   Hemoglobin 9.6 (*) 12.0 - 15.0 g/dL   HCT 09.8 (*) 11.9 - 14.7 %   MCV 97.2  78.0 - 100.0 fL   MCH 33.9  26.0 - 34.0 pg   MCHC 34.9  30.0 - 36.0 g/dL   RDW 82.9  56.2 - 13.0 %   Platelets 258  150 - 400 K/uL   Neutrophils Relative 61  43 - 77 %   Neutro Abs 3.4  1.7 - 7.7 K/uL   Lymphocytes Relative 21  12 - 46 %   Lymphs Abs 1.1  0.7 - 4.0 K/uL   Monocytes Relative 16 (*) 3 - 12 %   Monocytes Absolute 0.9  0.1 - 1.0 K/uL  Eosinophils Relative 2  0 - 5 %   Eosinophils  Absolute 0.1  0.0 - 0.7 K/uL   Basophils Relative 1  0 - 1 %   Basophils Absolute 0.0  0.0 - 0.1 K/uL  COMPREHENSIVE METABOLIC PANEL      Component Value Range   Sodium 127 (*) 135 - 145 mEq/L   Potassium 4.1  3.5 - 5.1 mEq/L   Chloride 88 (*) 96 - 112 mEq/L   CO2 29  19 - 32 mEq/L   Glucose, Bld 107 (*) 70 - 99 mg/dL   BUN 47 (*) 6 - 23 mg/dL   Creatinine, Ser 4.09 (*) 0.50 - 1.10 mg/dL   Calcium 9.5  8.4 - 81.1 mg/dL   Total Protein 7.7  6.0 - 8.3 g/dL   Albumin 3.5  3.5 - 5.2 g/dL   AST 28  0 - 37 U/L   ALT 11  0 - 35 U/L   Alkaline Phosphatase 110  39 - 117 U/L   Total Bilirubin 0.4  0.3 - 1.2 mg/dL   GFR calc non Af Amer 16 (*) >90 mL/min   GFR calc Af Amer 19 (*) >90 mL/min  URINE MICROSCOPIC-ADD ON      Component Value Range   Squamous Epithelial / LPF RARE  RARE   RBC / HPF 0-2  <3 RBC/hpf     1:28 PM Pt's Creatinine is at her baseline. Low sodium. Urine negative. Bladder scanner showed 140cc retained. Pt has no abdominal pain or tenderness. Possible dehydration, or just did not have to urinate over night. Pt has been able to urinate here in ED. She is feeling well and states wants to go home. Pt will be d/c home with follow up outpatient. Pt to return if she still unable to urinate today, or if develops new symptoms.   1. Decreased urine volume   2. Renal insufficiency   3. Hyponatremia       MDM          Lottie Mussel, PA 10/25/11 [phone]

## 2011-10-25 NOTE — ED Notes (Signed)
Pt reports, "My kidney are not working right." Pt reports decrease urine output. Pt takes fluid pills, reports only does dribbles of urine. Pt denies pain or odor with urination.

## 2011-10-25 NOTE — ED Notes (Signed)
Residual urine 179 ml

## 2011-10-25 NOTE — ED Notes (Signed)
Vistor reports spoke with Dr. Allena Katz and was told to bring pt to ED for further eval, possible blood work.

## 2011-10-26 NOTE — ED Provider Notes (Signed)
Medical screening examination/treatment/procedure(s) were conducted as a shared visit with non-physician practitioner(s) and myself.  I personally evaluated the patient during the encounter   Korryn Pancoast, MD 10/26/11 0732 

## 2011-10-27 ENCOUNTER — Encounter: Payer: Self-pay | Admitting: Vascular Surgery

## 2011-10-28 ENCOUNTER — Ambulatory Visit (INDEPENDENT_AMBULATORY_CARE_PROVIDER_SITE_OTHER): Payer: PRIVATE HEALTH INSURANCE | Admitting: Vascular Surgery

## 2011-10-28 ENCOUNTER — Encounter: Payer: Self-pay | Admitting: Vascular Surgery

## 2011-10-28 ENCOUNTER — Encounter (INDEPENDENT_AMBULATORY_CARE_PROVIDER_SITE_OTHER): Payer: Medicare Other | Admitting: *Deleted

## 2011-10-28 VITALS — BP 155/63 | HR 55 | Ht 62.0 in | Wt 155.0 lb

## 2011-10-28 DIAGNOSIS — Z01810 Encounter for preprocedural cardiovascular examination: Secondary | ICD-10-CM

## 2011-10-28 DIAGNOSIS — N184 Chronic kidney disease, stage 4 (severe): Secondary | ICD-10-CM

## 2011-10-28 DIAGNOSIS — N186 End stage renal disease: Secondary | ICD-10-CM

## 2011-10-28 NOTE — Progress Notes (Signed)
Subjective:     Patient ID: [name], female   DOB: November 15, 1925, 78 y.o.   MRN: [id number]  HPI this 78 year old female with chronic renal insufficiency was referred for vascular access. She is right-handed. She does take Coumadin for a pulmonary embolus which occurred in the 1990s.  Past Medical History  Diagnosis Date  . HTN (hypertension)     2 YEARS  . Hypothyroidism   . Temporal arteritis   . Renal insufficiency   . Pulmonary embolism   . Mitral regurgitation     MILD  . Heart failure     PROBABLE WITH A WELL- PRESERVED EJRCTION FRACTION  . Cataracta   . Chronic anemia   . Paroxysmal atrial fibrillation   . Pulmonary HTN     MOD  . S/P cardiac cath     RT.HRT CATH 12/2009: RA MEAN 12, RV 45/11,PA 47/21,PCWP MEAN 26, CO 4.56, CI 2.7 ABNORMAL MYOVIEW 12/2009  . Ischemia     ANTERIOR,EF 75%----NO CATH DONE DUE TO CKD  . CHF (congestive heart failure)   . Irregular heart beat     History  Substance Use Topics  . Smoking status: Never Smoker   . Smokeless tobacco: Never Used  . Alcohol Use: No    Family History  Problem Relation Age of Onset  . Heart attack Mother 54  . Stroke Father 72  . Hypertension Sister   . Other      THERE IS NO EARLY HEART DISEASE  . Cancer Brother     prostate  . Diabetes Daughter   . Hyperlipidemia Daughter   . Hypertension Daughter     Allergies  Allergen Reactions  . Codeine   . Sulfonamide Derivatives     Current outpatient prescriptions:acetaminophen (TYLENOL) 325 MG tablet, as needed.  , Disp: , Rfl: ;  amLODipine (NORVASC) 5 MG tablet, Take 5 mg by mouth daily.  , Disp: , Rfl: ;  Cholecalciferol (VITAMIN D) 400 UNITS capsule, Take 400 Units by mouth 2 (two) times daily.  , Disp: , Rfl: ;  furosemide (LASIX) 20 MG tablet, Take 20 mg by mouth daily., Disp: , Rfl:  levothyroxine (SYNTHROID, LEVOTHROID) 75 MCG tablet, Take 75 mcg by mouth daily.  , Disp: , Rfl: ;  Melatonin 5 MG TABS, Take 1 tablet by mouth at bedtime.  ,  Disp: , Rfl: ;  metoprolol (LOPRESSOR) 100 MG tablet, TAKE 1 TABLET BY MOUTH TWICE A DAY, Disp: 60 tablet, Rfl: 3;  metoprolol (TOPROL XL) 100 MG 24 hr tablet, Take 1 tablet (100 mg total) by mouth 2 (two) times daily., Disp: , Rfl:  metoprolol succinate (TOPROL XL) 25 MG 24 hr tablet, TAKE 1/2 TABLET AS NEEDED FOR ATRIAL FIB, Disp: 30 tablet, Rfl: 11;  mirtazapine (REMERON) 15 MG tablet, Take 15 mg by mouth at bedtime.  , Disp: , Rfl: ;  multivitamin (THERAGRAN) per tablet, Take 1 tablet by mouth daily.  , Disp: , Rfl: ;  omeprazole (PRILOSEC) 20 MG capsule, Take 20 mg by mouth daily.  , Disp: , Rfl:  oxybutynin (DITROPAN-XL) 10 MG 24 hr tablet, Take 10 mg by mouth daily., Disp: , Rfl: ;  polyethylene glycol powder (GLYCOLAX/MIRALAX) powder, UAD , Disp: , Rfl: ;  warfarin (COUMADIN) 5 MG tablet, as directed.  , Disp: , Rfl:   BP 155/63  Pulse 55  Ht 5\' 2"  (1.575 m)  Wt 155 lb (70.308 kg)  BMI 28.35 kg/m2  SpO2 100%  Body mass index is  28.35 kg/(m^2).          Review of Systems denies chest pain, does have dyspnea on exertion. Does not ambulate long distances. Other systems negative and complete review of systems other than occasional wheezing    Objective:   Physical Exam blood pressure 150/63 heart rate 85 respirations 16 Gen.-alert and oriented x3 in no apparent distress HEENT normal for age Lungs no rhonchi or wheezing Cardiovascular regular rhythm no murmurs carotid pulses 3+ palpable no bruits audible Abdomen soft nontender no palpable masses Musculoskeletal free of  major deformities Skin clear -no rashes Neurologic normal Lower extremities 3+ femoral and dorsalis pedis pulses palpable bilaterally with 1+ edema Left upper extremity with 3+ brachial radial pulse palpable.  Today I ordered vein mapping of both upper extremities which I reviewed and interpreted. Cephalic veins are of poor quality and size bilaterally. The left basilic vein is the larger of the 2. I verified  these results with an independent SonoSite ultrasound exam at the bedside.      Assessment:     Chronic renal insufficiency needs vascular access-left basilic vein transposition    Plan:     #1 we'll hold Coumadin beginning Friday, October 18 #2 plan left basilic vein transposition on Thursday, October 24-we'll then resume Coumadin therapy postop

## 2011-10-29 ENCOUNTER — Other Ambulatory Visit: Payer: Self-pay

## 2011-10-29 ENCOUNTER — Telehealth: Payer: Self-pay

## 2011-10-29 NOTE — Telephone Encounter (Signed)
Call placed to Dr. Lamar Sprinkles office; spoke with nurse, Enrique Sack.  Requesting approval from Dr. Marina Goodell to have pt. Hold Coumadin 5 days prior to surgery on 11/13/11.  If Dr. Marina Goodell recommends Lovenox bridge then request that Dr. Marina Goodell to order/ initiate the Lovenox, as the vascular surgeon doesn't usually manage Lovenox.  Per Enrique Sack, fax request to [phone].

## 2011-11-03 ENCOUNTER — Encounter (HOSPITAL_COMMUNITY): Payer: Self-pay | Admitting: Pharmacy Technician

## 2011-11-05 ENCOUNTER — Encounter (HOSPITAL_COMMUNITY)
Admission: RE | Admit: 2011-11-05 | Discharge: 2011-11-05 | Disposition: A | Discharge status: Home / Self Care | Payer: Medicare Other | Source: Ambulatory Visit | Attending: Vascular Surgery | Admitting: Vascular Surgery

## 2011-11-05 ENCOUNTER — Encounter (HOSPITAL_COMMUNITY): Payer: Self-pay

## 2011-11-05 HISTORY — DX: Personal history of other specified conditions: Z87.898

## 2011-11-05 HISTORY — DX: End stage renal disease: N18.6

## 2011-11-05 HISTORY — DX: Unspecified hearing loss, unspecified ear: H91.90

## 2011-11-05 HISTORY — DX: Gastro-esophageal reflux disease without esophagitis: K21.9

## 2011-11-05 HISTORY — DX: Sleep disorder, unspecified: G47.9

## 2011-11-05 HISTORY — DX: Other constipation: K59.09

## 2011-11-05 LAB — SURGICAL PCR SCREEN: MRSA, PCR: NEGATIVE

## 2011-11-05 NOTE — Pre-Procedure Instructions (Signed)
20 Cherylyn L Mcclane  11/05/2011   Your procedure is scheduled on:  Thursday, October 24  Report to [facility] Stay Center at 0530 AM.  Call this number if you have problems the morning of surgery: [phone]   Remember:   Do not eat food or drink any liquids:After Midnight.    Take these medicines the morning of surgery with A SIP OF WATER: Amlodipine,Synthroid,Metoprolol,Omeprazole,Oxybutynin   Do not wear jewelry, make-up or nail polish.  Do not wear lotions, powders, or perfumes. You may wear deodorant.  Do not shave 48 hours prior to surgery. Men may shave face and neck.  Do not bring valuables to the hospital.  Contacts, dentures or bridgework may not be worn into surgery.     Patients discharged the day of surgery will not be allowed to drive home.  Name and phone number of your driver: daughter  Special Instructions: Shower using CHG 2 nights before surgery and the night before surgery.  If you shower the day of surgery use CHG.  Use special wash - you have one bottle of CHG for all showers.  You should use approximately 1/3 of the bottle for each shower.   Please read over the following fact sheets that you were given: Pain Booklet, Coughing and Deep Breathing, MRSA Information and Surgical Site Infection Prevention

## 2011-11-06 NOTE — Consult Note (Addendum)
Anesthesia chart review: Patient is an 78 year old female scheduled for left basilic vein transposition 11/13/2011 by Dr. Hart Rochester. History includes PE '97 on chronic Coumadin, HTN, hypothyroidism, temporal arteritis, anemia, CHF, paroxysmal atrial fibrillation, mild to moderate pulmonary hypertension and mild mitral and tricuspid regurgitation by 01/2010 echo. Hard of hearing.  Her cardiologist is Dr. Rollene Rotunda. He last saw her on 01/15/11.  EKG then showed normal sinus rhythm with incomplete right bundle branch block.  She was still having intermittent runs of afib, but no chest pain or new respiratory symptoms.  He prescribed a PRN dose of metoprolol for afib symptoms, but otherwise did not make any changes to her regimen.  PCP is Dr. Marina Goodell who recommended a Lovenox bridge while off Coumadin (see telephone notes by Lamar Blinks, RN).  Echo on 02/07/10 showed: - Left ventricle: There is significant diastolic dysfunction. The cavity size was normal. Wall thickness was increased in a pattern of mild LVH. The estimated ejection fraction was 60%. Wall motion was normal; there were no regional wall motion abnormalities.Doppler parameters are consistent with high ventricular filling pressure. - Mitral valve: Calcified annulus. Mild regurgitation. - Right ventricle: The cavity size was normal. Systolic function was normal. - Tricuspid valve: Mild regurgitation.  - Pulmonary arteries: PA peak pressure: 37mm Hg (S). - Pericardium, extracardiac: A trivial pericardial effusion was identified posterior to the heart.  On 12/17/2009 she had an abnormal nuclear stress test (ordered due to increased dyspnea) that showed evidence of anterior (septal apical) ischemia, EF 75%.  Dr. Antoine Poche opted to do a right heart cath only due to her renal disease.  This was done on 12/24/09 and showed pulmonary artery pressures of 47/21 with mean of 34 and wedge of 26 with a large V wave. He suspected her dyspnea was rather  related to pulmonary hypertension.   She is for labs and a CXR on the day of surgery.  (I sent a staff message to Lamar Blinks and Oswaldo Done at [facility] regarding Na 127 on 10/25/11.  I've asked them to let me know if Dr. Hart Rochester would like for Short Stay to have the patient come in prior to the day of surgery to have this re-evaluated and to check a PT/PTT--particularly in light of this being a first case.)  I reviewed cardiac history with Anesthesiologist Dr. Gypsy Balsam.  She will be evaluated by her anesthesiologist on the day of surgery, but if no acute CV/CHF symptoms and if labs/CXR are reasonable then would plan to proceed.  Shonna Chock, PA-C 11/06/11 1523  Addendum: 11/07/11 1550 Per Lamar Blinks, RN at [facility].  She reviewed patient's labs with Dr. Imogene Burn and forwarded results to patient's PCP Dr. Marina Goodell.  Currently, [facility] is okay with repeating labs on the day of surgery.

## 2011-11-10 ENCOUNTER — Other Ambulatory Visit: Payer: Self-pay

## 2011-11-11 ENCOUNTER — Encounter (HOSPITAL_COMMUNITY)
Admission: RE | Admit: 2011-11-11 | Discharge: 2011-11-11 | Disposition: A | Discharge status: Home / Self Care | Payer: Medicare Other | Source: Ambulatory Visit | Attending: Nephrology | Admitting: Nephrology

## 2011-11-11 DIAGNOSIS — D638 Anemia in other chronic diseases classified elsewhere: Secondary | ICD-10-CM | POA: Insufficient documentation

## 2011-11-11 DIAGNOSIS — N184 Chronic kidney disease, stage 4 (severe): Secondary | ICD-10-CM | POA: Insufficient documentation

## 2011-11-11 LAB — IRON AND TIBC
Iron: 80 ug/dL (ref 42–135)
TIBC: 247 ug/dL — ABNORMAL LOW (ref 250–470)

## 2011-11-11 MED ORDER — EPOETIN ALFA 10000 UNIT/ML IJ SOLN
10000.0000 [IU] | INTRAMUSCULAR | Status: DC
  Administered 2011-11-11: 10000 [IU] via SUBCUTANEOUS

## 2011-11-11 MED ORDER — EPOETIN ALFA 10000 UNIT/ML IJ SOLN
INTRAMUSCULAR | Status: AC
  Filled 2011-11-11: qty 1

## 2011-11-12 LAB — FERRITIN: Ferritin: 166 ng/mL (ref 10–291)

## 2011-11-12 MED ORDER — CEFAZOLIN SODIUM-DEXTROSE 2-3 GM-% IV SOLR
2.0000 g | INTRAVENOUS | Status: AC
  Administered 2011-11-13: 2 g via INTRAVENOUS
  Filled 2011-11-12 (×2): qty 50

## 2011-11-13 ENCOUNTER — Telehealth: Payer: Self-pay | Admitting: Vascular Surgery

## 2011-11-13 ENCOUNTER — Encounter (HOSPITAL_COMMUNITY)
Admission: RE | Disposition: A | Discharge status: Home / Self Care | Payer: Medicare Other | Source: Ambulatory Visit | Attending: Vascular Surgery

## 2011-11-13 ENCOUNTER — Ambulatory Visit (HOSPITAL_COMMUNITY): Payer: Medicare Other | Admitting: Vascular Surgery

## 2011-11-13 ENCOUNTER — Ambulatory Visit (HOSPITAL_COMMUNITY)
Admission: RE | Admit: 2011-11-13 | Discharge: 2011-11-13 | Disposition: A | Discharge status: Home / Self Care | Payer: Medicare Other | Source: Ambulatory Visit | Attending: Vascular Surgery | Admitting: Vascular Surgery

## 2011-11-13 ENCOUNTER — Encounter (HOSPITAL_COMMUNITY): Payer: Self-pay | Admitting: Surgery

## 2011-11-13 ENCOUNTER — Encounter (HOSPITAL_COMMUNITY): Payer: Self-pay | Admitting: Vascular Surgery

## 2011-11-13 ENCOUNTER — Ambulatory Visit (HOSPITAL_COMMUNITY): Payer: Medicare Other

## 2011-11-13 DIAGNOSIS — N186 End stage renal disease: Secondary | ICD-10-CM

## 2011-11-13 DIAGNOSIS — I120 Hypertensive chronic kidney disease with stage 5 chronic kidney disease or end stage renal disease: Secondary | ICD-10-CM | POA: Insufficient documentation

## 2011-11-13 LAB — PROTIME-INR: INR: 1.28 (ref 0.00–1.49)

## 2011-11-13 LAB — POCT I-STAT, CHEM 8
BUN: 53 mg/dL — ABNORMAL HIGH (ref 6–23)
Calcium, Ion: 1.16 mmol/L (ref 1.13–1.30)
Chloride: 95 mEq/L — ABNORMAL LOW (ref 96–112)
HCT: 28 % — ABNORMAL LOW (ref 36.0–46.0)
Potassium: 4.1 mEq/L (ref 3.5–5.1)
Sodium: 131 mEq/L — ABNORMAL LOW (ref 135–145)

## 2011-11-13 SURGERY — TRANSPOSITION, VEIN, BASILIC
Anesthesia: General | Site: Arm Upper | Laterality: Left | Wound class: Clean

## 2011-11-13 MED ORDER — EPHEDRINE SULFATE 50 MG/ML IJ SOLN
INTRAMUSCULAR | Status: DC | PRN
  Administered 2011-11-13: 10 mg via INTRAVENOUS
  Administered 2011-11-13 (×2): 5 mg via INTRAVENOUS

## 2011-11-13 MED ORDER — HYDROMORPHONE HCL PF 1 MG/ML IJ SOLN
INTRAMUSCULAR | Status: AC
  Filled 2011-11-13: qty 1

## 2011-11-13 MED ORDER — PROPOFOL 10 MG/ML IV BOLUS
INTRAVENOUS | Status: DC | PRN
  Administered 2011-11-13: 150 mg via INTRAVENOUS

## 2011-11-13 MED ORDER — OXYCODONE HCL 5 MG PO TABS
ORAL_TABLET | ORAL | Status: AC
  Administered 2011-11-13: 5 mg via ORAL
  Filled 2011-11-13: qty 1

## 2011-11-13 MED ORDER — ONDANSETRON HCL 4 MG/2ML IJ SOLN
INTRAMUSCULAR | Status: DC | PRN
  Administered 2011-11-13: 4 mg via INTRAVENOUS

## 2011-11-13 MED ORDER — SODIUM CHLORIDE 0.9 % IV SOLN
INTRAVENOUS | Status: DC | PRN
  Administered 2011-11-13: 07:00:00 via INTRAVENOUS

## 2011-11-13 MED ORDER — ONDANSETRON HCL 4 MG/2ML IJ SOLN: 4.0000 mg | Freq: Four times a day (QID) | INTRAMUSCULAR | Status: DC | PRN

## 2011-11-13 MED ORDER — LACTATED RINGERS IV SOLN: INTRAVENOUS | Status: DC | PRN

## 2011-11-13 MED ORDER — OXYCODONE-ACETAMINOPHEN 5-325 MG PO TABS
ORAL_TABLET | ORAL | Status: AC
  Filled 2011-11-13: qty 1

## 2011-11-13 MED ORDER — SODIUM CHLORIDE 0.9 % IR SOLN
Status: DC | PRN
  Administered 2011-11-13: 08:00:00

## 2011-11-13 MED ORDER — 0.9 % SODIUM CHLORIDE (POUR BTL) OPTIME
TOPICAL | Status: DC | PRN
  Administered 2011-11-13: 1000 mL

## 2011-11-13 MED ORDER — PHENYLEPHRINE HCL 10 MG/ML IJ SOLN
10.0000 mg | INTRAVENOUS | Status: DC | PRN
  Administered 2011-11-13: 10 ug/min via INTRAVENOUS

## 2011-11-13 MED ORDER — SODIUM CHLORIDE 0.9 % IV SOLN: INTRAVENOUS | Status: DC

## 2011-11-13 MED ORDER — FENTANYL CITRATE 0.05 MG/ML IJ SOLN
25.0000 ug | INTRAMUSCULAR | Status: DC | PRN
  Administered 2011-11-13 (×2): 25 ug via INTRAVENOUS

## 2011-11-13 MED ORDER — FENTANYL CITRATE 0.05 MG/ML IJ SOLN
INTRAMUSCULAR | Status: DC | PRN
  Administered 2011-11-13 (×2): 25 ug via INTRAVENOUS
  Administered 2011-11-13: 50 ug via INTRAVENOUS

## 2011-11-13 MED ORDER — MIDAZOLAM HCL 5 MG/5ML IJ SOLN
INTRAMUSCULAR | Status: DC | PRN
  Administered 2011-11-13 (×2): 1 mg via INTRAVENOUS

## 2011-11-13 MED ORDER — OXYCODONE HCL 5 MG PO TABS: 5.0000 mg | ORAL_TABLET | ORAL | Status: DC | PRN

## 2011-11-13 MED ORDER — FENTANYL CITRATE 0.05 MG/ML IJ SOLN
INTRAMUSCULAR | Status: AC
  Filled 2011-11-13: qty 2

## 2011-11-13 SURGICAL SUPPLY — 41 items
CANISTER SUCTION 2500CC (MISCELLANEOUS) ×2 IMPLANT
CLIP TI MEDIUM 24 (CLIP) ×2 IMPLANT
CLIP TI WIDE RED SMALL 24 (CLIP) ×2 IMPLANT
CLOTH BEACON ORANGE TIMEOUT ST (SAFETY) ×2 IMPLANT
COVER PROBE W GEL 5X96 (DRAPES) IMPLANT
COVER SURGICAL LIGHT HANDLE (MISCELLANEOUS) ×2 IMPLANT
DERMABOND ADHESIVE PROPEN (GAUZE/BANDAGES/DRESSINGS) ×2
DERMABOND ADVANCED (GAUZE/BANDAGES/DRESSINGS) ×1
DERMABOND ADVANCED .7 DNX12 (GAUZE/BANDAGES/DRESSINGS) ×1 IMPLANT
DERMABOND ADVANCED .7 DNX6 (GAUZE/BANDAGES/DRESSINGS) ×2 IMPLANT
ELECT REM PT RETURN 9FT ADLT (ELECTROSURGICAL) ×2
ELECTRODE REM PT RTRN 9FT ADLT (ELECTROSURGICAL) ×1 IMPLANT
GEL ULTRASOUND 20GR AQUASONIC (MISCELLANEOUS) IMPLANT
GLOVE BIOGEL PI IND STRL 6.5 (GLOVE) ×3 IMPLANT
GLOVE BIOGEL PI IND STRL 7.0 (GLOVE) ×2 IMPLANT
GLOVE BIOGEL PI IND STRL 7.5 (GLOVE) ×1 IMPLANT
GLOVE BIOGEL PI INDICATOR 6.5 (GLOVE) ×3
GLOVE BIOGEL PI INDICATOR 7.0 (GLOVE) ×2
GLOVE BIOGEL PI INDICATOR 7.5 (GLOVE) ×1
GLOVE ECLIPSE 6.5 STRL STRAW (GLOVE) ×2 IMPLANT
GLOVE ECLIPSE 7.0 STRL STRAW (GLOVE) ×2 IMPLANT
GLOVE SS BIOGEL STRL SZ 7 (GLOVE) ×2 IMPLANT
GLOVE SUPERSENSE BIOGEL SZ 7 (GLOVE) ×2
GOWN STRL NON-REIN LRG LVL3 (GOWN DISPOSABLE) ×10 IMPLANT
KIT BASIN OR (CUSTOM PROCEDURE TRAY) ×2 IMPLANT
KIT ROOM TURNOVER OR (KITS) ×2 IMPLANT
NS IRRIG 1000ML POUR BTL (IV SOLUTION) ×2 IMPLANT
PACK CV ACCESS (CUSTOM PROCEDURE TRAY) ×2 IMPLANT
PAD ARMBOARD 7.5X6 YLW CONV (MISCELLANEOUS) ×4 IMPLANT
SPONGE GAUZE 4X4 12PLY (GAUZE/BANDAGES/DRESSINGS) ×2 IMPLANT
SUT PROLENE 6 0 BV (SUTURE) ×2 IMPLANT
SUT SILK 2 0 SH (SUTURE) ×2 IMPLANT
SUT SILK 3 0 (SUTURE) ×1
SUT SILK 3-0 18XBRD TIE 12 (SUTURE) ×1 IMPLANT
SUT VIC AB 3-0 SH 27 (SUTURE) ×3
SUT VIC AB 3-0 SH 27X BRD (SUTURE) ×3 IMPLANT
SUT VIC AB 4-0 PS2 27 (SUTURE) ×2 IMPLANT
TOWEL OR 17X24 6PK STRL BLUE (TOWEL DISPOSABLE) ×2 IMPLANT
TOWEL OR 17X26 10 PK STRL BLUE (TOWEL DISPOSABLE) ×2 IMPLANT
UNDERPAD 30X30 INCONTINENT (UNDERPADS AND DIAPERS) ×2 IMPLANT
WATER STERILE IRR 1000ML POUR (IV SOLUTION) ×2 IMPLANT

## 2011-11-13 NOTE — Transfer of Care (Signed)
Immediate Anesthesia Transfer of Care Note  Patient: Cheryl [name]  Procedure(s) Performed: Procedure(s) (LRB) with comments: BASCILIC VEIN TRANSPOSITION (Left) - Left Basilic Vein Transposition  Patient Location: PACU  Anesthesia Type: General  Level of Consciousness: awake and alert   Airway & Oxygen Therapy: Patient Spontanous Breathing  Post-op Assessment: Report given to PACU RN  Post vital signs: stable  Complications: No apparent anesthesia complications

## 2011-11-13 NOTE — Anesthesia Procedure Notes (Signed)
Procedure Name: LMA Insertion Date/Time: 11/13/2011 7:50 AM Performed by: Ellin Goodie Pre-anesthesia Checklist: Patient identified, Emergency Drugs available, Suction available, Patient being monitored and Timeout performed Patient Re-evaluated:Patient Re-evaluated prior to inductionOxygen Delivery Method: Circle system utilized Preoxygenation: Pre-oxygenation with 100% oxygen Intubation Type: IV induction Ventilation: Mask ventilation without difficulty LMA: LMA inserted LMA Size: 4.0 Number of attempts: 1 Placement Confirmation: positive ETCO2 and breath sounds checked- equal and bilateral Tube secured with: Tape Dental Injury: Teeth and Oropharynx as per pre-operative assessment

## 2011-11-13 NOTE — Anesthesia Postprocedure Evaluation (Signed)
Anesthesia Post Note  Patient: Cheryl [name]  Procedure(s) Performed: Procedure(s) (LRB): BASCILIC VEIN TRANSPOSITION (Left)  Anesthesia type: General  Patient location: PACU  Post pain: Pain level controlled and Adequate analgesia  Post assessment: Post-op Vital signs reviewed, Patient's Cardiovascular Status Stable, Respiratory Function Stable, Patent Airway and Pain level controlled  Last Vitals:  Filed Vitals:   11/13/11 1148  BP: 143/56  Pulse:   Temp:   Resp:     Post vital signs: Reviewed and stable  Level of consciousness: awake, alert  and oriented  Complications: No apparent anesthesia complications

## 2011-11-13 NOTE — Telephone Encounter (Addendum)
Message copied by Shari Prows on Thu Nov 13, 2011  3:58 PM ------      Message from: Phillips Odor      Created: Thu Nov 13, 2011  2:11 PM                   ----- Message -----         From: Lars Mage, PA         Sent: 11/13/2011   9:39 AM           To: Melene Plan, RN            Left AV fistula f/u in 6 weeks Dr. Hart Rochester  I scheduled an appt for the above pt on 12/23/11 at 10:15am with [facility]. I mailed a letter and also lm for pt.awt

## 2011-11-13 NOTE — Interval H&P Note (Signed)
History and Physical Interval Note:  11/13/2011 7:37 AM  Cheryl [name]  has presented today for surgery, with the diagnosis of End Stage Renal Disease   The various methods of treatment have been discussed with the patient and family. After consideration of risks, benefits and other options for treatment, the patient has consented to  Procedure(s) (LRB) with comments: BASCILIC VEIN TRANSPOSITION (Left) - Left Basilic Vein Transposition as a surgical intervention .  The patient's history has been reviewed, patient examined, no change in status, stable for surgery.  I have reviewed the patient's chart and labs.  Questions were answered to the patient's satisfaction.     [name]

## 2011-11-13 NOTE — Op Note (Signed)
OPERATIVE REPORT  Date of Surgery: 11/13/2011  Surgeon: [name], MD  Assistant: Lianne Cure PA  Pre-op Diagnosis: End Stage Renal Disease   Post-op Diagnosis: end stage renal disease  Procedure: Procedure(s): BASCILIC VEIN TRANSPOSITION-left upper extremity Anesthesia: Gen.  EBL: Minimal  Complications: None  Procedure Details: Patient was taken to the operating room placed in supine position at which time satisfactory general endotracheal anesthesia was a Optician, dispensing. The left upper extremity was examined. Using the SonoSite ultrasound basilic vein was marked from the antecubital area to the axilla. After prepping and draping in the routine sterile manner the basilic vein was dissected free through 3 separate incisions on the medial aspect of the left upper extremity. Branches were ligated with 3 and 4-0 silk ties and divided. Care was taken not to injure any accompanying nerves. There was ligated distally in the antecubital area and transected. It was gently dilated with heparinized saline was an excellent vein. Through the distal upper arm wound the brachial artery was exposed and circled with vessel loops. Was an excellent artery with a good pulse. Following this the vein was marked carefully tone along the anterior aspect of the arm and delivered into the distal wound. Brachial artery was occluded proximally and distally with Vesseloops of the 15 blade extended with Potts scissors. The vein was carefully measured spatulated slightly anastomosed end to side with 6-0 Prolene the brachial artery. Vessel loops released there was excellent pulse and palpable thrill in the fistula. There was also a palpable radial pulse with fistula open. Adequate hemostasis was achieved and the wounds closed in layers with Vicryl in a subcuticular fashion with Dermabond patient taken to recovery in stable condition   [name], MD 11/13/2011 9:57 AM

## 2011-11-13 NOTE — Anesthesia Preprocedure Evaluation (Addendum)
Anesthesia Evaluation  Patient identified by MRN, date of birth, ID band Patient awake    Reviewed: Allergy & Precautions, H&P , NPO status , Patient's Chart, lab work & pertinent test results  Airway Mallampati: II TM Distance: >3 FB Neck ROM: full    Dental  (+) Edentulous Upper and Dental Advisory Given   Pulmonary shortness of breath,  breath sounds clear to auscultation        Cardiovascular hypertension, Pt. on medications +CHF + dysrhythmias Atrial Fibrillation Rhythm:Irregular Rate:Normal     Neuro/Psych    GI/Hepatic GERD-  ,  Endo/Other  Hypothyroidism   Renal/GU ESRFRenal disease     Musculoskeletal   Abdominal (+)  Abdomen: soft. Bowel sounds: normal.  Peds  Hematology   Anesthesia Other Findings   Reproductive/Obstetrics                         Anesthesia Physical Anesthesia Plan  ASA: III  Anesthesia Plan: General   Post-op Pain Management:    Induction: Intravenous  Airway Management Planned: LMA  Additional Equipment:   Intra-op Plan:   Post-operative Plan:   Informed Consent: I have reviewed the patients History and Physical, chart, labs and discussed the procedure including the risks, benefits and alternatives for the proposed anesthesia with the patient or authorized representative who has indicated his/her understanding and acceptance.     Plan Discussed with: CRNA and Surgeon  Anesthesia Plan Comments:         Anesthesia Quick Evaluation

## 2011-11-13 NOTE — H&P (View-Only) (Signed)
Subjective:     Patient ID: Cheryl [name], female   DOB: 07/11/1925, 78 y.o.   MRN: [id number]  HPI this 78-year-old female with chronic renal insufficiency was referred for vascular access. She is right-handed. She does take Coumadin for a pulmonary embolus which occurred in the 1990s.  Past Medical History  Diagnosis Date  . HTN (hypertension)     2 YEARS  . Hypothyroidism   . Temporal arteritis   . Renal insufficiency   . Pulmonary embolism   . Mitral regurgitation     MILD  . Heart failure     PROBABLE WITH A WELL- PRESERVED EJRCTION FRACTION  . Cataracta   . Chronic anemia   . Paroxysmal atrial fibrillation   . Pulmonary HTN     MOD  . S/P cardiac cath     RT.HRT CATH 12/2009: RA MEAN 12, RV 45/11,PA 47/21,PCWP MEAN 26, CO 4.56, CI 2.7 ABNORMAL MYOVIEW 12/2009  . Ischemia     ANTERIOR,EF 75%----NO CATH DONE DUE TO CKD  . CHF (congestive heart failure)   . Irregular heart beat     History  Substance Use Topics  . Smoking status: Never Smoker   . Smokeless tobacco: Never Used  . Alcohol Use: No    Family History  Problem Relation Age of Onset  . Heart attack Mother 83  . Stroke Father 79  . Hypertension Sister   . Other      THERE IS NO EARLY HEART DISEASE  . Cancer Brother     prostate  . Diabetes Daughter   . Hyperlipidemia Daughter   . Hypertension Daughter     Allergies  Allergen Reactions  . Codeine   . Sulfonamide Derivatives     Current outpatient prescriptions:acetaminophen (TYLENOL) 325 MG tablet, as needed.  , Disp: , Rfl: ;  amLODipine (NORVASC) 5 MG tablet, Take 5 mg by mouth daily.  , Disp: , Rfl: ;  Cholecalciferol (VITAMIN D) 400 UNITS capsule, Take 400 Units by mouth 2 (two) times daily.  , Disp: , Rfl: ;  furosemide (LASIX) 20 MG tablet, Take 20 mg by mouth daily., Disp: , Rfl:  levothyroxine (SYNTHROID, LEVOTHROID) 75 MCG tablet, Take 75 mcg by mouth daily.  , Disp: , Rfl: ;  Melatonin 5 MG TABS, Take 1 tablet by mouth at bedtime.  ,  Disp: , Rfl: ;  metoprolol (LOPRESSOR) 100 MG tablet, TAKE 1 TABLET BY MOUTH TWICE A DAY, Disp: 60 tablet, Rfl: 3;  metoprolol (TOPROL XL) 100 MG 24 hr tablet, Take 1 tablet (100 mg total) by mouth 2 (two) times daily., Disp: , Rfl:  metoprolol succinate (TOPROL XL) 25 MG 24 hr tablet, TAKE 1/2 TABLET AS NEEDED FOR ATRIAL FIB, Disp: 30 tablet, Rfl: 11;  mirtazapine (REMERON) 15 MG tablet, Take 15 mg by mouth at bedtime.  , Disp: , Rfl: ;  multivitamin (THERAGRAN) per tablet, Take 1 tablet by mouth daily.  , Disp: , Rfl: ;  omeprazole (PRILOSEC) 20 MG capsule, Take 20 mg by mouth daily.  , Disp: , Rfl:  oxybutynin (DITROPAN-XL) 10 MG 24 hr tablet, Take 10 mg by mouth daily., Disp: , Rfl: ;  polyethylene glycol powder (GLYCOLAX/MIRALAX) powder, UAD , Disp: , Rfl: ;  warfarin (COUMADIN) 5 MG tablet, as directed.  , Disp: , Rfl:   BP 155/63  Pulse 55  Ht 5' 2" (1.575 m)  Wt 155 lb (70.308 kg)  BMI 28.35 kg/m2  SpO2 100%  Body mass index is   28.35 kg/(m^2).          Review of Systems denies chest pain, does have dyspnea on exertion. Does not ambulate long distances. Other systems negative and complete review of systems other than occasional wheezing    Objective:   Physical Exam blood pressure 150/63 heart rate 85 respirations 16 Gen.-alert and oriented x3 in no apparent distress HEENT normal for age Lungs no rhonchi or wheezing Cardiovascular regular rhythm no murmurs carotid pulses 3+ palpable no bruits audible Abdomen soft nontender no palpable masses Musculoskeletal free of  major deformities Skin clear -no rashes Neurologic normal Lower extremities 3+ femoral and dorsalis pedis pulses palpable bilaterally with 1+ edema Left upper extremity with 3+ brachial radial pulse palpable.  Today I ordered vein mapping of both upper extremities which I reviewed and interpreted. Cephalic veins are of poor quality and size bilaterally. The left basilic vein is the larger of the 2. I verified  these results with an independent SonoSite ultrasound exam at the bedside.      Assessment:     Chronic renal insufficiency needs vascular access-left basilic vein transposition    Plan:     #1 we'll hold Coumadin beginning Friday, October 18 #2 plan left basilic vein transposition on Thursday, October 24-we'll then resume Coumadin therapy postop      

## 2011-11-13 NOTE — Preoperative (Signed)
Beta Blockers   Reason not to administer Beta Blockers:Not Applicable 

## 2011-11-14 ENCOUNTER — Telehealth: Payer: Self-pay | Admitting: *Deleted

## 2011-11-14 ENCOUNTER — Other Ambulatory Visit: Payer: Self-pay | Admitting: *Deleted

## 2011-11-14 DIAGNOSIS — R110 Nausea: Secondary | ICD-10-CM

## 2011-11-14 MED ORDER — PROMETHAZINE HCL 12.5 MG PO TABS: 12.5000 mg | ORAL_TABLET | ORAL | Status: DC | PRN

## 2011-11-14 NOTE — Telephone Encounter (Signed)
Mrs. [place] daughter, [name], called to report pt was having nausea while using oxycodone IR 5mg  for postoperative pain. She is able to eat soups and soft foods, always having something on her stomach when she takes her meds. Patient's wounds look good per daughter except for a large amount of bruising (pt is on Coumadin). She is afebrile, no changes in LOC, respirations are good, no chest pain ( hx of PE), no wound drainage. I discussed this with Dr. Imogene Burn and he said to try Tylenol for pain and I could called in some Phenergan 12.5mg  for the patient's nausea. I called the daughter back and relayed this message. I instructed her to watch her mother closely for any signs of decreased respirations, or changes in LOC while on the Phenergan. She voiced understanding and was in agreement with this plan.

## 2011-11-20 ENCOUNTER — Emergency Department (HOSPITAL_COMMUNITY): Payer: Medicare Other

## 2011-11-20 ENCOUNTER — Inpatient Hospital Stay (HOSPITAL_COMMUNITY)
Admission: EM | Admit: 2011-11-20 | Discharge: 2011-11-24 | DRG: 640 | Disposition: A | Discharge status: Skilled Nursing Facility | Payer: Medicare Other | Attending: Internal Medicine | Admitting: Internal Medicine

## 2011-11-20 ENCOUNTER — Encounter (HOSPITAL_COMMUNITY): Payer: Self-pay | Admitting: Internal Medicine

## 2011-11-20 DIAGNOSIS — R5381 Other malaise: Secondary | ICD-10-CM

## 2011-11-20 DIAGNOSIS — I120 Hypertensive chronic kidney disease with stage 5 chronic kidney disease or end stage renal disease: Secondary | ICD-10-CM | POA: Diagnosis present

## 2011-11-20 DIAGNOSIS — R0602 Shortness of breath: Secondary | ICD-10-CM

## 2011-11-20 DIAGNOSIS — N259 Disorder resulting from impaired renal tubular function, unspecified: Secondary | ICD-10-CM

## 2011-11-20 DIAGNOSIS — N186 End stage renal disease: Secondary | ICD-10-CM

## 2011-11-20 DIAGNOSIS — I509 Heart failure, unspecified: Secondary | ICD-10-CM | POA: Diagnosis present

## 2011-11-20 DIAGNOSIS — Z86711 Personal history of pulmonary embolism: Secondary | ICD-10-CM

## 2011-11-20 DIAGNOSIS — R6889 Other general symptoms and signs: Secondary | ICD-10-CM

## 2011-11-20 DIAGNOSIS — R002 Palpitations: Secondary | ICD-10-CM

## 2011-11-20 DIAGNOSIS — I059 Rheumatic mitral valve disease, unspecified: Secondary | ICD-10-CM | POA: Diagnosis present

## 2011-11-20 DIAGNOSIS — D631 Anemia in chronic kidney disease: Secondary | ICD-10-CM | POA: Diagnosis present

## 2011-11-20 DIAGNOSIS — Z79899 Other long term (current) drug therapy: Secondary | ICD-10-CM

## 2011-11-20 DIAGNOSIS — R300 Dysuria: Secondary | ICD-10-CM

## 2011-11-20 DIAGNOSIS — I959 Hypotension, unspecified: Secondary | ICD-10-CM | POA: Diagnosis present

## 2011-11-20 DIAGNOSIS — K219 Gastro-esophageal reflux disease without esophagitis: Secondary | ICD-10-CM | POA: Diagnosis present

## 2011-11-20 DIAGNOSIS — I10 Essential (primary) hypertension: Secondary | ICD-10-CM

## 2011-11-20 DIAGNOSIS — R5383 Other fatigue: Secondary | ICD-10-CM

## 2011-11-20 DIAGNOSIS — R531 Weakness: Secondary | ICD-10-CM | POA: Diagnosis present

## 2011-11-20 DIAGNOSIS — E039 Hypothyroidism, unspecified: Secondary | ICD-10-CM | POA: Diagnosis present

## 2011-11-20 DIAGNOSIS — I2789 Other specified pulmonary heart diseases: Secondary | ICD-10-CM

## 2011-11-20 DIAGNOSIS — I080 Rheumatic disorders of both mitral and aortic valves: Secondary | ICD-10-CM

## 2011-11-20 DIAGNOSIS — I4891 Unspecified atrial fibrillation: Secondary | ICD-10-CM

## 2011-11-20 DIAGNOSIS — N039 Chronic nephritic syndrome with unspecified morphologic changes: Secondary | ICD-10-CM | POA: Diagnosis present

## 2011-11-20 DIAGNOSIS — Z7901 Long term (current) use of anticoagulants: Secondary | ICD-10-CM

## 2011-11-20 DIAGNOSIS — M79609 Pain in unspecified limb: Secondary | ICD-10-CM

## 2011-11-20 DIAGNOSIS — D649 Anemia, unspecified: Secondary | ICD-10-CM | POA: Diagnosis present

## 2011-11-20 DIAGNOSIS — K5900 Constipation, unspecified: Secondary | ICD-10-CM | POA: Diagnosis present

## 2011-11-20 DIAGNOSIS — R9439 Abnormal result of other cardiovascular function study: Secondary | ICD-10-CM

## 2011-11-20 DIAGNOSIS — M316 Other giant cell arteritis: Secondary | ICD-10-CM | POA: Diagnosis present

## 2011-11-20 DIAGNOSIS — E871 Hypo-osmolality and hyponatremia: Principal | ICD-10-CM

## 2011-11-20 HISTORY — DX: Adverse effect of unspecified anesthetic, initial encounter: T41.45XA

## 2011-11-20 HISTORY — DX: Other complications of anesthesia, initial encounter: T88.59XA

## 2011-11-20 HISTORY — DX: Other specified postprocedural states: Z98.890

## 2011-11-20 HISTORY — DX: Other specified postprocedural states: R11.2

## 2011-11-20 LAB — URINALYSIS, ROUTINE W REFLEX MICROSCOPIC
Bilirubin Urine: NEGATIVE
Glucose, UA: NEGATIVE mg/dL
Protein, ur: 30 mg/dL — AB
Urobilinogen, UA: 1 mg/dL (ref 0.0–1.0)

## 2011-11-20 LAB — CBC
HCT: 20.1 % — ABNORMAL LOW (ref 36.0–46.0)
Hemoglobin: 7.1 g/dL — ABNORMAL LOW (ref 12.0–15.0)
MCV: 95.3 fL (ref 78.0–100.0)
Platelets: 281 10*3/uL (ref 150–400)
RBC: 2.11 MIL/uL — ABNORMAL LOW (ref 3.87–5.11)
WBC: 6.6 10*3/uL (ref 4.0–10.5)

## 2011-11-20 LAB — URINE MICROSCOPIC-ADD ON

## 2011-11-20 LAB — POCT I-STAT, CHEM 8
BUN: 46 mg/dL — ABNORMAL HIGH (ref 6–23)
Creatinine, Ser: 2.4 mg/dL — ABNORMAL HIGH (ref 0.50–1.10)
Hemoglobin: 7.5 g/dL — ABNORMAL LOW (ref 12.0–15.0)
Potassium: 4.6 mEq/L (ref 3.5–5.1)
Sodium: 120 mEq/L — ABNORMAL LOW (ref 135–145)
TCO2: 26 mmol/L (ref 0–100)

## 2011-11-20 LAB — BASIC METABOLIC PANEL
BUN: 45 mg/dL — ABNORMAL HIGH (ref 6–23)
BUN: 48 mg/dL — ABNORMAL HIGH (ref 6–23)
CO2: 27 mEq/L (ref 19–32)
Calcium: 8.5 mg/dL (ref 8.4–10.5)
Chloride: 84 mEq/L — ABNORMAL LOW (ref 96–112)
GFR calc non Af Amer: 18 mL/min — ABNORMAL LOW (ref >90)
GFR calc non Af Amer: 15 mL/min — ABNORMAL LOW (ref >90)
Glucose, Bld: 98 mg/dL (ref 70–99)
Glucose, Bld: 115 mg/dL — ABNORMAL HIGH (ref 70–99)
Potassium: 4.5 mEq/L (ref 3.5–5.1)
Potassium: 5 mEq/L (ref 3.5–5.1)
Sodium: 121 mEq/L — ABNORMAL LOW (ref 135–145)

## 2011-11-20 LAB — PROTIME-INR: INR: 2.15 — ABNORMAL HIGH (ref 0.00–1.49)

## 2011-11-20 LAB — CBC WITH DIFFERENTIAL/PLATELET
Basophils Absolute: 0 10*3/uL (ref 0.0–0.1)
HCT: 21.1 % — ABNORMAL LOW (ref 36.0–46.0)
Lymphocytes Relative: 11 % — ABNORMAL LOW (ref 12–46)
Monocytes Absolute: 1 10*3/uL (ref 0.1–1.0)
Neutro Abs: 5.6 10*3/uL (ref 1.7–7.7)
Neutrophils Relative %: 75 % (ref 43–77)
RDW: 14.8 % (ref 11.5–15.5)
WBC: 7.5 10*3/uL (ref 4.0–10.5)

## 2011-11-20 LAB — POCT I-STAT TROPONIN I: Troponin i, poc: 0.02 ng/mL (ref 0.00–0.08)

## 2011-11-20 LAB — COMPREHENSIVE METABOLIC PANEL
ALT: 15 U/L (ref 0–35)
AST: 51 U/L — ABNORMAL HIGH (ref 0–37)
Albumin: 2.8 g/dL — ABNORMAL LOW (ref 3.5–5.2)
Alkaline Phosphatase: 99 U/L (ref 39–117)
CO2: 28 mEq/L (ref 19–32)
Chloride: 82 mEq/L — ABNORMAL LOW (ref 96–112)
Creatinine, Ser: 2.29 mg/dL — ABNORMAL HIGH (ref 0.50–1.10)
GFR calc non Af Amer: 18 mL/min — ABNORMAL LOW (ref >90)
Potassium: 4.5 mEq/L (ref 3.5–5.1)
Sodium: 119 mEq/L — CL (ref 135–145)
Total Bilirubin: 0.5 mg/dL (ref 0.3–1.2)

## 2011-11-20 LAB — TROPONIN I: Troponin I: <0.3 ng/mL (ref <0.30)

## 2011-11-20 MED ORDER — WARFARIN SODIUM 4 MG PO TABS
4.0000 mg | ORAL_TABLET | Freq: Once | ORAL | Status: AC
  Administered 2011-11-20: 4 mg via ORAL
  Filled 2011-11-20: qty 1

## 2011-11-20 MED ORDER — ONDANSETRON HCL 4 MG/2ML IJ SOLN: 4.0000 mg | Freq: Three times a day (TID) | INTRAMUSCULAR | Status: AC | PRN

## 2011-11-20 MED ORDER — ONDANSETRON HCL 4 MG/2ML IJ SOLN: 4.0000 mg | Freq: Four times a day (QID) | INTRAMUSCULAR | Status: DC | PRN

## 2011-11-20 MED ORDER — OXYCODONE HCL 5 MG PO TABS: 5.0000 mg | ORAL_TABLET | ORAL | Status: DC | PRN

## 2011-11-20 MED ORDER — ACETAMINOPHEN 325 MG PO TABS: 650.0000 mg | ORAL_TABLET | Freq: Four times a day (QID) | ORAL | Status: DC | PRN

## 2011-11-20 MED ORDER — MIRTAZAPINE 15 MG PO TABS
15.0000 mg | ORAL_TABLET | Freq: Every evening | ORAL | Status: DC | PRN
  Administered 2011-11-20 – 2011-11-23 (×2): 15 mg via ORAL
  Filled 2011-11-20 (×3): qty 1

## 2011-11-20 MED ORDER — ONDANSETRON HCL 4 MG/2ML IJ SOLN
4.0000 mg | Freq: Once | INTRAMUSCULAR | Status: AC
  Administered 2011-11-20: 4 mg via INTRAVENOUS
  Filled 2011-11-20: qty 2

## 2011-11-20 MED ORDER — WARFARIN - PHARMACIST DOSING INPATIENT: Freq: Every day | Status: DC

## 2011-11-20 MED ORDER — SODIUM CHLORIDE 0.9 % IV BOLUS (SEPSIS)
250.0000 mL | Freq: Once | INTRAVENOUS | Status: AC
  Administered 2011-11-20: 250 mL via INTRAVENOUS

## 2011-11-20 MED ORDER — PANTOPRAZOLE SODIUM 40 MG PO TBEC
40.0000 mg | DELAYED_RELEASE_TABLET | Freq: Every day | ORAL | Status: DC
  Administered 2011-11-20 – 2011-11-24 (×5): 40 mg via ORAL
  Filled 2011-11-20 (×5): qty 1

## 2011-11-20 MED ORDER — POLYETHYLENE GLYCOL 3350 17 G PO PACK
17.0000 g | PACK | Freq: Every day | ORAL | Status: DC
  Administered 2011-11-20 – 2011-11-24 (×5): 17 g via ORAL
  Filled 2011-11-20 (×5): qty 1

## 2011-11-20 MED ORDER — SODIUM CHLORIDE 0.9 % IJ SOLN
3.0000 mL | Freq: Two times a day (BID) | INTRAMUSCULAR | Status: DC
  Administered 2011-11-20 – 2011-11-22 (×6): 3 mL via INTRAVENOUS
  Administered 2011-11-23: 11:00:00 via INTRAVENOUS
  Administered 2011-11-23 – 2011-11-24 (×2): 3 mL via INTRAVENOUS

## 2011-11-20 MED ORDER — ACETAMINOPHEN 650 MG RE SUPP: 650.0000 mg | Freq: Four times a day (QID) | RECTAL | Status: DC | PRN

## 2011-11-20 MED ORDER — SODIUM CHLORIDE 0.9 % IV SOLN
Freq: Once | INTRAVENOUS | Status: AC
  Administered 2011-11-20: 04:00:00 via INTRAVENOUS

## 2011-11-20 MED ORDER — SODIUM CHLORIDE 0.9 % IV SOLN
INTRAVENOUS | Status: AC
  Administered 2011-11-20: 22:00:00 via INTRAVENOUS

## 2011-11-20 MED ORDER — LEVOTHYROXINE SODIUM 75 MCG PO TABS
75.0000 ug | ORAL_TABLET | Freq: Every day | ORAL | Status: DC
  Administered 2011-11-21 – 2011-11-24 (×4): 75 ug via ORAL
  Filled 2011-11-20 (×5): qty 1

## 2011-11-20 MED ORDER — BIOTENE DRY MOUTH MT LIQD
15.0000 mL | Freq: Two times a day (BID) | OROMUCOSAL | Status: DC
  Administered 2011-11-20 – 2011-11-24 (×7): 15 mL via OROMUCOSAL

## 2011-11-20 MED ORDER — OXYBUTYNIN CHLORIDE ER 10 MG PO TB24
10.0000 mg | ORAL_TABLET | Freq: Every day | ORAL | Status: DC
  Administered 2011-11-20 – 2011-11-24 (×5): 10 mg via ORAL
  Filled 2011-11-20 (×5): qty 1

## 2011-11-20 MED ORDER — METOPROLOL TARTRATE 100 MG PO TABS
100.0000 mg | ORAL_TABLET | Freq: Two times a day (BID) | ORAL | Status: DC
  Administered 2011-11-20 – 2011-11-21 (×3): 100 mg via ORAL
  Filled 2011-11-20 (×4): qty 1

## 2011-11-20 MED ORDER — TRAMADOL HCL 50 MG PO TABS
25.0000 mg | ORAL_TABLET | Freq: Four times a day (QID) | ORAL | Status: DC | PRN
  Administered 2011-11-20: 25 mg via ORAL
  Filled 2011-11-20: qty 1

## 2011-11-20 MED ORDER — SODIUM CHLORIDE 0.9 % IV SOLN: INTRAVENOUS | Status: AC

## 2011-11-20 MED ORDER — ONDANSETRON HCL 4 MG PO TABS: 4.0000 mg | ORAL_TABLET | Freq: Four times a day (QID) | ORAL | Status: DC | PRN

## 2011-11-20 NOTE — Progress Notes (Signed)
VASCULAR LAB PRELIMINARY  PRELIMINARY  PRELIMINARY  PRELIMINARY  Left upper extremity venous Doppler completed.    Preliminary report:  There is no obvious DVT or SVT noted in the left upper extremity.  Incidentally, there appears to be a narrowing in the fistula in the mid upper arm with a significant increase in velocities.  Palestine Mosco, 11/20/2011, 12:13 PM

## 2011-11-20 NOTE — ED Notes (Signed)
The patient's daughter states that the patient has been feeling unwell since she had surgery to implant a fistula on 10/24.  The patient has been experiencing N/V/D since 11/18/11.

## 2011-11-20 NOTE — Progress Notes (Signed)
Pt admitted for generalized weakness/hypotension likely related to decreased po intake due to nausea. IV bolus 250 given ED and fluids at 75/hr for 12 hrs. VSS  Complains pain in left arm: AV fistula placement about a week ago. Site with some induration/erythema/edema. Somewhat warm to touch. Very painful. +pulse and thrill. Dopplers pending. Afebrile, wc within normal limits, non-toxic appearing.   Have requested consult from vascular.  BMP q 12 hours for low Na  Marlin Canary DO

## 2011-11-20 NOTE — H&P (Signed)
Cheryl [name] is an 78 y.o. female. Patient was seen and examined on November 20, 2011.  Chief Complaint: Weakness. HPI: 78 year-old female with history of chronic kidney disease stage 4-5, atrial fibrillation and history of PE on Coumadin, hypertension, pulmonary hypertension, anemia, history of temporal arteritis, who had undergone AV fistula placement in the left upper extremity one week ago presents to the year because of weakness. Patient states that over the last 2-3 days she has been feeling very weak to the point that she is not even able to walk up to the bathroom. Doing so makes her feel dizzy. Denies any nausea vomiting or any abdominal pain or diarrhea denies any shortness of breath or chest pain. In the ER patient was found to be anemic more than her usual and hyponatremic. Initially patient was found to be mildly hypotensive. Patient's blood pressure improved with 250 cc normal saline bolus. At this time patient will be admitted for further management. Patient has been noticed to have swelling in the left arm where she had surgery but does not look infected. The AV fistula has good thrill.  Past Medical History  Diagnosis Date  . HTN (hypertension)     2 YEARS  . Hypothyroidism   . Temporal arteritis   . Renal insufficiency   . Pulmonary embolism   . Mitral regurgitation     MILD  . Heart failure     PROBABLE WITH A WELL- PRESERVED EJRCTION FRACTION  . Cataracta   . Chronic anemia   . Paroxysmal atrial fibrillation   . Pulmonary HTN     MOD  . S/P cardiac cath     RT.HRT CATH 12/2009: RA MEAN 12, RV 45/11,PA 47/21,PCWP MEAN 26, CO 4.56, CI 2.7 ABNORMAL MYOVIEW 12/2009  . Ischemia     ANTERIOR,EF 75%----NO CATH DONE DUE TO CKD  . CHF (congestive heart failure)   . Irregular heart beat   . Pulmonary embolism 1997  . End stage renal disease   . History of urinary frequency   . GERD (gastroesophageal reflux disease)   . Sleep trouble   . Constipation, chronic   . Hard  of hearing     Past Surgical History  Procedure Date  . Temporal arteny bx   . Cataract surgery   . Eye surgery   . Abdominal hysterectomy     Family History  Problem Relation Age of Onset  . Heart attack Mother 56  . Stroke Father 70  . Hypertension Sister   . Other      THERE IS NO EARLY HEART DISEASE  . Cancer Brother     prostate  . Diabetes Daughter   . Hyperlipidemia Daughter   . Hypertension Daughter    Social History:  reports that she has never smoked. She has never used smokeless tobacco. She reports that she does not drink alcohol or use illicit drugs.  Allergies:  Allergies  Allergen Reactions  . Codeine Other (See Comments)    unknown  . Sulfonamide Derivatives Other (See Comments)    unknown    Medications Prior to Admission  Medication Sig Dispense Refill  . amLODipine (NORVASC) 5 MG tablet Take 5 mg by mouth daily.        . Cholecalciferol (VITAMIN D) 400 UNITS capsule Take 800 Units by mouth daily.       [name] enoxaparin (LOVENOX) 30 MG/0.3ML injection Inject 30 mg into the skin Twice daily.      . furosemide (LASIX) 20 MG  tablet Take 20 mg by mouth 2 (two) times daily.       [name] levothyroxine (SYNTHROID, LEVOTHROID) 75 MCG tablet Take 75 mcg by mouth daily.        . Melatonin 5 MG TABS Take 1 tablet by mouth at bedtime.        . metoprolol (LOPRESSOR) 100 MG tablet Take 100 mg by mouth 2 (two) times daily.      . mirtazapine (REMERON) 15 MG tablet Take 15 mg by mouth at bedtime as needed. For sleep      . multivitamin (THERAGRAN) per tablet Take 1 tablet by mouth daily.        [name] omeprazole (PRILOSEC) 20 MG capsule Take 20 mg by mouth daily.        [name] oxybutynin (DITROPAN-XL) 10 MG 24 hr tablet Take 10 mg by mouth daily.      [name] oxyCODONE (ROXICODONE) 5 MG immediate release tablet Take 1 tablet (5 mg total) by mouth every 4 (four) hours as needed for pain.  30 tablet  0  . polyethylene glycol powder (GLYCOLAX/MIRALAX) powder Take 17 g by mouth daily as  needed. For constipation      . promethazine (PHENERGAN) 12.5 MG tablet Take 1 tablet (12.5 mg total) by mouth every 4 (four) hours as needed for nausea.  30 tablet  0  . warfarin (COUMADIN) 1 MG tablet Take 1 mg by mouth daily. Takes 1 mg along with 3 mg for a total 4 mg daily      . warfarin (COUMADIN) 3 MG tablet Take 3 mg by mouth daily. Takes 3 mg along with a 1 mg tablet for total of 4 mg daily        Results for orders placed during the hospital encounter of 11/20/11 (from the past 48 hour(s))  CBC WITH DIFFERENTIAL     Status: Abnormal   Collection Time   11/20/11  2:10 AM      Component Value Range Comment   WBC 7.5  4.0 - 10.5 K/uL    RBC 2.21 (*) 3.87 - 5.11 MIL/uL    Hemoglobin 7.5 (*) 12.0 - 15.0 g/dL    HCT 16.1 (*) 09.6 - 46.0 %    MCV 95.5  78.0 - 100.0 fL    MCH 33.9  26.0 - 34.0 pg    MCHC 35.5  30.0 - 36.0 g/dL    RDW 04.5  40.9 - 81.1 %    Platelets 269  150 - 400 K/uL    Neutrophils Relative 75  43 - 77 %    Neutro Abs 5.6  1.7 - 7.7 K/uL    Lymphocytes Relative 11 (*) 12 - 46 %    Lymphs Abs 0.8  0.7 - 4.0 K/uL    Monocytes Relative 13 (*) 3 - 12 %    Monocytes Absolute 1.0  0.1 - 1.0 K/uL    Eosinophils Relative 2  0 - 5 %    Eosinophils Absolute 0.1  0.0 - 0.7 K/uL    Basophils Relative 0  0 - 1 %    Basophils Absolute 0.0  0.0 - 0.1 K/uL   COMPREHENSIVE METABOLIC PANEL     Status: Abnormal   Collection Time   11/20/11  2:10 AM      Component Value Range Comment   Sodium 119 (*) 135 - 145 mEq/L    Potassium 4.5  3.5 - 5.1 mEq/L    Chloride 82 (*) 96 - 112 mEq/L  CO2 28  19 - 32 mEq/L    Glucose, Bld 120 (*) 70 - 99 mg/dL    BUN 47 (*) 6 - 23 mg/dL    Creatinine, Ser 1.61 (*) 0.50 - 1.10 mg/dL    Calcium 8.9  8.4 - 09.6 mg/dL    Total Protein 7.1  6.0 - 8.3 g/dL    Albumin 2.8 (*) 3.5 - 5.2 g/dL    AST 51 (*) 0 - 37 U/L    ALT 15  0 - 35 U/L    Alkaline Phosphatase 99  39 - 117 U/L    Total Bilirubin 0.5  0.3 - 1.2 mg/dL    GFR calc non Af Amer  18 (*) >90 mL/min    GFR calc Af Amer 21 (*) >90 mL/min   APTT     Status: Abnormal   Collection Time   11/20/11  2:10 AM      Component Value Range Comment   aPTT 58 (*) 24 - 37 seconds   PROTIME-INR     Status: Abnormal   Collection Time   11/20/11  2:10 AM      Component Value Range Comment   Prothrombin Time 23.1 (*) 11.6 - 15.2 seconds    INR 2.15 (*) 0.00 - 1.49   LACTIC ACID, PLASMA     Status: Normal   Collection Time   11/20/11  2:11 AM      Component Value Range Comment   Lactic Acid, Venous 0.7  0.5 - 2.2 mmol/L   POCT I-STAT TROPONIN I     Status: Normal   Collection Time   11/20/11  2:19 AM      Component Value Range Comment   Troponin i, poc 0.02  0.00 - 0.08 ng/mL    Comment 3            POCT I-STAT, CHEM 8     Status: Abnormal   Collection Time   11/20/11  2:21 AM      Component Value Range Comment   Sodium 120 (*) 135 - 145 mEq/L    Potassium 4.6  3.5 - 5.1 mEq/L    Chloride 84 (*) 96 - 112 mEq/L    BUN 46 (*) 6 - 23 mg/dL    Creatinine, Ser 0.45 (*) 0.50 - 1.10 mg/dL    Glucose, Bld 409 (*) 70 - 99 mg/dL    Calcium, Ion 8.11 (*) 1.13 - 1.30 mmol/L    TCO2 26  0 - 100 mmol/L    Hemoglobin 7.5 (*) 12.0 - 15.0 g/dL    HCT 91.4 (*) 78.2 - 46.0 %   URINALYSIS, ROUTINE W REFLEX MICROSCOPIC     Status: Abnormal   Collection Time   11/20/11  3:27 AM      Component Value Range Comment   Color, Urine YELLOW  YELLOW    APPearance CLEAR  CLEAR    Specific Gravity, Urine 1.007  1.005 - 1.030    pH 6.0  5.0 - 8.0    Glucose, UA NEGATIVE  NEGATIVE mg/dL    Hgb urine dipstick TRACE (*) NEGATIVE    Bilirubin Urine NEGATIVE  NEGATIVE    Ketones, ur NEGATIVE  NEGATIVE mg/dL    Protein, ur 30 (*) NEGATIVE mg/dL    Urobilinogen, UA 1.0  0.0 - 1.0 mg/dL    Nitrite NEGATIVE  NEGATIVE    Leukocytes, UA SMALL (*) NEGATIVE   URINE MICROSCOPIC-ADD ON     Status: Normal   Collection  Time   11/20/11  3:27 AM      Component Value Range Comment   Squamous Epithelial / LPF  RARE  RARE    WBC, UA 7-10  <3 WBC/hpf    RBC / HPF 0-2  <3 RBC/hpf    Bacteria, UA RARE  RARE    Dg Chest Port 1 View  11/20/2011  *RADIOLOGY REPORT*  Clinical Data: Cough.  Weakness.  PORTABLE CHEST - 1 VIEW  Comparison: PA and lateral chest 11/13/2011.  Findings: There is cardiomegaly but no pulmonary edema.  Lung volumes are low with some subsegmental atelectasis in the bases. No pneumothorax or pleural fluid.  IMPRESSION: Cardiomegaly without acute disease.   Original Report Authenticated By: Bernadene Bell. Maricela Curet, M.D.     Review of Systems  Constitutional: Positive for malaise/fatigue.  HENT: Negative.   Eyes: Negative.   Respiratory: Negative.   Cardiovascular: Negative.   Gastrointestinal: Negative.   Genitourinary: Negative.   Musculoskeletal: Negative.   Skin: Negative.   Neurological: Positive for weakness.  Endo/Heme/Allergies: Negative.   Psychiatric/Behavioral: Negative.     Blood pressure 129/61, pulse 63, temperature 97.9 F (36.6 C), temperature source Oral, resp. rate 18, height 5\' 2"  (1.575 m), weight 69.083 kg (152 lb 4.8 oz), SpO2 100.00%. Physical Exam  Constitutional: She is oriented to person, place, and time. She appears well-developed and well-nourished. No distress.  HENT:  Head: Normocephalic and atraumatic.  Right Ear: External ear normal.  Left Ear: External ear normal.  Nose: Nose normal.  Mouth/Throat: Oropharynx is clear and moist. No oropharyngeal exudate.  Eyes: Conjunctivae normal are normal. Pupils are equal, round, and reactive to light. Right eye exhibits no discharge. Left eye exhibits no discharge. No scleral icterus.  Neck: Normal range of motion. Neck supple.  Cardiovascular: Normal rate and regular rhythm.   Respiratory: Effort normal and breath sounds normal. No respiratory distress. She has no wheezes. She has no rales.  GI: Soft. Bowel sounds are normal. She exhibits no distension. There is no tenderness. There is no rebound and no  guarding.  Musculoskeletal: She exhibits no edema and no tenderness.  Neurological: She is alert and oriented to person, place, and time.  Skin: Skin is warm and dry. She is not diaphoretic.     Assessment/Plan #1. Generalized weakness with hyponatremia and anemia in a patient with known end-stage renal disease - patient's weakness is most likely from poor oral intake causing dehydration and resultant hyponatremia. This weakness probably further worsened by her worsening anemia. At this time patient was placed on 250 cc normal saline bolus and at this time normal saline at 75 cc per hour has been ordered for 12 hours taking into consideration the patient has poor renal function careful not to overload. I have ordered a repeat metabolic panel now to closely follow her sodium levels and also repeat CBC now. If there is further drop in hemoglobin patient may need transfusion and based on her sodium trends fluid has to be adjusted. Check TSH and cardiac enzymes also. #2. Recent left upper extremity AV fistula placement - patient's left arm looks swollen but does not look infected. Patient has got good pulse and the AV fistula has good thrill. Please consult Dr. Hart Rochester for further recommendation given patient's increased swelling in the left upper extremity. We'll order Dopplers of the left upper extremity to rule out DVT. #3. History of atrial fibrillation and PE on Coumadin - continue Coumadin per pharmacy. Presently rate is controlled continue Toprol. #4. History  of hypertension - since patient is feeling weak and also her initial blood pressure was low so I have held amlodipine and also Lasix. Closely follow blood pressure trends. #5. History of hypothyroidism - continue Synthroid and check TSH. #6. History of pulmonary hypertension. #7. History of temporal arteritis.  CODE STATUS - full code.  Eduard Clos. 11/20/2011, 6:45 AM

## 2011-11-20 NOTE — ED Provider Notes (Signed)
History     CSN: [id number]  Arrival date & time 11/20/11  0118   First MD Initiated Contact with Patient 11/20/11 0159      Chief Complaint  Patient presents with  . Nausea  . Emesis  . Diarrhea    (Consider location/radiation/quality/duration/timing/severity/associated sxs/prior treatment) HPI Comments: 78 year old female with a history of renal failure, pulmonary embolism, congestive heart failure, pulmonary hypertension who presents with the complaint of generalized weakness. According to the patient and the family members she had a surgical procedure to place a fistula in her left upper extremity for future dialysis access that was performed approximately 6 days ago. Since that time she's had generalized weakness this is persistent, gradually worsening, associated with several days of nausea vomiting and now diarrhea. Nothing seems to make this better or worse, she has had a gradual decline. She does have associated coughing since the procedure. She has been able to drink fluids but has had no appetite and has not had any solid food in several days.  The history is provided by the patient, a relative and medical records.    Past Medical History  Diagnosis Date  . HTN (hypertension)     2 YEARS  . Hypothyroidism   . Temporal arteritis   . Renal insufficiency   . Pulmonary embolism   . Mitral regurgitation     MILD  . Heart failure     PROBABLE WITH A WELL- PRESERVED EJRCTION FRACTION  . Cataracta   . Chronic anemia   . Paroxysmal atrial fibrillation   . Pulmonary HTN     MOD  . S/P cardiac cath     RT.HRT CATH 12/2009: RA MEAN 12, RV 45/11,PA 47/21,PCWP MEAN 26, CO 4.56, CI 2.7 ABNORMAL MYOVIEW 12/2009  . Ischemia     ANTERIOR,EF 75%----NO CATH DONE DUE TO CKD  . CHF (congestive heart failure)   . Irregular heart beat   . Pulmonary embolism 1997  . End stage renal disease   . History of urinary frequency   . GERD (gastroesophageal reflux disease)   . Sleep  trouble   . Constipation, chronic   . Hard of hearing     Past Surgical History  Procedure Date  . Temporal arteny bx   . Cataract surgery   . Eye surgery   . Abdominal hysterectomy     Family History  Problem Relation Age of Onset  . Heart attack Mother 44  . Stroke Father 16  . Hypertension Sister   . Other      THERE IS NO EARLY HEART DISEASE  . Cancer Brother     prostate  . Diabetes Daughter   . Hyperlipidemia Daughter   . Hypertension Daughter     History  Substance Use Topics  . Smoking status: Never Smoker   . Smokeless tobacco: Never Used  . Alcohol Use: No    OB History    Grav Para Term Preterm Abortions TAB SAB Ect Mult Living                  Review of Systems  All other systems reviewed and are negative.    Allergies  Codeine and Sulfonamide derivatives  Home Medications   Current Outpatient Rx  Name Route Sig Dispense Refill  . AMLODIPINE BESYLATE 5 MG PO TABS Oral Take 5 mg by mouth daily.      [name] VITAMIN D 400 UNITS PO CAPS Oral Take 800 Units by mouth daily.     [name]  FUROSEMIDE 20 MG PO TABS Oral Take 20 mg by mouth 2 (two) times daily.     [name] LEVOTHYROXINE SODIUM 75 MCG PO TABS Oral Take 75 mcg by mouth daily.      [name] MELATONIN 5 MG PO TABS Oral Take 1 tablet by mouth at bedtime.      [name] METOPROLOL TARTRATE 100 MG PO TABS Oral Take 100 mg by mouth 2 (two) times daily.    [name] MIRTAZAPINE 15 MG PO TABS Oral Take 15 mg by mouth at bedtime as needed. For sleep    . MULTIVITAMINS PO TABS Oral Take 1 tablet by mouth daily.      [name] OMEPRAZOLE 20 MG PO CPDR Oral Take 20 mg by mouth daily.      . OXYBUTYNIN CHLORIDE ER 10 MG PO TB24 Oral Take 10 mg by mouth daily.    . OXYCODONE HCL 5 MG PO TABS Oral Take 1 tablet (5 mg total) by mouth every 4 (four) hours as needed for pain. 30 tablet 0  . POLYETHYLENE GLYCOL 3350 PO POWD Oral Take 17 g by mouth daily as needed. For constipation    . PROMETHAZINE HCL 12.5 MG PO TABS Oral Take 1 tablet (12.5 mg total)  by mouth every 4 (four) hours as needed for nausea. 30 tablet 0  . WARFARIN SODIUM 1 MG PO TABS Oral Take 1 mg by mouth daily. Takes 1 mg along with 3 mg for a total 4 mg daily    . WARFARIN SODIUM 3 MG PO TABS Oral Take 3 mg by mouth daily. Takes 3 mg along with a 1 mg tablet for total of 4 mg daily      BP 92/51  Pulse 79  Temp 98.5 F (36.9 C) (Oral)  Resp 16  Ht 5' 2.5" (1.588 m)  SpO2 96%  Physical Exam  Nursing note and vitals reviewed. Constitutional: She appears well-developed and well-nourished.       Uncomfortable appearing  HENT:  Head: Normocephalic and atraumatic.  Mouth/Throat: No oropharyngeal exudate.       Mucous membranes dry  Eyes: Conjunctivae normal and EOM are normal. Pupils are equal, round, and reactive to light. Right eye exhibits no discharge. Left eye exhibits no discharge. No scleral icterus.  Neck: Normal range of motion. Neck supple. No JVD present. No thyromegaly present.  Cardiovascular: Normal rate, regular rhythm, normal heart sounds and intact distal pulses.  Exam reveals no gallop and no friction rub.   No murmur heard. Pulmonary/Chest: Effort normal. No respiratory distress. She has no wheezes (bilateral rales at the bases). She has rales.       Speaks in full sentences, no increased work of breathing  Abdominal: Soft. Bowel sounds are normal. She exhibits no distension and no mass. There is no tenderness.  Musculoskeletal: Normal range of motion. She exhibits tenderness.       Left upper extremity with fistula with good thrill, significant surrounding erythema and induration  Lymphadenopathy:    She has no cervical adenopathy.  Neurological: She is alert. Coordination normal.  Skin: Skin is warm and dry. There is erythema.       Erythema and induration of the left upper extremity proximal to the antecubital fossa  Psychiatric: She has a normal mood and affect. Her behavior is normal.    ED Course  Procedures (including critical care  time)  Labs Reviewed  CBC WITH DIFFERENTIAL - Abnormal; Notable for the following:    RBC 2.21 (*)  Hemoglobin 7.5 (*)     HCT 21.1 (*)     Lymphocytes Relative 11 (*)     Monocytes Relative 13 (*)     All other components within normal limits  COMPREHENSIVE METABOLIC PANEL - Abnormal; Notable for the following:    Sodium 119 (*)     Chloride 82 (*)     Glucose, Bld 120 (*)     BUN 47 (*)     Creatinine, Ser 2.29 (*)     Albumin 2.8 (*)     AST 51 (*)     GFR calc non Af Amer 18 (*)     GFR calc Af Amer 21 (*)     All other components within normal limits  APTT - Abnormal; Notable for the following:    aPTT 58 (*)     All other components within normal limits  PROTIME-INR - Abnormal; Notable for the following:    Prothrombin Time 23.1 (*)     INR 2.15 (*)     All other components within normal limits  POCT I-STAT, CHEM 8 - Abnormal; Notable for the following:    Sodium 120 (*)     Chloride 84 (*)     BUN 46 (*)     Creatinine, Ser 2.40 (*)     Glucose, Bld 116 (*)     Calcium, Ion 1.11 (*)     Hemoglobin 7.5 (*)     HCT 22.0 (*)     All other components within normal limits  LACTIC ACID, PLASMA  POCT I-STAT TROPONIN I  URINALYSIS, ROUTINE W REFLEX MICROSCOPIC   Dg Chest Port 1 View  11/20/2011  *RADIOLOGY REPORT*  Clinical Data: Cough.  Weakness.  PORTABLE CHEST - 1 VIEW  Comparison: PA and lateral chest 11/13/2011.  Findings: There is cardiomegaly but no pulmonary edema.  Lung volumes are low with some subsegmental atelectasis in the bases. No pneumothorax or pleural fluid.  IMPRESSION: Cardiomegaly without acute disease.   Original Report Authenticated By: Bernadene Bell. D'ALESSIO, M.D.      1. Hyponatremia   2. Anemia   3. Generalized weakness       MDM  The patient is anticoagulated on Coumadin, she had a recent surgical procedure at this time and according to the family members has had significant improvement in the appearance of the left upper extremity  though they state that they have not have a recheck at their surgeon's office. There does appear to be some edema to the skin, some induration and redness there is minimal warmth. The fistula appears to be patent with a good thrill. She does have rales at the bases bilaterally but no significant respiratory distress or increased work of breathing. There is concern for ongoing nausea and vomiting in the presence of her pre-existing renal failure. We'll check EKG and laboratory data to ensure no hyperkalemia or worsening renal function, CBC, chest x-ray.    ED ECG REPORT  I personally interpreted this EKG   Date: 11/20/2011   Rate: 6  Rhythm: normal sinus rhythm  QRS Axis: normal  Intervals: PR prolonged  ST/T Wave abnormalities: normal  Conduction Disutrbances:first-degree A-V block  and right bundle branch block  Narrative Interpretation:   Old EKG Reviewed: Compared with 07/14/1998 and, PR interval prolonged   Pt has had stable MS since arrival but now has some relative hypotension with BP of 92 systolic on last check - pulse is 60 and MS is awake and alert - I  have requested admission due to significant hyponatremia.  The anemia is likely secondary to her procedure and the bruising and bleeding at the surgical sitge.  She has no fever and no tachycardia and no leukocytosis.  The x-ray shows no signs of pulmonary edema or infiltrate. Her oxygen levels are maintained at 100%. The hyponatremia is significant and normal saline has been started.  Will consult hospitalist for admission.   Vida Roller, MD 11/20/11 [phone]

## 2011-11-20 NOTE — Progress Notes (Addendum)
[facility] [place] Postoperative Visit hemodialysis access   Date of Surgery:  11/13/11 Surgeon: Hart Rochester  HPI:  This is a 78 y.o. female who is hospitalized with generalized weakness and anemia.  She underwent a BVT a week ago.  She is having pain in her LUE.  PHYSICAL EXAMINATION:  Filed Vitals:   11/20/11 1152  BP: 125/47  Pulse:   Temp:   Resp:      Incision is c/d/i -no infection present Hand grip is equal bilaterally and sensation in digits is intact; There is  Thrill; there is bruit. The graft/fistula is easily palpable  Pulse:  Easily palpable left radial pulse Extremity:  LUE is with some edema and ecchymosis/warmth.  ASSESSMENT/PLAN:  Cheryl [name] is a 78 y.o. year old female who presents s/p Left BVT one week ago.  -pt has a good thrill/bruit within the fistula. -there is no evidence of infection-pt's WBC is normal and pt has been afebrile. -suspect there was some bleeding in the tunnel for the fistula, which is causing her ecchymosis.  This will resolve over time. -She will f/u with Dr. Hart Rochester on 12/23/11 (per pt's daughter).  To call the office before then if they have any further concerns/questions.  Doreatha Massed, PA-C [facility] [phone]  Addendum  I have independently interviewed and examined the patient, and I agree with the physician assistant's findings.  No evidence on exam for infection, rather echymosis due to the recent operation.  Leonides Sake, MD [facility] [place] Office: [phone] Pager: [phone]  11/20/2011, 2:08 PM

## 2011-11-20 NOTE — Progress Notes (Signed)
Utilization Review Completed.  

## 2011-11-20 NOTE — Progress Notes (Signed)
PT Cancellation Note  Patient Details Name: Cheryl [name] MRN: [id number] DOB: June 21, 1925   Cancelled Treatment:    Reason Eval/Treat Not Completed: Patient not medically ready.  Patient on bedrest x 12 hours per order.  Will return in am as bedrest until 1832 today per order.  Thanks.   INGOLD,Garrus Gauthreaux 11/20/2011, 11:45 AM [facility] [phone] (pager)

## 2011-11-20 NOTE — Progress Notes (Signed)
ANTICOAGULATION CONSULT NOTE - Initial Consult  Pharmacy Consult for coumadin Indication: hx of PE and afib  Allergies  Allergen Reactions  . Codeine Other (See Comments)    unknown  . Sulfonamide Derivatives Other (See Comments)    unknown    Patient Measurements: Height: 5\' 2"  (157.5 cm) Weight: 152 lb 4.8 oz (69.083 kg) (SCALE b) IBW/kg (Calculated) : 50.1  Heparin Dosing Weight:   Vital Signs: Temp: 97.9 F (36.6 C) (10/31 0531) Temp src: Oral (10/31 0531) BP: 129/61 mmHg (10/31 0531) Pulse Rate: 63  (10/31 0531)  Labs:  Basename 11/20/11 0221 11/20/11 0210  HGB 7.5* 7.5*  HCT 22.0* 21.1*  PLT -- 269  APTT -- 58*  LABPROT -- 23.1*  INR -- 2.15*  HEPARINUNFRC -- --  CREATININE 2.40* 2.29*  CKTOTAL -- --  CKMB -- --  TROPONINI -- --    Estimated Creatinine Clearance: 15.9 ml/min (by C-G formula based on Cr of 2.4).   Medical History: Past Medical History  Diagnosis Date  . HTN (hypertension)     2 YEARS  . Hypothyroidism   . Temporal arteritis   . Renal insufficiency   . Pulmonary embolism   . Mitral regurgitation     MILD  . Heart failure     PROBABLE WITH A WELL- PRESERVED EJRCTION FRACTION  . Cataracta   . Chronic anemia   . Paroxysmal atrial fibrillation   . Pulmonary HTN     MOD  . S/P cardiac cath     RT.HRT CATH 12/2009: RA MEAN 12, RV 45/11,PA 47/21,PCWP MEAN 26, CO 4.56, CI 2.7 ABNORMAL MYOVIEW 12/2009  . Ischemia     ANTERIOR,EF 75%----NO CATH DONE DUE TO CKD  . CHF (congestive heart failure)   . Irregular heart beat   . Pulmonary embolism 1997  . End stage renal disease   . History of urinary frequency   . GERD (gastroesophageal reflux disease)   . Sleep trouble   . Constipation, chronic   . Hard of hearing     Medications:  Scheduled:    . sodium chloride   Intravenous Once  . sodium chloride   Intravenous STAT  . levothyroxine  75 mcg Oral QAC breakfast  . metoprolol  100 mg Oral BID  . ondansetron (ZOFRAN) IV  4 mg  Intravenous Once  . oxybutynin  10 mg Oral Daily  . sodium chloride  250 mL Intravenous Once  . sodium chloride  3 mL Intravenous Q12H   Infusions:    . sodium chloride      Assessment: 78 yo female with afib and hx of PE will be continued on coumadin.  Admitting INR was 2.15.  H/H 7.5/22.  Home coumadin dose is 4mg  po qday  Goal of Therapy:  INR 2-3    Plan:  1) Coumadin 4mg  po x1 2) Daily PT/INR 3) Monitor H/H closely.  Rolando Hessling, Tsz-Yin 11/20/2011,6:34 AM

## 2011-11-20 NOTE — Progress Notes (Signed)
[facility] [place]  Pt underwent uneventful basilic vein transposition 7 days ago.  This an autologous fistula, so resistant to infection.  Also the time frame is not consistent with infection.  Patient can follow up in the office with Dr. Hart Rochester at the scheduled appointment.  Leonides Sake, MD [facility] [place] Office: [phone] Pager: [phone]  11/20/2011, 12:48 PM

## 2011-11-21 DIAGNOSIS — I4891 Unspecified atrial fibrillation: Secondary | ICD-10-CM

## 2011-11-21 LAB — CBC
HCT: 20 % — ABNORMAL LOW (ref 36.0–46.0)
MCH: 34.5 pg — ABNORMAL HIGH (ref 26.0–34.0)
MCHC: 35 g/dL (ref 30.0–36.0)
RDW: 15.1 % (ref 11.5–15.5)

## 2011-11-21 LAB — BASIC METABOLIC PANEL
BUN: 46 mg/dL — ABNORMAL HIGH (ref 6–23)
Chloride: 89 mEq/L — ABNORMAL LOW (ref 96–112)
Creatinine, Ser: 2.6 mg/dL — ABNORMAL HIGH (ref 0.50–1.10)
GFR calc Af Amer: 18 mL/min — ABNORMAL LOW (ref >90)
GFR calc non Af Amer: 16 mL/min — ABNORMAL LOW (ref >90)
Glucose, Bld: 92 mg/dL (ref 70–99)

## 2011-11-21 LAB — PROTIME-INR: INR: 2.28 — ABNORMAL HIGH (ref 0.00–1.49)

## 2011-11-21 MED ORDER — SODIUM CHLORIDE 0.9 % IV SOLN
INTRAVENOUS | Status: DC
  Administered 2011-11-21 – 2011-11-23 (×4): via INTRAVENOUS

## 2011-11-21 MED ORDER — LACTULOSE 10 GM/15ML PO SOLN
10.0000 g | Freq: Every day | ORAL | Status: DC | PRN
  Filled 2011-11-21: qty 15

## 2011-11-21 MED ORDER — ALBUTEROL SULFATE (5 MG/ML) 0.5% IN NEBU
2.5000 mg | INHALATION_SOLUTION | RESPIRATORY_TRACT | Status: DC | PRN
  Administered 2011-11-23: 2.5 mg via RESPIRATORY_TRACT
  Filled 2011-11-21: qty 0.5

## 2011-11-21 MED ORDER — METOPROLOL TARTRATE 50 MG PO TABS
50.0000 mg | ORAL_TABLET | Freq: Two times a day (BID) | ORAL | Status: DC
  Administered 2011-11-21 – 2011-11-24 (×6): 50 mg via ORAL
  Filled 2011-11-21 (×7): qty 1

## 2011-11-21 MED ORDER — WARFARIN SODIUM 4 MG PO TABS
4.0000 mg | ORAL_TABLET | Freq: Once | ORAL | Status: AC
  Administered 2011-11-21: 4 mg via ORAL
  Filled 2011-11-21: qty 1

## 2011-11-21 NOTE — Progress Notes (Signed)
1820 pt complaint of  Inability to cough out phlegm . No sob noted o2 sat 97. On room air . breathsounds clear , diminished on auscultation . Referred to Dr. Benjamine Mola with orders

## 2011-11-21 NOTE — Progress Notes (Signed)
ANTICOAGULATION CONSULT NOTE - Follow Up Consult  Pharmacy Consult for Coumadin Indication: History of PE and Atrial Fibrillation  Allergies  Allergen Reactions  . Codeine Other (See Comments)    unknown  . Sulfonamide Derivatives Other (See Comments)    unknown    Patient Measurements: Height: 5\' 2"  (157.5 cm) Weight: 152 lb 4.8 oz (69.083 kg) (SCALE b) IBW/kg (Calculated) : 50.1   Vital Signs: Temp: 98.2 F (36.8 C) (11/01 0540) Temp src: Oral (11/01 0540) BP: 101/69 mmHg (11/01 1022) Pulse Rate: 67  (11/01 1022)  Labs:  Basename 11/21/11 0605 11/20/11 1706 11/20/11 1238 11/20/11 0754 11/20/11 0221 11/20/11 0210  HGB 7.0* -- -- 7.1* -- --  HCT 20.0* -- -- 20.1* 22.0* --  PLT 299 -- -- 281 -- 269  APTT -- -- -- -- -- 58*  LABPROT 24.1* -- -- -- -- 23.1*  INR 2.28* -- -- -- -- 2.15*  HEPARINUNFRC -- -- -- -- -- --  CREATININE 2.60* 2.76* 2.29* -- -- --  CKTOTAL -- -- -- -- -- --  CKMB -- -- -- -- -- --  TROPONINI -- -- -- <0.30 -- --    Estimated Creatinine Clearance: 14.7 ml/min (by C-G formula based on Cr of 2.6).   Medications:  Scheduled:    . sodium chloride   Intravenous STAT  . antiseptic oral rinse  15 mL Mouth Rinse BID  . levothyroxine  75 mcg Oral QAC breakfast  . metoprolol  100 mg Oral BID  . oxybutynin  10 mg Oral Daily  . pantoprazole  40 mg Oral Daily  . polyethylene glycol  17 g Oral Daily  . sodium chloride  3 mL Intravenous Q12H  . warfarin  4 mg Oral ONCE-1800  . Warfarin - Pharmacist Dosing Inpatient   Does not apply q1800    Assessment: 71 YOF with history of pulmonary embolism and atrial fibrillation on chronic coumadin PTA admitted with weakness and low hemoglobin in setting of anemia. Home coumadin regimen was 4 mg daily and patient was therapeutic on admission. INR today remains therapeutic at 2.28. Hemoglobin is down at 7 and patient is being transfused 1 unit per primary team for anemia. No bleeding reported, however bruising is  noted at site of recent vascular procedure. Duplex on 10/31 was negative for DVT. Platelets are stable.   Goal of Therapy:  INR 2-3   Plan:  1. Coumadin 4 mg po x1 tonight. 2. Continue to monitor PT/INR- will consider scheduling home dose and MWF INR's if patient remains stable and hemoglobin improves.   Link Snuffer, PharmD, BCPS Clinical Pharmacist [phone] 11/21/2011,11:32 AM

## 2011-11-21 NOTE — Progress Notes (Signed)
1730 Blood transfusion  Completed without complicatin

## 2011-11-21 NOTE — Progress Notes (Signed)
0915 seen and evaluated by Dr. Benjamine Mola . Made her aware of lab results today : cbc and BMP .

## 2011-11-21 NOTE — Evaluation (Signed)
Physical Therapy Evaluation Patient Details Name: Cheryl [name] MRN: [id number] DOB: [birth year] Today's Date: 11/21/2011 Time:  -     PT Assessment / Plan / Recommendation Clinical Impression  Pt is status post a physical decline following surgical insertion of an AVF, currently with decreased general strenght and activity tolerance.  She will benefit from continued therapy.  Recommend SNF.  Pt and daughter acknowledged  the need for and expressed  interest in continued therapy to improve strength and activity tolerance in hopes of returning home independently.      PT Assessment  Patient needs continued PT services    Follow Up Recommendations  Other (comment) (SNF)    Does the patient have the potential to tolerate intense rehabilitation      Barriers to Discharge Decreased caregiver support      Equipment Recommendations  Rolling walker with 5" wheels    Recommendations for Other Services Other (comment) (SNF)   Frequency Min 3X/week    Precautions / Restrictions Precautions Precautions: Fall Restrictions Weight Bearing Restrictions: No   Pertinent Vitals/Pain Ambulated on RA, remained >90%O2Sats; no pain      Mobility  Transfers Transfers: Sit to Stand;Stand to Sit;Stand Pivot Transfers Sit to Stand: 4: Min assist (Pt= 80%) Stand to Sit: 4: Min guard (Pt=80%) Stand Pivot Transfers: 4: Min assist (Pt=80%) Details for Transfer Assistance: Pt required assistance with maintaining proper position/posture and balance in transfers Ambulation/Gait Ambulation/Gait Assistance: 4: Min assist (Pt=75%) Ambulation Distance (Feet): 75 Feet Assistive device: Rolling walker Ambulation/Gait Assistance Details: Pt did not have the strength to ambulate independently and required cues to walk inside the rolling walker, slow down, and walk tall. Gait Pattern: Step-through pattern;Decreased stride length;Trunk flexed Gait velocity: too quickly for safe ambulation Stairs:  No Wheelchair Mobility Wheelchair Mobility: No         Exercises Other Exercises Other Exercises: Pt states [facility] (if able) and does exercises there.  PT encouraged her to continue these seated LE exercises daily. (marches, LAQ, abduction, toe taps)   PT Diagnosis: Generalized weakness  PT Problem List: Decreased strength;Decreased range of motion;Decreased activity tolerance;Decreased balance;Decreased mobility;Decreased coordination;Decreased knowledge of use of DME;Decreased safety awareness PT Treatment Interventions: DME instruction;Gait training;Stair training;Functional mobility training;Therapeutic activities;Therapeutic exercise;Balance training;Patient/family education   PT Goals Acute Rehab PT Goals PT Goal Formulation: With patient Time For Goal Achievement: 12/05/11 Potential to Achieve Goals: Good Pt will go Sit to Stand: with modified independence;with upper extremity assist PT Goal: Sit to Stand - Progress: Goal set today Pt will Ambulate: >150 feet;with modified independence;with rolling walker PT Goal: Ambulate - Progress: Goal set today Pt will Go Up / Down Stairs: 3-5 stairs;with modified independence;with rail(s) PT Goal: Up/Down Stairs - Progress: Goal set today Pt will Perform Home Exercise Program: Independently PT Goal: Perform Home Exercise Program - Progress: Goal set today  Visit Information       Subjective Data  Subjective: Pt states she is feeling ok but needs go to the bathroom.  She states she is willing to go to further therapy if she can return home after and is ready to work on walking and exercises if that's what she needs to do.  Patient Stated Goal: To go home   Prior Functioning  Home Living Lives With: Alone Available Help at Discharge:  (Family (daughters) are available, but not at all times ) Type of Home: Apartment Home Access: Level entry Home Layout: One level Bathroom Shower/Tub:  Curtain;Tub/shower  unit Bathroom Toilet: Handicapped height Bathroom Accessibility: Yes Home Adaptive Equipment: Grab bars in shower;Grab bars around toilet Additional Comments: Pt was living with her daughter (present today) since she had an HD AVF inserted last Thursday.  Prior to last Thursday the pt was living alone in a handicap ready apartment.  Prior Function Level of Independence: Independent Able to Take Stairs?: Yes Driving: No Comments: [facility]es to [facility] facility everyday when able- hardly miss a day; play cards etc.   Communication Communication: No difficulties    Cognition  Overall Cognitive Status: Appears within functional limits for tasks assessed/performed Arousal/Alertness: Awake/alert Orientation Level: Disoriented to;Time Behavior During Session: [facility] for tasks performed    Extremity/Trunk Assessment Right Upper Extremity Assessment RUE ROM/Strength/Tone: [facility] for tasks assessed Left Upper Extremity Assessment LUE ROM/Strength/Tone: Deficits (due to recent surgical procedure (AVF)) LUE ROM/Strength/Tone Deficits: good grip strength and full AROM at hand and elbow Right Lower Extremity Assessment RLE ROM/Strength/Tone: [facility] for tasks assessed (grossly 4-/5) Left Lower Extremity Assessment LLE ROM/Strength/Tone: [facility] for tasks assessed (grossly 4-/5) Trunk Assessment Trunk Assessment: Normal (General coordination decreased: difficulty don/doff diaper)   Balance Balance Balance Assessed: No  End of Session PT - End of Session Equipment Utilized During Treatment: Gait belt Activity Tolerance: Patient limited by fatigue Patient left: in chair;with call bell/phone within reach;with family/visitor present Nurse Communication: Mobility status       Sharion Balloon 11/21/2011, 1:28 PM  Sharion Balloon, SPT Acute Rehab Services [phone]

## 2011-11-21 NOTE — Clinical Social Work Placement (Addendum)
Clinical Social Work Department CLINICAL SOCIAL WORK PLACEMENT NOTE 11/21/2011  Patient:  Cheryl [name], Cheryl [name]  Account Number:  [id number] Admit date:  11/20/2011  Clinical Social Worker:  Delphine Sizemore Lubertha Basque  Date/time:  11/21/2011 03:50 PM  Clinical Social Work is seeking post-discharge placement for this patient at the following level of care:   SKILLED NURSING   (*CSW will update this form in Epic as items are completed)   11/21/2011  Patient/family provided with [facility] Department of Clinical Social Work's list of facilities offering this level of care within the geographic area requested by the patient (or if unable, by the patient's family).  11/21/2011  Patient/family informed of their freedom to choose among providers that offer the needed level of care, that participate in Medicare, Medicaid or managed care program needed by the patient, have an available bed and are willing to accept the patient.  11/21/2011  Patient/family informed of [facility]' ownership interest in [facility], as well as of the fact that they are under no obligation to receive care at this facility.  PASARR submitted to EDS on 11/21/2011 PASARR number received from EDS on 11/21/2011  FL2 transmitted to all facilities in geographic area requested by pt/family on  11/21/2011 FL2 transmitted to all facilities within larger geographic area on   Patient informed that his/her managed care company has contracts with or will negotiate with  certain facilities, including the following:     Patient/family informed of bed offers received:  11/24/11  Patient chooses bed at [facility] Physician recommends and patient chooses bed at    Patient to be transferred to  on  11/24/11 Patient to be transferred to facility by [facility]  The following physician request were entered in Epic:   Additional Comments:  Sabino Niemann, MSW, LCSWA [phone]  Patient will not be able to transfer to SNF over the  weekend.

## 2011-11-21 NOTE — Progress Notes (Signed)
[facility] PROGRESS NOTE  Cheryl [name] WUJ:[id number] DOB: 1925/05/17 DOA: 11/20/2011 PCP: Abigail Miyamoto, MD  Assessment/Plan: 1. Hyponatremia- seems to be due to volume depletion, responding to IVF.   2. Constipation- lactulose PRN 3. CKD- had vascular procedure done recently- area with much bruising but no infection, duplex showed no DVT- patient to follow up with dr. Hart [place], appreciate vascular help 4. Anemia of CD- transfuse 1 unit and monitor 5. N/V- resolved 6. H/o a fib- on coumadin 7. HTN- initally hypotensive, lasix held 8. Hypothyroid- continue synthroid  Called daughter who patient has been staying with- plan to d/c back home with 24 hour care by daughter  Code Status: full Family Communication: called daughter Disposition Plan: home with daughter when na better   Consultants:  vascular   HPI/Subjective: C/o constipation No fever, no chills  Objective: Filed Vitals:   11/20/11 1500 11/20/11 2026 11/21/11 0135 11/21/11 0540  BP: 105/44 125/62 124/67 120/60  Pulse: 66 77 76 63  Temp: 97.7 F (36.5 C) 98.1 F (36.7 C)  98.2 F (36.8 C)  TempSrc: Oral Oral  Oral  Resp: 18 18 18 18   Height:      Weight:      SpO2: 100% 100% 96% 100%    Intake/Output Summary (Last 24 hours) at 11/21/11 0954 Last data filed at 11/21/11 0831  Gross per 24 hour  Intake    920 ml  Output   1075 ml  Net   -155 ml   Filed Weights   11/20/11 0531  Weight: 69.083 kg (152 lb 4.8 oz)    Exam:   General:  Pleasant/cooperative  Cardiovascular: rrr  Respiratory: clear anterior  Abdomen: +BS, mild distention  Skin: area of bruising around incision site  Data Reviewed: Basic Metabolic Panel:  Lab 11/21/11 9562 11/20/11 1706 11/20/11 1238 11/20/11 0754 11/20/11 0221 11/20/11 0210  NA 125* 123* 120* 121* 120* --  K 4.5 4.8 5.0 4.5 4.6 --  CL 89* 86* 83* 84* 84* --  CO2 28 28 27 27  -- 28  GLUCOSE 92 115* 98 102* 116* --  BUN 46* 48* 45* 45* 46*  --  CREATININE 2.60* 2.76* 2.29* 2.23* 2.40* --  CALCIUM 8.7 8.5 8.7 8.7 -- 8.9  MG -- -- -- -- -- --  PHOS -- -- -- -- -- --   Liver Function Tests:  Lab 11/20/11 0210  AST 51*  ALT 15  ALKPHOS 99  BILITOT 0.5  PROT 7.1  ALBUMIN 2.8*   No results found for this basename: LIPASE:5,AMYLASE:5 in the last 168 hours No results found for this basename: AMMONIA:5 in the last 168 hours CBC:  Lab 11/21/11 0605 11/20/11 0754 11/20/11 0221 11/20/11 0210  WBC 6.8 6.6 -- 7.5  NEUTROABS -- -- -- 5.6  HGB 7.0* 7.1* 7.5* 7.5*  HCT 20.0* 20.1* 22.0* 21.1*  MCV 98.5 95.3 -- 95.5  PLT 299 281 -- 269   Cardiac Enzymes:  Lab 11/20/11 0754  CKTOTAL --  CKMB --  CKMBINDEX --  TROPONINI <0.30   BNP (last 3 results) No results found for this basename: PROBNP:3 in the last 8760 hours CBG: No results found for this basename: GLUCAP:5 in the last 168 hours  No results found for this or any previous visit (from the past 240 hour(s)).   Studies: Dg Chest Port 1 View  11/20/2011  *RADIOLOGY REPORT*  Clinical Data: Cough.  Weakness.  PORTABLE CHEST - 1 VIEW  Comparison: PA and lateral chest 11/13/2011.  Findings: There is cardiomegaly but no pulmonary edema.  Lung volumes are low with some subsegmental atelectasis in the bases. No pneumothorax or pleural fluid.  IMPRESSION: Cardiomegaly without acute disease.   Original Report Authenticated By: Bernadene Bell. D'ALESSIO, M.D.     Scheduled Meds:   . sodium chloride   Intravenous STAT  . antiseptic oral rinse  15 mL Mouth Rinse BID  . levothyroxine  75 mcg Oral QAC breakfast  . metoprolol  100 mg Oral BID  . oxybutynin  10 mg Oral Daily  . pantoprazole  40 mg Oral Daily  . polyethylene glycol  17 g Oral Daily  . sodium chloride  3 mL Intravenous Q12H  . warfarin  4 mg Oral ONCE-1800  . Warfarin - Pharmacist Dosing Inpatient   Does not apply q1800   Continuous Infusions:   . sodium chloride    . sodium chloride      Principal Problem:   *Generalized weakness Active Problems:  Atrial fibrillation  Hyponatremia  Anemia    Time spent: 25    Marlin Canary  [facility] Pager [phone] 11/21/2011, 9:54 AM  LOS: 1 day

## 2011-11-21 NOTE — Evaluation (Signed)
Cheryl [name],PT Acute Rehabilitation [phone] (pager)  

## 2011-11-21 NOTE — Clinical Social Work Psych Assess (Addendum)
Clinical Social Work Department BRIEF PSYCHOSOCIAL ASSESSMENT 11/21/2011  Patient:  Cheryl [name], Cheryl [name]     Account Number:  [id number]     Admit date:  11/20/2011  Clinical Social Worker:  Juliette Mangle  Date/Time:  11/21/2011 03:45 PM  Referred by:  RN  Date Referred:  11/21/2011 Referred for  SNF Placement   Other Referral:   Interview type:  Family Other interview type:   Patient was hard of hearing and was unable to particpate in the discussion    PSYCHOSOCIAL DATA Living Status:  FAMILY Admitted from facility:   Level of care:   Primary support name:  Lewis Moccasin Primary support relationship to patient:  CHILD, ADULT Degree of support available:   Patient's two daughters take care of the patient and have been at the patient's bedside since admission- support is stong    CURRENT CONCERNS Current Concerns  Post-Acute Placement   Other Concerns:    SOCIAL WORK ASSESSMENT / PLAN Clinical Social Worker received referral for the potential need for post acute placement at a SNF. CSW reviewed chart and met with patient and patient's daughters at bedside. CSW introduced self, explained role, and provided emotional support. CSW discussed skilled nursing home placement, reviewed the process of placement and answered all the patient's daughters' questions. After reviewing the SNF list, Patient and patietn's daughter were agreeable to CSW seeking SNF placement. CSW encouraged patient's family  to ask questions as needed of CSW and medical staff. CSW will begin the placement process and will continue to follow and assist with all d/c planning.   Assessment/plan status:  Psychosocial Support/Ongoing Assessment of Needs Other assessment/ plan:   Information/referral to community resources:   SNF choice list    PATIENT'S/FAMILY'S RESPONSE TO PLAN OF CARE: Patient's daughters were  very appreciative of support and information provided by CSW. CSW will continue to follow and  will assist with all d/c planning needs     Sabino Niemann, MSW, LCSWA [phone]

## 2011-11-22 LAB — TYPE AND SCREEN: Unit division: 0

## 2011-11-22 LAB — BASIC METABOLIC PANEL
BUN: 44 mg/dL — ABNORMAL HIGH (ref 6–23)
CO2: 24 mEq/L (ref 19–32)
Chloride: 95 mEq/L — ABNORMAL LOW (ref 96–112)
Creatinine, Ser: 2.27 mg/dL — ABNORMAL HIGH (ref 0.50–1.10)
GFR calc Af Amer: 22 mL/min — ABNORMAL LOW (ref >90)
Glucose, Bld: 104 mg/dL — ABNORMAL HIGH (ref 70–99)
Potassium: 4.6 mEq/L (ref 3.5–5.1)

## 2011-11-22 LAB — CBC
HCT: 23.5 % — ABNORMAL LOW (ref 36.0–46.0)
Hemoglobin: 8.1 g/dL — ABNORMAL LOW (ref 12.0–15.0)
MCV: 97.9 fL (ref 78.0–100.0)
RDW: 15.6 % — ABNORMAL HIGH (ref 11.5–15.5)
WBC: 7.1 10*3/uL (ref 4.0–10.5)

## 2011-11-22 MED ORDER — FLEET ENEMA 7-19 GM/118ML RE ENEM
1.0000 | ENEMA | Freq: Every day | RECTAL | Status: DC | PRN
  Filled 2011-11-22: qty 1

## 2011-11-22 MED ORDER — BENZONATATE 100 MG PO CAPS
100.0000 mg | ORAL_CAPSULE | Freq: Two times a day (BID) | ORAL | Status: DC | PRN
  Administered 2011-11-22: 100 mg via ORAL
  Filled 2011-11-22: qty 1

## 2011-11-22 MED ORDER — WARFARIN SODIUM 4 MG PO TABS
4.0000 mg | ORAL_TABLET | Freq: Once | ORAL | Status: AC
  Administered 2011-11-22: 4 mg via ORAL
  Filled 2011-11-22: qty 1

## 2011-11-22 MED ORDER — GLYCERIN (LAXATIVE) 2.1 G RE SUPP
1.0000 | Freq: Every day | RECTAL | Status: DC | PRN
  Administered 2011-11-22: 1 via RECTAL
  Filled 2011-11-22: qty 1

## 2011-11-22 MED ORDER — LORAZEPAM 0.5 MG PO TABS
0.5000 mg | ORAL_TABLET | Freq: Once | ORAL | Status: AC
  Administered 2011-11-22: 0.5 mg via ORAL
  Filled 2011-11-22: qty 1

## 2011-11-22 MED ORDER — LORAZEPAM BOLUS VIA INFUSION: 0.5000 mg | Freq: Once | INTRAVENOUS | Status: DC

## 2011-11-22 NOTE — Progress Notes (Signed)
[facility] PROGRESS NOTE  Cheryl [name] ZOX:[id number] DOB: [birth year] DOA: 11/20/2011 PCP: Abigail Miyamoto, MD  Assessment/Plan: 1. Hyponatremia- seems to be due to volume depletion, responding to IVF.   2. Constipation- lactulose PRN, add suppository and enema if not better 3. CKD- had vascular procedure done recently- area with much bruising but no infection, duplex showed no DVT- patient to follow up with dr. Hart [place], appreciate vascular help 4. Anemia of CD- transfuse 1 unit and monitor 5. N/V- resolved 6. H/o a fib- on coumadin 7. HTN- initally hypotensive, lasix held 8. Hypothyroid- continue synthroid   Code Status: full Family Communication: called daughter Disposition Plan: SNF?   Consultants:  vascular   HPI/Subjective: C/o constipation still, no BM per patient No fever, no chills  Objective: Filed Vitals:   11/21/11 1654 11/21/11 1759 11/21/11 1956 11/22/11 0641  BP: 127/47 137/49 131/76 130/83  Pulse: 78 77 110 79  Temp: 98.4 F (36.9 C) 98.3 F (36.8 C) 98.7 F (37.1 C) 98.4 F (36.9 C)  TempSrc: Oral Oral Oral Oral  Resp:  18 19 20   Height:      Weight:    69 kg (152 lb 1.9 oz)  SpO2: 97% 98% 95% 96%    Intake/Output Summary (Last 24 hours) at 11/22/11 1055 Last data filed at 11/22/11 0807  Gross per 24 hour  Intake 2413.75 ml  Output   1000 ml  Net 1413.75 ml   Filed Weights   11/20/11 0531 11/22/11 0641  Weight: 69.083 kg (152 lb 4.8 oz) 69 kg (152 lb 1.9 oz)    Exam:   General:  Pleasant/cooperative  Cardiovascular: rrr  Respiratory: clear anterior  Abdomen: +BS, mild distention  Skin: area of bruising around incision site  Data Reviewed: Basic Metabolic Panel:  Lab 11/22/11 8119 11/21/11 0605 11/20/11 1706 11/20/11 1238 11/20/11 0754  NA 129* 125* 123* 120* 121*  K 4.6 4.5 4.8 5.0 4.5  CL 95* 89* 86* 83* 84*  CO2 24 28 28 27 27   GLUCOSE 104* 92 115* 98 102*  BUN 44* 46* 48* 45* 45*  CREATININE 2.27*  2.60* 2.76* 2.29* 2.23*  CALCIUM 8.4 8.7 8.5 8.7 8.7  MG -- -- -- -- --  PHOS -- -- -- -- --   Liver Function Tests:  Lab 11/20/11 0210  AST 51*  ALT 15  ALKPHOS 99  BILITOT 0.5  PROT 7.1  ALBUMIN 2.8*   No results found for this basename: LIPASE:5,AMYLASE:5 in the last 168 hours No results found for this basename: AMMONIA:5 in the last 168 hours CBC:  Lab 11/22/11 0545 11/21/11 0605 11/20/11 0754 11/20/11 0221 11/20/11 0210  WBC 7.1 6.8 6.6 -- 7.5  NEUTROABS -- -- -- -- 5.6  HGB 8.1* 7.0* 7.1* 7.5* 7.5*  HCT 23.5* 20.0* 20.1* 22.0* 21.1*  MCV 97.9 98.5 95.3 -- 95.5  PLT 279 299 281 -- 269   Cardiac Enzymes:  Lab 11/20/11 0754  CKTOTAL --  CKMB --  CKMBINDEX --  TROPONINI <0.30   BNP (last 3 results) No results found for this basename: PROBNP:3 in the last 8760 hours CBG: No results found for this basename: GLUCAP:5 in the last 168 hours  No results found for this or any previous visit (from the past 240 hour(s)).   Studies: No results found.  Scheduled Meds:    . antiseptic oral rinse  15 mL Mouth Rinse BID  . levothyroxine  75 mcg Oral QAC breakfast  . metoprolol  50 mg Oral  BID  . oxybutynin  10 mg Oral Daily  . pantoprazole  40 mg Oral Daily  . polyethylene glycol  17 g Oral Daily  . sodium chloride  3 mL Intravenous Q12H  . warfarin  4 mg Oral ONCE-1800  . Warfarin - Pharmacist Dosing Inpatient   Does not apply q1800  . DISCONTD: metoprolol  100 mg Oral BID   Continuous Infusions:    . sodium chloride 75 mL/hr at 11/22/11 0429    Principal Problem:  *Generalized weakness Active Problems:  Atrial fibrillation  Hyponatremia  Anemia    Time spent: 25    Marlin Canary  [facility] Pager [phone] 11/22/2011, 10:55 AM  LOS: 2 days

## 2011-11-22 NOTE — Progress Notes (Signed)
ANTICOAGULATION CONSULT NOTE - Follow Up Consult  Pharmacy Consult for Coumadin Indication: History of PE and Atrial Fibrillation  Allergies  Allergen Reactions  . Codeine Other (See Comments)    unknown  . Sulfonamide Derivatives Other (See Comments)    unknown    Patient Measurements: Height: 5\' 2"  (157.5 cm) Weight: 152 lb 1.9 oz (69 kg) IBW/kg (Calculated) : 50.1   Vital Signs: Temp: 98.4 F (36.9 C) (11/02 0641) Temp src: Oral (11/02 0641) BP: 130/83 mmHg (11/02 0641) Pulse Rate: 79  (11/02 0641)  Labs:  Basename 11/22/11 0545 11/21/11 0605 11/20/11 1706 11/20/11 0754 11/20/11 0210  HGB 8.1* 7.0* -- -- --  HCT 23.5* 20.0* -- 20.1* --  PLT 279 299 -- 281 --  APTT -- -- -- -- 58*  LABPROT 26.4* 24.1* -- -- 23.1*  INR 2.58* 2.28* -- -- 2.15*  HEPARINUNFRC -- -- -- -- --  CREATININE 2.27* 2.60* 2.76* -- --  CKTOTAL -- -- -- -- --  CKMB -- -- -- -- --  TROPONINI -- -- -- <0.30 --    Estimated Creatinine Clearance: 16.8 ml/min (by C-G formula based on Cr of 2.27).   Medications:  Scheduled:     . antiseptic oral rinse  15 mL Mouth Rinse BID  . levothyroxine  75 mcg Oral QAC breakfast  . metoprolol  50 mg Oral BID  . oxybutynin  10 mg Oral Daily  . pantoprazole  40 mg Oral Daily  . polyethylene glycol  17 g Oral Daily  . sodium chloride  3 mL Intravenous Q12H  . warfarin  4 mg Oral ONCE-1800  . Warfarin - Pharmacist Dosing Inpatient   Does not apply q1800  . DISCONTD: metoprolol  100 mg Oral BID    Assessment: 6 YOF with history of pulmonary embolism and atrial fibrillation on chronic coumadin PTA admitted with weakness and low hemoglobin in setting of anemia. Home coumadin regimen was 4 mg daily and patient was therapeutic on admission. INR today remains therapeutic at 2.58. Hemoglobin up at 8.1 s/p transfusion on 11/1 per primary team for anemia. No bleeding reported, however much bruising is noted at site of recent vascular procedure. Duplex on 10/31 was  negative for DVT. Platelets are stable.   Goal of Therapy:  INR 2-3   Plan:  1. Coumadin 4 mg po x1 again tonight per home regimen. 2. Continue to monitor PT/INR- will consider scheduling home dose and MWF INR's if patient remains stable and hemoglobin improves.   Link Snuffer, PharmD, BCPS Clinical Pharmacist [phone] 11/22/2011,12:43 PM

## 2011-11-23 DIAGNOSIS — I10 Essential (primary) hypertension: Secondary | ICD-10-CM

## 2011-11-23 LAB — BASIC METABOLIC PANEL
CO2: 23 mEq/L (ref 19–32)
Calcium: 8.3 mg/dL — ABNORMAL LOW (ref 8.4–10.5)
Chloride: 99 mEq/L (ref 96–112)
Creatinine, Ser: 1.97 mg/dL — ABNORMAL HIGH (ref 0.50–1.10)
Glucose, Bld: 101 mg/dL — ABNORMAL HIGH (ref 70–99)

## 2011-11-23 LAB — CBC
HCT: 22.6 % — ABNORMAL LOW (ref 36.0–46.0)
Hemoglobin: 7.7 g/dL — ABNORMAL LOW (ref 12.0–15.0)
MCH: 33.3 pg (ref 26.0–34.0)
MCV: 97.8 fL (ref 78.0–100.0)
RBC: 2.31 MIL/uL — ABNORMAL LOW (ref 3.87–5.11)
WBC: 7.2 10*3/uL (ref 4.0–10.5)

## 2011-11-23 MED ORDER — WARFARIN SODIUM 3 MG PO TABS
3.0000 mg | ORAL_TABLET | Freq: Once | ORAL | Status: DC
  Filled 2011-11-23: qty 1

## 2011-11-23 MED ORDER — WARFARIN SODIUM 2.5 MG PO TABS
2.5000 mg | ORAL_TABLET | Freq: Once | ORAL | Status: AC
  Administered 2011-11-23: 2.5 mg via ORAL
  Filled 2011-11-23: qty 1

## 2011-11-23 NOTE — Progress Notes (Addendum)
ANTICOAGULATION CONSULT NOTE - Follow Up Consult  Pharmacy Consult for Coumadin Indication: History of PE and Atrial Fibrillation  Allergies  Allergen Reactions  . Codeine Other (See Comments)    unknown  . Sulfonamide Derivatives Other (See Comments)    unknown    Patient Measurements: Height: 5\' 2"  (157.5 cm) Weight: 152 lb 8.9 oz (69.2 kg) IBW/kg (Calculated) : 50.1   Vital Signs: Temp: 98.9 F (37.2 C) (11/03 0600) Temp src: Oral (11/03 0600) BP: 144/76 mmHg (11/03 1104) Pulse Rate: 82  (11/03 1104)  Labs:  Basename 11/23/11 0620 11/22/11 0545 11/21/11 0605  HGB 7.7* 8.1* --  HCT 22.6* 23.5* 20.0*  PLT 299 279 299  APTT -- -- --  LABPROT 29.1* 26.4* 24.1*  INR 2.94* 2.58* 2.28*  HEPARINUNFRC -- -- --  CREATININE 1.97* 2.27* 2.60*  CKTOTAL -- -- --  CKMB -- -- --  TROPONINI -- -- --    Estimated Creatinine Clearance: 19.4 ml/min (by C-G formula based on Cr of 1.97).   Medications:  Scheduled:     . antiseptic oral rinse  15 mL Mouth Rinse BID  . levothyroxine  75 mcg Oral QAC breakfast  . [COMPLETED] LORazepam  0.5 mg Oral Once  . metoprolol  50 mg Oral BID  . oxybutynin  10 mg Oral Daily  . pantoprazole  40 mg Oral Daily  . polyethylene glycol  17 g Oral Daily  . sodium chloride  3 mL Intravenous Q12H  . [COMPLETED] warfarin  4 mg Oral ONCE-1800  . Warfarin - Pharmacist Dosing Inpatient   Does not apply q1800  . [DISCONTINUED] LORazepam  0.5 mg Intravenous Once    Assessment: 28 YOF with history of pulmonary embolism and atrial fibrillation on chronic coumadin PTA admitted with weakness and low hemoglobin in setting of anemia. Home coumadin regimen was 4 mg daily and patient was therapeutic on admission. INR today remains therapeutic at 2.94- trending up on home dose- may be secondary to CHF. Hemoglobin back down at 7.7( s/p transfusion on 11/1 per primary team for anemia). No bleeding reported, however much bruising is noted at site of recent  vascular procedure. Duplex on 10/31 was negative for DVT. Platelets are stable.   Visualized bruising - healing well however arm is swollen. Patient currently receiving fluids.   Goal of Therapy:  INR 2-3   Plan:  1. Will give reduced dose of Coumadin 2.5mg  po x1 tonight 2/2 to rising INR on home dose.  2. Continue to monitor PT/INR.  Link Snuffer, PharmD, BCPS Clinical Pharmacist [phone] 11/23/2011,11:17 AM

## 2011-11-23 NOTE — Progress Notes (Signed)
[facility] PROGRESS NOTE  Cheryl [name] ZOX:[id number] DOB: [birth year] DOA: 11/20/2011 PCP: Abigail Miyamoto, MD  Assessment/Plan: 1. Hyponatremia- seems to be due to volume depletion, responding to IVF will d/c 2. Constipation- lactulose PRN, 2 BM yesterday 3. CKD- had vascular procedure done recently- area with much bruising but no infection, duplex showed no DVT- patient to follow up with dr. Hart [place], appreciate vascular help 4. Anemia of CD- transfused 1 unit and monitor 5. N/V- resolved 6. H/o a fib- on coumadin 7. HTN- initally hypotensive, lasix held 8. Hypothyroid- continue synthroid   Code Status: full Family Communication: called daughter Disposition Plan: SNF?   Consultants:  vascular   HPI/Subjective: Feeling better No SOB, no fever  Objective: Filed Vitals:   11/22/11 1342 11/22/11 2047 11/23/11 0543 11/23/11 0600  BP: 121/45 132/81  130/72  Pulse: 84 72  74  Temp: 98.5 F (36.9 C) 98.5 F (36.9 C)  98.9 F (37.2 C)  TempSrc: Oral Oral  Oral  Resp: 16 18  18   Height:      Weight:    69.2 kg (152 lb 8.9 oz)  SpO2: 100% 98% 95% 96%    Intake/Output Summary (Last 24 hours) at 11/23/11 0925 Last data filed at 11/23/11 0603  Gross per 24 hour  Intake 2551.75 ml  Output    851 ml  Net 1700.75 ml   Filed Weights   11/20/11 0531 11/22/11 0641 11/23/11 0600  Weight: 69.083 kg (152 lb 4.8 oz) 69 kg (152 lb 1.9 oz) 69.2 kg (152 lb 8.9 oz)    Exam:   General:  Pleasant/cooperative  Cardiovascular: rrr  Respiratory: clear anterior  Abdomen: +BS, mild distention  Skin: area of bruising around incision site  Data Reviewed: Basic Metabolic Panel:  Lab 11/23/11 8119 11/22/11 0545 11/21/11 0605 11/20/11 1706 11/20/11 1238  NA 131* 129* 125* 123* 120*  K 4.6 4.6 4.5 4.8 5.0  CL 99 95* 89* 86* 83*  CO2 23 24 28 28 27   GLUCOSE 101* 104* 92 115* 98  BUN 38* 44* 46* 48* 45*  CREATININE 1.97* 2.27* 2.60* 2.76* 2.29*  CALCIUM 8.3*  8.4 8.7 8.5 8.7  MG -- -- -- -- --  PHOS -- -- -- -- --   Liver Function Tests:  Lab 11/20/11 0210  AST 51*  ALT 15  ALKPHOS 99  BILITOT 0.5  PROT 7.1  ALBUMIN 2.8*   No results found for this basename: LIPASE:5,AMYLASE:5 in the last 168 hours No results found for this basename: AMMONIA:5 in the last 168 hours CBC:  Lab 11/23/11 0620 11/22/11 0545 11/21/11 0605 11/20/11 0754 11/20/11 0221 11/20/11 0210  WBC 7.2 7.1 6.8 6.6 -- 7.5  NEUTROABS -- -- -- -- -- 5.6  HGB 7.7* 8.1* 7.0* 7.1* 7.5* --  HCT 22.6* 23.5* 20.0* 20.1* 22.0* --  MCV 97.8 97.9 98.5 95.3 -- 95.5  PLT 299 279 299 281 -- 269   Cardiac Enzymes:  Lab 11/20/11 0754  CKTOTAL --  CKMB --  CKMBINDEX --  TROPONINI <0.30   BNP (last 3 results) No results found for this basename: PROBNP:3 in the last 8760 hours CBG: No results found for this basename: GLUCAP:5 in the last 168 hours  No results found for this or any previous visit (from the past 240 hour(s)).   Studies: No results found.  Scheduled Meds:    . antiseptic oral rinse  15 mL Mouth Rinse BID  . levothyroxine  75 mcg Oral QAC breakfast  . [  COMPLETED] LORazepam  0.5 mg Oral Once  . metoprolol  50 mg Oral BID  . oxybutynin  10 mg Oral Daily  . pantoprazole  40 mg Oral Daily  . polyethylene glycol  17 g Oral Daily  . sodium chloride  3 mL Intravenous Q12H  . [COMPLETED] warfarin  4 mg Oral ONCE-1800  . Warfarin - Pharmacist Dosing Inpatient   Does not apply q1800  . [DISCONTINUED] LORazepam  0.5 mg Intravenous Once   Continuous Infusions:    . [DISCONTINUED] sodium chloride 75 mL/hr at 11/23/11 0555    Principal Problem:  *Generalized weakness Active Problems:  Atrial fibrillation  Hyponatremia  Anemia    Time spent: 25    Marlin Canary  [facility] Pager [phone] 11/23/2011, 9:25 AM  LOS: 3 days

## 2011-11-24 LAB — BASIC METABOLIC PANEL
Chloride: 99 mEq/L (ref 96–112)
GFR calc Af Amer: 25 mL/min — ABNORMAL LOW (ref >90)
Potassium: 4.4 mEq/L (ref 3.5–5.1)

## 2011-11-24 LAB — PROTIME-INR
INR: 2.96 — ABNORMAL HIGH (ref 0.00–1.49)
Prothrombin Time: 29.3 seconds — ABNORMAL HIGH (ref 11.6–15.2)

## 2011-11-24 MED ORDER — FUROSEMIDE 20 MG PO TABS: 20.0000 mg | ORAL_TABLET | Freq: Every day | ORAL | Status: DC

## 2011-11-24 MED ORDER — WARFARIN SODIUM 2.5 MG PO TABS: 2.5000 mg | ORAL_TABLET | Freq: Every day | ORAL | Status: DC

## 2011-11-24 MED ORDER — METOPROLOL TARTRATE 50 MG PO TABS: 50.0000 mg | ORAL_TABLET | Freq: Two times a day (BID) | ORAL | Status: DC

## 2011-11-24 MED ORDER — BENZONATATE 100 MG PO CAPS: 100.0000 mg | ORAL_CAPSULE | Freq: Two times a day (BID) | ORAL | Status: DC | PRN

## 2011-11-24 MED ORDER — WARFARIN - PHARMACIST DOSING INPATIENT: Freq: Every day | Status: DC

## 2011-11-24 MED ORDER — FUROSEMIDE 20 MG PO TABS
20.0000 mg | ORAL_TABLET | Freq: Every day | ORAL | Status: DC
  Administered 2011-11-24: 20 mg via ORAL
  Filled 2011-11-24 (×2): qty 1

## 2011-11-24 MED ORDER — WARFARIN SODIUM 2.5 MG PO TABS
2.5000 mg | ORAL_TABLET | Freq: Once | ORAL | Status: DC
Start: 2011-11-24 — End: 2011-11-24
  Filled 2011-11-24: qty 1

## 2011-11-24 MED ORDER — FUROSEMIDE 20 MG PO TABS
20.0000 mg | ORAL_TABLET | Freq: Once | ORAL | Status: AC
  Administered 2011-11-24: 20 mg via ORAL
  Filled 2011-11-24: qty 1

## 2011-11-24 MED ORDER — TRAMADOL HCL 50 MG PO TABS: 25.0000 mg | ORAL_TABLET | Freq: Four times a day (QID) | ORAL | Status: DC | PRN

## 2011-11-24 NOTE — Progress Notes (Signed)
[facility] PROGRESS NOTE  Cheryl [name] ZOX:[id number] DOB: [birth year] DOA: 11/20/2011 PCP: Abigail Miyamoto, MD  Assessment/Plan: 1. Hyponatremia- seems to be due to volume depletion, responding to IVF will d/c. BMET this am pending. Pt received lasix during night for sob/crackles in bases of lungs on auscultation. Have been holding home lasix.  2. Constipation- lactulose PRN, 2 BM 11/22/11 3. CKD- had vascular procedure done recently- area with much bruising but no infection, duplex showed no DVT- patient to follow up with dr. Hart [place], appreciate vascular help. Bmet pending this am.  4. Anemia of CD- transfused 1 unit and monitor. stable 5. N/V- resolved 6. H/o a fib- on coumadin 7. HTN- initally hypotensive, lasix held. SBP range 130-155. DBP range 57-86. Will resume home lasix at lower dose i.e. 20mg  daily vs 20mg  BID.  8. Hypothyroid- continue synthroid    Code Status: full Family Communication:  Disposition Plan: ?SNF. Likely ready this afternoon   Consultants:  vascular  Procedures:  none  Antibiotics:     HPI/Subjective: Up in recliner eyes closed. Arouses to verbal stimuli. Reports breathing improved. Complains of left arm/hand swelling but reports not as sore.   Objective: Filed Vitals:   11/23/11 1104 11/23/11 2219 11/24/11 0214 11/24/11 0411  BP: 144/76 149/86 151/66 155/57  Pulse: 82 72 88 85  Temp:  98.4 F (36.9 C)  98.3 F (36.8 C)  TempSrc:  Oral  Oral  Resp:  18  18  Height:      Weight:    71.8 kg (158 lb 4.6 oz)  SpO2:  100% 98% 95%    Intake/Output Summary (Last 24 hours) at 11/24/11 0723 Last data filed at 11/24/11 8119  Gross per 24 hour  Intake 1789.25 ml  Output   1125 ml  Net 664.25 ml   Filed Weights   11/22/11 0641 11/23/11 0600 11/24/11 0411  Weight: 69 kg (152 lb 1.9 oz) 69.2 kg (152 lb 8.9 oz) 71.8 kg (158 lb 4.6 oz)    Exam:   General:  Awake, alert NAD  Cardiovascular: RRR trace LEE  Respiratory:  normal effort but somewhat distant. No rhonchi. No crackles  Abdomen: soft +BS non-tender  Extremities : left arm with incision clean and dry. No erythema. Swelling from site to hand. Hand very puffy. Warm radial pulse palpable  Data Reviewed: Basic Metabolic Panel:  Lab 11/23/11 1478 11/22/11 0545 11/21/11 0605 11/20/11 1706 11/20/11 1238  NA 131* 129* 125* 123* 120*  K 4.6 4.6 4.5 4.8 5.0  CL 99 95* 89* 86* 83*  CO2 23 24 28 28 27   GLUCOSE 101* 104* 92 115* 98  BUN 38* 44* 46* 48* 45*  CREATININE 1.97* 2.27* 2.60* 2.76* 2.29*  CALCIUM 8.3* 8.4 8.7 8.5 8.7  MG -- -- -- -- --  PHOS -- -- -- -- --   Liver Function Tests:  Lab 11/20/11 0210  AST 51*  ALT 15  ALKPHOS 99  BILITOT 0.5  PROT 7.1  ALBUMIN 2.8*   No results found for this basename: LIPASE:5,AMYLASE:5 in the last 168 hours No results found for this basename: AMMONIA:5 in the last 168 hours CBC:  Lab 11/23/11 0620 11/22/11 0545 11/21/11 0605 11/20/11 0754 11/20/11 0221 11/20/11 0210  WBC 7.2 7.1 6.8 6.6 -- 7.5  NEUTROABS -- -- -- -- -- 5.6  HGB 7.7* 8.1* 7.0* 7.1* 7.5* --  HCT 22.6* 23.5* 20.0* 20.1* 22.0* --  MCV 97.8 97.9 98.5 95.3 -- 95.5  PLT 299 279 299 281 --  269   Cardiac Enzymes:  Lab 11/20/11 0754  CKTOTAL --  CKMB --  CKMBINDEX --  TROPONINI <0.30   BNP (last 3 results) No results found for this basename: PROBNP:3 in the last 8760 hours CBG: No results found for this basename: GLUCAP:5 in the last 168 hours  No results found for this or any previous visit (from the past 240 hour(s)).   Studies: No results found.  Scheduled Meds:   . antiseptic oral rinse  15 mL Mouth Rinse BID  . [COMPLETED] furosemide  20 mg Oral Once  . levothyroxine  75 mcg Oral QAC breakfast  . metoprolol  50 mg Oral BID  . oxybutynin  10 mg Oral Daily  . pantoprazole  40 mg Oral Daily  . polyethylene glycol  17 g Oral Daily  . sodium chloride  3 mL Intravenous Q12H  . [COMPLETED] warfarin  2.5 mg Oral  ONCE-1800  . Warfarin - Pharmacist Dosing Inpatient   Does not apply q1800  . [DISCONTINUED] warfarin  3 mg Oral ONCE-1800   Continuous Infusions:   . [DISCONTINUED] sodium chloride Stopped (11/23/11 1216)    Principal Problem:  *Generalized weakness Active Problems:  Atrial fibrillation  Hyponatremia  Anemia    Time spent: 30 minutes    [facility] M NP [facility]  If 8PM-8AM, please contact night-coverage at [email or web address], password [facility] 11/24/2011, 7:23 AM  LOS: 4 days

## 2011-11-24 NOTE — Discharge Summary (Signed)
Physician Discharge Summary  Cheryl [name]:[id number] DOB: July 02, 1925 DOA: 11/20/2011  PCP: Abigail Miyamoto, MD  Admit date: 11/20/2011 Discharge date: 11/24/2011  Time spent: 45 minutes  Recommendations for Outpatient Follow-up:  1. Recommend follow up with PCP 1 week. Currently has follow up appointment with vascular MD. Recommend BMET on 11/28/11 to evaluate sodium and renal function. Will need INR check 11/26/11. Recommend daily BP for 14 days to evaluate BP control  Discharge Diagnoses:  Principal Problem:  *Generalized weakness Active Problems:  Atrial fibrillation  Hyponatremia  Anemia   Discharge Condition: Medically stable and ready for discharge to SNF  Diet recommendation: heart healthy  Filed Weights   11/22/11 0641 11/23/11 0600 11/24/11 0411  Weight: 69 kg (152 lb 1.9 oz) 69.2 kg (152 lb 8.9 oz) 71.8 kg (158 lb 4.6 oz)    History of present illness:  78 year-old female with history of chronic kidney disease stage 4-5, atrial fibrillation and history of PE on Coumadin, hypertension, pulmonary hypertension, anemia, history of temporal arteritis, who had undergone AV fistula placement in the left upper extremity one week prior to  Presentation  to the ED on 11/20/11 because of weakness. Patient stated that over the previos 2-3 days she had been feeling very weak to the point that she was not even able to walk up to the bathroom. Doing so made her feel dizzy. Denied any nausea vomiting or any abdominal pain or diarrhea denied any shortness of breath or chest pain. In the ER patient was found to be anemic more than her usual and hyponatremic. Initially patient was found to be mildly hypotensive. Patient's blood pressure improved with 250 cc normal saline bolus. At this time patient will be admitted for further management. Patient has been noticed to have swelling in the left arm where she had surgery but does not look infected. The AV fistula had good  thrill   Hospital Course:  1. Hyponatremia- likely related to volume depletion. Pt admitted to tele. Given 250 bolus in ED and gently hydrated. Sodium slowly trended up. On admission 123 and at discharge 134. Her home dose of lasix was held until 11/3 when  pt received lasix during night for sob/crackles in bases of lungs on auscultation. At discharge will resume home lasix at lower dose. Recommend BMET on 11/28/11 to evaluate sodium 2. SOB during the night of 11/24/11. Likely related to volume overload. Lasix given and diuresed. At discharge BSCTAB. Will discharge on home lasix at lower dose.   3. Constipation- lactulose PRN, 2 BM 11/22/11 4. CKD- had vascular procedure done recently- area with much bruising but no infection, duplex showed no DVT-  Patient evaluated by vascular. No infection. patient to follow up with dr. Hart [place], appreciate vascular help.  5. Anemia of CD- transfused 1 unit and monitor. Stable at discharge. No s/sx bleeding.  6. N/V- resolved 7. H/o a fib- on coumadin per pharmacy during hospitalization. On admission INR therapuetic on home dose of 4mg  daily. During hospitalization INR trended upward on home dose. Dose decreased to 2.4mg  po on 11/23/11. INR at discharge 2.9. Pharmacy recommends discharge dose of coumadin 2.5mg  with INR check 11/26/11. 8. HTN- initally hypotensive, lasix held. SBP range 130-155, DBP range 57-86 at discharge. Will resume home lasix at lower dose i.e. 20mg  daily vs 20mg  BID. Recommend daily BP monitoring for 14 days to evaluated BP control 9. Hypothyroid- continue synthroid. TSH 0.82 10/13.      Procedures:  none  Consultations:  vascular Discharge  Exam: Filed Vitals:   11/24/11 0214 11/24/11 0411 11/24/11 0909 11/24/11 1004  BP: 151/66 155/57  137/57  Pulse: 88 85 92 72  Temp:  98.3 F (36.8 C)  98.2 F (36.8 C)  TempSrc:  Oral  Oral  Resp:  18  20  Height:      Weight:  71.8 kg (158 lb 4.6 oz)    SpO2: 98% 95% 98% 98%    General:  awake alert NAD Cardiovascular: RRR trace LEE PPP Respiratory: normal effort No wheeze/rales  Discharge Instructions  Discharge Orders    Future Appointments: Provider: Department: Dept Phone: Center:   11/25/2011 3:00 PM [facility] [phone] None   12/23/2011 10:15 AM Pryor Ochoa, MD [facility] [phone] [facility]       Medication List     As of 11/24/2011 10:54 AM    ASK your doctor about these medications         amLODipine 5 MG tablet   Commonly known as: NORVASC   Take 5 mg by mouth daily.      enoxaparin 30 MG/0.3ML injection   Commonly known as: LOVENOX   Inject 30 mg into the skin Twice daily.      furosemide 20 MG tablet   Commonly known as: LASIX   Take 20 mg by mouth 2 (two) times daily.      levothyroxine 75 MCG tablet   Commonly known as: SYNTHROID, LEVOTHROID   Take 75 mcg by mouth daily.      Melatonin 5 MG Tabs   Take 1 tablet by mouth at bedtime.      metoprolol 100 MG tablet   Commonly known as: LOPRESSOR   Take 100 mg by mouth 2 (two) times daily.      mirtazapine 15 MG tablet   Commonly known as: REMERON   Take 15 mg by mouth at bedtime as needed. For sleep      multivitamin per tablet   Take 1 tablet by mouth daily.      omeprazole 20 MG capsule   Commonly known as: PRILOSEC   Take 20 mg by mouth daily.      oxybutynin 10 MG 24 hr tablet   Commonly known as: DITROPAN-XL   Take 10 mg by mouth daily.      oxyCODONE 5 MG immediate release tablet   Commonly known as: Oxy IR/ROXICODONE   Take 1 tablet (5 mg total) by mouth every 4 (four) hours as needed for pain.      polyethylene glycol powder powder   Commonly known as: GLYCOLAX/MIRALAX   Take 17 g by mouth daily as needed. For constipation      promethazine 12.5 MG tablet   Commonly known as: PHENERGAN   Take 1 tablet (12.5 mg total) by mouth every 4 (four) hours as needed for nausea.       Vitamin D 400 UNITS capsule   Take 800 Units by mouth daily.      warfarin 1 MG tablet   Commonly known as: COUMADIN   Take 1 mg by mouth daily. Takes 1 mg along with 3 mg for a total 4 mg daily      warfarin 3 MG tablet   Commonly known as: COUMADIN   Take 3 mg by mouth daily. Takes 3 mg along with a 1 mg tablet for total of 4 mg daily  The results of significant diagnostics from this hospitalization (including imaging, microbiology, ancillary and laboratory) are listed below for reference.    Significant Diagnostic Studies: Dg Chest 2 View  11/13/2011  *RADIOLOGY REPORT*  Clinical Data: Preoperative radiograph.  Arteriovenous graft in the left arm.  CHEST - 2 VIEW  Comparison: 12/25/2009.  Findings: Chronic bronchitic changes are present at the bases. Bilateral pleural apical scarring appears similar to prior. Emphysema with hyperinflation.  Compression fracture is present at the thoracolumbar junction , likely L2.  There is no airspace disease or effusion.  The cardiopericardial silhouette is borderline for projection.  Basilar scarring.  IMPRESSION: 1.  No acute cardiopulmonary disease. 2.  Emphysema.   Original Report Authenticated By: Andreas Newport, M.D.    Dg Chest Port 1 View  11/20/2011  *RADIOLOGY REPORT*  Clinical Data: Cough.  Weakness.  PORTABLE CHEST - 1 VIEW  Comparison: PA and lateral chest 11/13/2011.  Findings: There is cardiomegaly but no pulmonary edema.  Lung volumes are low with some subsegmental atelectasis in the bases. No pneumothorax or pleural fluid.  IMPRESSION: Cardiomegaly without acute disease.   Original Report Authenticated By: Bernadene Bell. Maricela Curet, M.D.     Microbiology: No results found for this or any previous visit (from the past 240 hour(s)).   Labs: Basic Metabolic Panel:  Lab 11/24/11 1610 11/23/11 0620 11/22/11 0545 11/21/11 0605 11/20/11 1706  NA 134* 131* 129* 125* 123*  K 4.4 4.6 4.6 4.5 4.8  CL 99 99 95* 89* 86*  CO2 25 23 24  28 28   GLUCOSE 140* 101* 104* 92 115*  BUN 34* 38* 44* 46* 48*  CREATININE 2.00* 1.97* 2.27* 2.60* 2.76*  CALCIUM 9.1 8.3* 8.4 8.7 8.5  MG -- -- -- -- --  PHOS -- -- -- -- --   Liver Function Tests:  Lab 11/20/11 0210  AST 51*  ALT 15  ALKPHOS 99  BILITOT 0.5  PROT 7.1  ALBUMIN 2.8*   No results found for this basename: LIPASE:5,AMYLASE:5 in the last 168 hours No results found for this basename: AMMONIA:5 in the last 168 hours CBC:  Lab 11/23/11 0620 11/22/11 0545 11/21/11 0605 11/20/11 0754 11/20/11 0221 11/20/11 0210  WBC 7.2 7.1 6.8 6.6 -- 7.5  NEUTROABS -- -- -- -- -- 5.6  HGB 7.7* 8.1* 7.0* 7.1* 7.5* --  HCT 22.6* 23.5* 20.0* 20.1* 22.0* --  MCV 97.8 97.9 98.5 95.3 -- 95.5  PLT 299 279 299 281 -- 269   Cardiac Enzymes:  Lab 11/20/11 0754  CKTOTAL --  CKMB --  CKMBINDEX --  TROPONINI <0.30   BNP: BNP (last 3 results) No results found for this basename: PROBNP:3 in the last 8760 hours CBG: No results found for this basename: GLUCAP:5 in the last 168 hours     Signed:  Gwenyth Bender  [facility] 11/24/2011, 10:54 AM

## 2011-11-24 NOTE — Progress Notes (Signed)
Physical Therapy Treatment Patient Details Name: Cheryl [name] MRN: [id number] DOB: 09-06-1925 Today's Date: 11/24/2011 Time: 7829-5621 PT Time Calculation (min): 25 min  PT Assessment / Plan / Recommendation Comments on Treatment Session  Pt admitted with weakness after AV fistula placed. Pt progressing with activity and encouraged to continue mobility with nursing during the day as well as HEP. Will continue to follow.     Follow Up Recommendations  Post acute inpatient     Does the patient have the potential to tolerate intense rehabilitation  No, Recommend SNF  Barriers to Discharge        Equipment Recommendations       Recommendations for Other Services    Frequency     Plan Discharge plan remains appropriate;Frequency remains appropriate    Precautions / Restrictions Precautions Precautions: Fall   Pertinent Vitals/Pain No pain HR 92, sats 98% on RA    Mobility  Bed Mobility Bed Mobility: Not assessed Transfers Transfers: Sit to Stand;Stand to Sit Sit to Stand: 5: Supervision;From chair/3-in-1;With armrests Stand to Sit: 5: Supervision;To chair/3-in-1;With armrests Stand Pivot Transfers: 5: Supervision;With armrests Details for Transfer Assistance: from recliner and BSC with cueing for hand placement and safety Ambulation/Gait Ambulation/Gait Assistance: 4: Min guard Ambulation Distance (Feet): 150 Feet Assistive device: Rolling walker Ambulation/Gait Assistance Details: cueing for posture, position in RW and looking up throughout gait Gait Pattern: Step-through pattern;Decreased stride length;Trunk flexed Gait velocity: decreased Stairs: No    Exercises General Exercises - Lower Extremity Long Arc Quad: AROM;Both;20 reps;Seated Hip ABduction/ADduction: AROM;Both;20 reps;Seated Hip Flexion/Marching: AROM;Both;20 reps;Seated   PT Diagnosis:    PT Problem List:   PT Treatment Interventions:     PT Goals Acute Rehab PT Goals PT Goal: Sit to Stand -  Progress: Progressing toward goal PT Goal: Ambulate - Progress: Progressing toward goal PT Goal: Perform Home Exercise Program - Progress: Progressing toward goal  Visit Information  Last PT Received On: 11/24/11 Assistance Needed: +1    Subjective Data  Subjective: I haven't been able to comb my hair since I've been here   Cognition  Overall Cognitive Status: Appears within functional limits for tasks assessed/performed Arousal/Alertness: Awake/alert Orientation Level: Appears intact for tasks assessed Behavior During Session: [facility] for tasks performed    Balance  Static Standing Balance Static Standing - Balance Support: No upper extremity supported Static Standing - Level of Assistance: 5: Stand by assistance Static Standing - Comment/# of Minutes: 4 min during functional activity at sink  End of Session PT - End of Session Equipment Utilized During Treatment: Gait belt Activity Tolerance: Patient tolerated treatment well Patient left: in chair;with call bell/phone within reach   GP     Delorse Lek 11/24/2011, 9:12 AM Delaney Meigs, PT [phone]

## 2011-11-24 NOTE — Discharge Summary (Signed)
Patient seen and examined by me.  Agree with d/c. Will need close follow up.  Elevate left arm.  Marlin Canary DO

## 2011-11-24 NOTE — Progress Notes (Signed)
Pt. Complaining of "not being able to catch my breath."  She stated that she needed to sit up and just couldn't lay down anymore.  O2 saturation 98% on room air.  BP 151/66.  Crackles heard in bilateral lower lobes upon auscultation.  Cheryl [name] paged.  See new orders.  Will continue to monitor patient.

## 2011-11-24 NOTE — Progress Notes (Signed)
Patient seen and examined by me.  See note by Toya Smothers.  Will elevate left arm to help with swelling.  Hope for d/c to SNF today when bed available.  Amire Leazer DO

## 2011-11-25 ENCOUNTER — Encounter (HOSPITAL_COMMUNITY): Payer: Medicare Other

## 2011-11-25 NOTE — Progress Notes (Signed)
Clinical social worker assisted with patient discharge to skilled nursing facility, [facility].  CSW addressed all family questions and concerns. CSW copied chart and added all important documents. CSW also set up patient transportation with [facility]. Clinical Social Worker will sign off for now as social work intervention is no longer needed.   Kiyon Fidalgo, MSW, LCSWA [phone] 

## 2011-12-12 ENCOUNTER — Other Ambulatory Visit: Payer: Self-pay | Admitting: *Deleted

## 2011-12-12 MED ORDER — METOPROLOL TARTRATE 50 MG PO TABS: 100.0000 mg | ORAL_TABLET | Freq: Two times a day (BID) | ORAL | Status: DC

## 2011-12-12 NOTE — Telephone Encounter (Signed)
Pt needs appointment then refill can be made Fax Received. Refill Completed. Manly Nestle Chowoe (R.M.A)   

## 2011-12-22 ENCOUNTER — Encounter: Payer: Self-pay | Admitting: Vascular Surgery

## 2011-12-23 ENCOUNTER — Encounter: Payer: Self-pay | Admitting: Vascular Surgery

## 2011-12-23 ENCOUNTER — Ambulatory Visit (INDEPENDENT_AMBULATORY_CARE_PROVIDER_SITE_OTHER): Payer: Medicare Other | Admitting: Vascular Surgery

## 2011-12-23 VITALS — BP 118/63 | HR 61 | Resp 16 | Ht 62.5 in | Wt 144.0 lb

## 2011-12-23 DIAGNOSIS — N289 Disorder of kidney and ureter, unspecified: Secondary | ICD-10-CM

## 2011-12-23 NOTE — Progress Notes (Signed)
Subjective:     Patient ID: Cheryl [name], female   DOB: 04-24-1925, 78 y.o.   MRN: [id number]  HPI this 78 year old female who has chronic renal insufficiency but is not on hemodialysis at this time return 6 weeks post left basilic vein transposition. She has had some bloody drainage from the medial incisions where the vein was mobilized. There has been no infection. She has no pain or numbness in the left hand. She is not on dialysis yet.  Review of Systems     Objective:   Physical ExamBP 118/63  Pulse 61  Resp 16  Ht 5' 2.5" (1.588 m)  Wt 144 lb (65.318 kg)  BMI 25.92 kg/m2  General well-developed elderly female in no apparent stress Left upper extremity with excellent pulse and palpable thrill in left upper extremity basilic vein transposition. No evidence of ischemia left hand. There is evidence of an old hematoma in the vein mobilization wound in the mid upper arm more medially and posteriorly. There is no drainage and no evidence of infection.    Assessment:     Status post left basilic vein transposition with resolving hematoma posteriorly away from the fistula itself-end-stage renal disease but not on bile since    Plan:     Hematoma in the arm should resolve with time. Fistula appears to be excellent and should be good for dialysis if needed Return to see [place] on when necessary basis

## 2011-12-26 ENCOUNTER — Telehealth: Payer: Self-pay | Admitting: Cardiology

## 2011-12-26 NOTE — Telephone Encounter (Signed)
Pt needs metoprolol refill, taking  two a day now, so needs refill sooner at  [facility] on [address], 30 day supply

## 2011-12-31 ENCOUNTER — Other Ambulatory Visit: Payer: Self-pay

## 2011-12-31 MED ORDER — METOPROLOL TARTRATE 100 MG PO TABS: 100.0000 mg | ORAL_TABLET | Freq: Two times a day (BID) | ORAL | Status: DC

## 2011-12-31 NOTE — Telephone Encounter (Signed)
..   Requested Prescriptions   Pending Prescriptions Disp Refills  . metoprolol (LOPRESSOR) 50 MG tablet 120 tablet 0    Sig: Take 2 tablets (100 mg total) by mouth 2 (two) times daily.

## 2012-01-05 NOTE — Telephone Encounter (Signed)
Follow-up:    Patient's daughter called in needing a refill of her metoprolol (LOPRESSOR) 100 MG tablet.  Please call back if you have any questions.

## 2012-02-04 ENCOUNTER — Ambulatory Visit (INDEPENDENT_AMBULATORY_CARE_PROVIDER_SITE_OTHER): Payer: Medicare Other | Admitting: Cardiology

## 2012-02-04 ENCOUNTER — Encounter: Payer: Self-pay | Admitting: Cardiology

## 2012-02-04 VITALS — BP 128/53 | HR 60 | Ht 62.0 in | Wt 142.0 lb

## 2012-02-04 DIAGNOSIS — I4891 Unspecified atrial fibrillation: Secondary | ICD-10-CM

## 2012-02-04 DIAGNOSIS — I2789 Other specified pulmonary heart diseases: Secondary | ICD-10-CM

## 2012-02-04 DIAGNOSIS — I10 Essential (primary) hypertension: Secondary | ICD-10-CM

## 2012-02-04 DIAGNOSIS — N289 Disorder of kidney and ureter, unspecified: Secondary | ICD-10-CM

## 2012-02-04 MED ORDER — METOPROLOL TARTRATE 100 MG PO TABS: 100.0000 mg | ORAL_TABLET | Freq: Two times a day (BID) | ORAL | Status: DC

## 2012-02-04 NOTE — Patient Instructions (Addendum)
The current medical regimen is effective;  continue present plan and medications.  Follow up in 1 year with Dr Hochrein.  You will receive a letter in the mail 2 months before you are due.  Please call us when you receive this letter to schedule your follow up appointment.  

## 2012-02-04 NOTE — Progress Notes (Signed)
HPI The patient presents for followup of atrial fibrillation. Since I last saw her she had a fistula placed for dialysis although she has had stable renal insufficiency and is not yet requiring dialysis. I do see that she was in the hospital last fall and I reviewed this. She had hyponatremia and some mild volume overload. As opposed to previous visits she is not describing any tachycardia palpitations. She's not having any symptomatic fibrillation. She's had no presyncope or syncope. She's had no chest pressure, neck or arm discomfort. She's had no weight gain. She has some mild lower extremity chronic edema. She just feels weak since her last hospitalization.  Allergies  Allergen Reactions  . Codeine Other (See Comments)    unknown  . Sulfonamide Derivatives Other (See Comments)    unknown    Current Outpatient Prescriptions  Medication Sig Dispense Refill  . amLODipine (NORVASC) 5 MG tablet Take 5 mg by mouth daily.        . benzonatate (TESSALON) 100 MG capsule Take 1 capsule (100 mg total) by mouth 2 (two) times daily as needed for cough.  20 capsule  0  . Cholecalciferol (VITAMIN D) 400 UNITS capsule Take 800 Units by mouth daily.       . furosemide (LASIX) 20 MG tablet Take 1 tablet (20 mg total) by mouth daily.  30 tablet  0  . levothyroxine (SYNTHROID, LEVOTHROID) 75 MCG tablet Take 75 mcg by mouth daily.        . Melatonin 5 MG TABS Take 1 tablet by mouth at bedtime.        . metoprolol (LOPRESSOR) 100 MG tablet Take 1 tablet (100 mg total) by mouth 2 (two) times daily.  60 tablet  0  . mirtazapine (REMERON) 15 MG tablet Take 15 mg by mouth at bedtime as needed. For sleep      . multivitamin (THERAGRAN) per tablet Take 1 tablet by mouth daily.        [name] omeprazole (PRILOSEC) 20 MG capsule Take 20 mg by mouth daily.        [name] oxybutynin (DITROPAN-XL) 10 MG 24 hr tablet Take 10 mg by mouth daily.      . traMADol (ULTRAM) 50 MG tablet Take 0.5 tablets (25 mg total) by mouth every 6  (six) hours as needed.  30 tablet  0  . warfarin (COUMADIN) 2.5 MG tablet Take 1 mg by mouth daily.        Past Medical History  Diagnosis Date  . HTN (hypertension)     2 YEARS  . Hypothyroidism   . Temporal arteritis   . Renal insufficiency   . Pulmonary embolism   . Mitral regurgitation     MILD  . Heart failure     PROBABLE WITH A WELL- PRESERVED EJRCTION FRACTION  . Cataracta   . Chronic anemia   . Paroxysmal atrial fibrillation   . Pulmonary HTN     MOD  . S/P cardiac cath     RT.HRT CATH 12/2009: RA MEAN 12, RV 45/11,PA 47/21,PCWP MEAN 26, CO 4.56, CI 2.7 ABNORMAL MYOVIEW 12/2009  . Ischemia     ANTERIOR,EF 75%----NO CATH DONE DUE TO CKD  . CHF (congestive heart failure)   . Irregular heart beat   . Pulmonary embolism 1997  . End stage renal disease   . History of urinary frequency   . GERD (gastroesophageal reflux disease)   . Sleep trouble   . Constipation, chronic   . Hard  of hearing   . Complication of anesthesia   . PONV (postoperative nausea and vomiting)     Past Surgical History  Procedure Date  . Temporal arteny bx   . Cataract surgery   . Eye surgery   . Abdominal hysterectomy     ROS:  Anxious, insomnia.  Otherwise as stated in the HPI and negative for all other systems.  PHYSICAL EXAM BP 128/53  Pulse 60  Ht 5\' 2"  (1.575 m)  Wt 142 lb (64.411 kg)  BMI 25.97 kg/m2 GENERAL:  Frail appearing HEENT:  Pupils equal round and reactive, fundi not visualized, oral mucosa unremarkable, upper dentures NECK:  No jugular venous distention, waveform within normal limits, carotid upstroke brisk and symmetric, no bruits, no thyromegaly LYMPHATICS:  No cervical, inguinal adenopathy LUNGS:  Clear to auscultation bilaterally BACK:  No CVA tenderness CHEST:  Unremarkable HEART:  PMI not displaced or sustained,S1 and S2 within normal limits, no S3, no S4, no clicks, no rubs, no murmurs ABD:  Flat, positive bowel sounds normal in frequency in pitch, no  bruits, no rebound, no guarding, no midline pulsatile mass, no hepatomegaly, no splenomegaly EXT:  2 plus pulses throughout, mild to moderate left greater than right lower strandy edema, no cyanosis no clubbing SKIN:  No rashes no nodules NEURO:  Cranial nerves II through XII grossly intact, motor grossly intact throughout PSYCH:  Cognitively intact, oriented to person place and time   ASSESSMENT AND PLAN  ATRIAL FIBRILLATION -  Since I last saw her she is no longer having symptomatic paroxysms of atrial fibrillation. Therefore, no change in therapy is indicated. She'll continue beta blocker. Ms. JOCILYNN GRADE has a CHA2DS2 - VASc score of 4 with a risk of stroke of 4%  and a HAS - BLED score of 2 with one validation study suggesting a low risk of bleeds.  She will continue on the anticoagulation.  MITRAL REGURGITATION -  This was mild at the last echo and no further imaging is indicated.   HYPERTENSION, BENIGN -  Her blood pressure is controlled. She will continue the medicines as listed.  PULMONARY HYPERTENSION -  She has mild pulmonary hypertension by echo. She has no new symptoms.  No change in therapy is indicated.

## 2012-03-12 ENCOUNTER — Other Ambulatory Visit (HOSPITAL_COMMUNITY): Payer: Self-pay | Admitting: *Deleted

## 2012-03-15 ENCOUNTER — Encounter (HOSPITAL_COMMUNITY)
Admission: RE | Admit: 2012-03-15 | Discharge: 2012-03-15 | Disposition: A | Discharge status: Home / Self Care | Payer: Medicare Other | Source: Ambulatory Visit | Attending: Nephrology | Admitting: Nephrology

## 2012-03-15 DIAGNOSIS — N184 Chronic kidney disease, stage 4 (severe): Secondary | ICD-10-CM | POA: Insufficient documentation

## 2012-03-15 DIAGNOSIS — D638 Anemia in other chronic diseases classified elsewhere: Secondary | ICD-10-CM | POA: Insufficient documentation

## 2012-03-15 LAB — IRON AND TIBC
Iron: 68 ug/dL (ref 42–135)
Saturation Ratios: 28 % (ref 20–55)
TIBC: 243 ug/dL — ABNORMAL LOW (ref 250–470)
UIBC: 175 ug/dL (ref 125–400)

## 2012-03-15 MED ORDER — EPOETIN ALFA 10000 UNIT/ML IJ SOLN
10000.0000 [IU] | INTRAMUSCULAR | Status: DC
  Administered 2012-03-15: 10000 [IU] via SUBCUTANEOUS

## 2012-03-15 MED ORDER — EPOETIN ALFA 10000 UNIT/ML IJ SOLN
INTRAMUSCULAR | Status: AC
  Filled 2012-03-15: qty 1

## 2012-03-22 ENCOUNTER — Encounter (HOSPITAL_COMMUNITY)
Admission: RE | Admit: 2012-03-22 | Discharge: 2012-03-22 | Disposition: A | Discharge status: Home / Self Care | Payer: Medicare Other | Source: Ambulatory Visit | Attending: Nephrology | Admitting: Nephrology

## 2012-03-22 DIAGNOSIS — N184 Chronic kidney disease, stage 4 (severe): Secondary | ICD-10-CM | POA: Insufficient documentation

## 2012-03-22 DIAGNOSIS — D638 Anemia in other chronic diseases classified elsewhere: Secondary | ICD-10-CM | POA: Insufficient documentation

## 2012-03-22 MED ORDER — EPOETIN ALFA 10000 UNIT/ML IJ SOLN
10000.0000 [IU] | INTRAMUSCULAR | Status: DC
  Administered 2012-03-22: 10000 [IU] via SUBCUTANEOUS

## 2012-03-22 MED ORDER — EPOETIN ALFA 10000 UNIT/ML IJ SOLN
INTRAMUSCULAR | Status: AC
  Filled 2012-03-22: qty 1

## 2012-03-26 ENCOUNTER — Other Ambulatory Visit (HOSPITAL_COMMUNITY): Payer: Self-pay | Admitting: *Deleted

## 2012-03-29 ENCOUNTER — Encounter (HOSPITAL_COMMUNITY)
Admission: RE | Admit: 2012-03-29 | Discharge: 2012-03-29 | Disposition: A | Discharge status: Home / Self Care | Payer: Medicare Other | Source: Ambulatory Visit | Attending: Nephrology | Admitting: Nephrology

## 2012-03-29 MED ORDER — EPOETIN ALFA 10000 UNIT/ML IJ SOLN
INTRAMUSCULAR | Status: AC
  Administered 2012-03-29: 10000 [IU] via SUBCUTANEOUS
  Filled 2012-03-29: qty 1

## 2012-03-29 MED ORDER — EPOETIN ALFA 10000 UNIT/ML IJ SOLN: 10000.0000 [IU] | INTRAMUSCULAR | Status: DC

## 2012-04-06 ENCOUNTER — Encounter (HOSPITAL_COMMUNITY)
Admission: RE | Admit: 2012-04-06 | Discharge: 2012-04-06 | Disposition: A | Discharge status: Home / Self Care | Payer: Medicare Other | Source: Ambulatory Visit | Attending: Nephrology | Admitting: Nephrology

## 2012-04-06 MED ORDER — EPOETIN ALFA 10000 UNIT/ML IJ SOLN
INTRAMUSCULAR | Status: AC
  Administered 2012-04-06: 10000 [IU]
  Filled 2012-04-06: qty 1

## 2012-04-12 ENCOUNTER — Other Ambulatory Visit (HOSPITAL_COMMUNITY): Payer: Self-pay | Admitting: *Deleted

## 2012-04-13 ENCOUNTER — Encounter (HOSPITAL_COMMUNITY)
Admission: RE | Admit: 2012-04-13 | Discharge: 2012-04-13 | Disposition: A | Discharge status: Home / Self Care | Payer: Medicare Other | Source: Ambulatory Visit | Attending: Nephrology | Admitting: Nephrology

## 2012-04-13 LAB — RENAL FUNCTION PANEL
CO2: 27 mEq/L (ref 19–32)
Calcium: 9.4 mg/dL (ref 8.4–10.5)
Creatinine, Ser: 2.48 mg/dL — ABNORMAL HIGH (ref 0.50–1.10)
GFR calc Af Amer: 19 mL/min — ABNORMAL LOW (ref >90)
GFR calc non Af Amer: 17 mL/min — ABNORMAL LOW (ref >90)
Glucose, Bld: 111 mg/dL — ABNORMAL HIGH (ref 70–99)
Phosphorus: 3.9 mg/dL (ref 2.3–4.6)
Sodium: 135 mEq/L (ref 135–145)

## 2012-04-13 LAB — HEMOGLOBIN AND HEMATOCRIT, BLOOD: Hemoglobin: 10.4 g/dL — ABNORMAL LOW (ref 12.0–15.0)

## 2012-04-13 LAB — IRON AND TIBC
Iron: 47 ug/dL (ref 42–135)
Saturation Ratios: 16 % — ABNORMAL LOW (ref 20–55)
TIBC: 296 ug/dL (ref 250–470)

## 2012-04-13 LAB — FERRITIN: Ferritin: 114 ng/mL (ref 10–291)

## 2012-04-13 MED ORDER — EPOETIN ALFA 10000 UNIT/ML IJ SOLN
10000.0000 [IU] | INTRAMUSCULAR | Status: DC
  Administered 2012-04-13: 10000 [IU] via SUBCUTANEOUS

## 2012-04-13 MED ORDER — EPOETIN ALFA 10000 UNIT/ML IJ SOLN
INTRAMUSCULAR | Status: AC
  Filled 2012-04-13: qty 1

## 2012-04-14 LAB — PTH, INTACT AND CALCIUM
Calcium, Total (PTH): 9.4 mg/dL (ref 8.4–10.5)
PTH: 159.5 pg/mL — ABNORMAL HIGH (ref 14.0–72.0)

## 2012-04-20 ENCOUNTER — Encounter (HOSPITAL_COMMUNITY)
Admission: RE | Admit: 2012-04-20 | Discharge: 2012-04-20 | Disposition: A | Discharge status: Home / Self Care | Payer: Medicare Other | Source: Ambulatory Visit | Attending: Nephrology | Admitting: Nephrology

## 2012-04-20 DIAGNOSIS — N184 Chronic kidney disease, stage 4 (severe): Secondary | ICD-10-CM | POA: Insufficient documentation

## 2012-04-20 DIAGNOSIS — D638 Anemia in other chronic diseases classified elsewhere: Secondary | ICD-10-CM | POA: Insufficient documentation

## 2012-04-20 LAB — POCT HEMOGLOBIN-HEMACUE: Hemoglobin: 9.2 g/dL — ABNORMAL LOW (ref 12.0–15.0)

## 2012-04-20 MED ORDER — EPOETIN ALFA 10000 UNIT/ML IJ SOLN
INTRAMUSCULAR | Status: AC
  Administered 2012-04-20: 10000 [IU] via SUBCUTANEOUS
  Filled 2012-04-20: qty 1

## 2012-04-20 MED ORDER — EPOETIN ALFA 10000 UNIT/ML IJ SOLN: 10000.0000 [IU] | INTRAMUSCULAR | Status: DC

## 2012-04-21 ENCOUNTER — Other Ambulatory Visit (HOSPITAL_COMMUNITY): Payer: Self-pay | Admitting: *Deleted

## 2012-04-22 ENCOUNTER — Inpatient Hospital Stay (HOSPITAL_COMMUNITY): Admission: RE | Admit: 2012-04-22 | Payer: Medicare Other | Source: Ambulatory Visit

## 2012-04-26 ENCOUNTER — Encounter (HOSPITAL_COMMUNITY)
Admission: RE | Admit: 2012-04-26 | Discharge: 2012-04-26 | Disposition: A | Discharge status: Home / Self Care | Payer: Medicare Other | Source: Ambulatory Visit | Attending: Nephrology | Admitting: Nephrology

## 2012-04-26 MED ORDER — SODIUM CHLORIDE 0.9 % IV SOLN: INTRAVENOUS | Status: DC

## 2012-04-26 MED ORDER — EPOETIN ALFA 10000 UNIT/ML IJ SOLN
10000.0000 [IU] | INTRAMUSCULAR | Status: DC
  Administered 2012-04-26: 10000 [IU] via SUBCUTANEOUS

## 2012-04-26 MED ORDER — EPOETIN ALFA 10000 UNIT/ML IJ SOLN
INTRAMUSCULAR | Status: AC
  Filled 2012-04-26: qty 1

## 2012-04-26 MED ORDER — SODIUM CHLORIDE 0.9 % IV SOLN
1020.0000 mg | Freq: Once | INTRAVENOUS | Status: AC
  Administered 2012-04-26: 1020 mg via INTRAVENOUS
  Filled 2012-04-26: qty 34

## 2012-05-03 ENCOUNTER — Encounter (HOSPITAL_COMMUNITY)
Admission: RE | Admit: 2012-05-03 | Discharge: 2012-05-03 | Disposition: A | Discharge status: Home / Self Care | Payer: Medicare Other | Source: Ambulatory Visit | Attending: Nephrology | Admitting: Nephrology

## 2012-05-03 LAB — POCT HEMOGLOBIN-HEMACUE: Hemoglobin: 10.7 g/dL — ABNORMAL LOW (ref 12.0–15.0)

## 2012-05-03 MED ORDER — EPOETIN ALFA 10000 UNIT/ML IJ SOLN
INTRAMUSCULAR | Status: AC
  Filled 2012-05-03: qty 1

## 2012-05-03 MED ORDER — EPOETIN ALFA 10000 UNIT/ML IJ SOLN
10000.0000 [IU] | INTRAMUSCULAR | Status: DC
  Administered 2012-05-03: 10000 [IU] via SUBCUTANEOUS

## 2012-05-11 ENCOUNTER — Encounter (HOSPITAL_COMMUNITY)
Admission: RE | Admit: 2012-05-11 | Discharge: 2012-05-11 | Disposition: A | Discharge status: Home / Self Care | Payer: Medicare Other | Source: Ambulatory Visit | Attending: Nephrology | Admitting: Nephrology

## 2012-05-11 LAB — IRON AND TIBC
Iron: 92 ug/dL (ref 42–135)
TIBC: 224 ug/dL — ABNORMAL LOW (ref 250–470)

## 2012-05-11 LAB — POCT HEMOGLOBIN-HEMACUE: Hemoglobin: 11.5 g/dL — ABNORMAL LOW (ref 12.0–15.0)

## 2012-05-11 MED ORDER — EPOETIN ALFA 10000 UNIT/ML IJ SOLN
INTRAMUSCULAR | Status: AC
  Filled 2012-05-11: qty 1

## 2012-05-11 MED ORDER — EPOETIN ALFA 10000 UNIT/ML IJ SOLN
10000.0000 [IU] | INTRAMUSCULAR | Status: DC
  Administered 2012-05-11: 10000 [IU] via SUBCUTANEOUS

## 2012-05-12 LAB — FERRITIN: Ferritin: 1557 ng/mL — ABNORMAL HIGH (ref 10–291)

## 2012-05-17 ENCOUNTER — Encounter (HOSPITAL_COMMUNITY)
Admission: RE | Admit: 2012-05-17 | Discharge: 2012-05-17 | Disposition: A | Discharge status: Home / Self Care | Payer: Medicare Other | Source: Ambulatory Visit | Attending: Nephrology | Admitting: Nephrology

## 2012-05-17 MED ORDER — EPOETIN ALFA 10000 UNIT/ML IJ SOLN
INTRAMUSCULAR | Status: AC
  Administered 2012-05-17: 10000 [IU] via SUBCUTANEOUS
  Filled 2012-05-17: qty 1

## 2012-05-17 MED ORDER — EPOETIN ALFA 10000 UNIT/ML IJ SOLN: 10000.0000 [IU] | INTRAMUSCULAR | Status: DC

## 2012-05-25 ENCOUNTER — Encounter (HOSPITAL_COMMUNITY)
Admission: RE | Admit: 2012-05-25 | Discharge: 2012-05-25 | Disposition: A | Discharge status: Home / Self Care | Payer: Medicare Other | Source: Ambulatory Visit | Attending: Nephrology | Admitting: Nephrology

## 2012-05-25 DIAGNOSIS — N184 Chronic kidney disease, stage 4 (severe): Secondary | ICD-10-CM | POA: Insufficient documentation

## 2012-05-25 DIAGNOSIS — D638 Anemia in other chronic diseases classified elsewhere: Secondary | ICD-10-CM | POA: Insufficient documentation

## 2012-05-25 LAB — POCT HEMOGLOBIN-HEMACUE: Hemoglobin: 10.6 g/dL — ABNORMAL LOW (ref 12.0–15.0)

## 2012-05-25 MED ORDER — EPOETIN ALFA 10000 UNIT/ML IJ SOLN: 10000.0000 [IU] | INTRAMUSCULAR | Status: DC

## 2012-05-25 MED ORDER — EPOETIN ALFA 10000 UNIT/ML IJ SOLN
INTRAMUSCULAR | Status: AC
  Administered 2012-05-25: 10000 [IU] via SUBCUTANEOUS
  Filled 2012-05-25: qty 1

## 2012-05-31 ENCOUNTER — Encounter (HOSPITAL_COMMUNITY): Payer: Medicare Other

## 2012-06-01 ENCOUNTER — Encounter (HOSPITAL_COMMUNITY)
Admission: RE | Admit: 2012-06-01 | Discharge: 2012-06-01 | Disposition: A | Discharge status: Home / Self Care | Payer: Medicare Other | Source: Ambulatory Visit | Attending: Nephrology | Admitting: Nephrology

## 2012-06-01 ENCOUNTER — Other Ambulatory Visit (HOSPITAL_COMMUNITY): Payer: Self-pay | Admitting: *Deleted

## 2012-06-01 LAB — POCT HEMOGLOBIN-HEMACUE: Hemoglobin: 10.9 g/dL — ABNORMAL LOW (ref 12.0–15.0)

## 2012-06-01 MED ORDER — EPOETIN ALFA 10000 UNIT/ML IJ SOLN
10000.0000 [IU] | INTRAMUSCULAR | Status: DC
  Administered 2012-06-01: 10000 [IU] via SUBCUTANEOUS

## 2012-06-08 ENCOUNTER — Other Ambulatory Visit (HOSPITAL_COMMUNITY): Payer: Self-pay

## 2012-06-09 ENCOUNTER — Encounter (HOSPITAL_COMMUNITY)
Admission: RE | Admit: 2012-06-09 | Discharge: 2012-06-09 | Disposition: A | Discharge status: Home / Self Care | Payer: Medicare Other | Source: Ambulatory Visit | Attending: Nephrology | Admitting: Nephrology

## 2012-06-09 LAB — IRON AND TIBC
Saturation Ratios: 34 % (ref 20–55)
TIBC: 184 ug/dL — ABNORMAL LOW (ref 250–470)

## 2012-06-09 LAB — POCT HEMOGLOBIN-HEMACUE: Hemoglobin: 11.2 g/dL — ABNORMAL LOW (ref 12.0–15.0)

## 2012-06-09 MED ORDER — EPOETIN ALFA 10000 UNIT/ML IJ SOLN
10000.0000 [IU] | INTRAMUSCULAR | Status: DC
  Administered 2012-06-09: 10000 [IU] via SUBCUTANEOUS

## 2012-06-09 MED ORDER — EPOETIN ALFA 10000 UNIT/ML IJ SOLN
INTRAMUSCULAR | Status: AC
  Filled 2012-06-09: qty 1

## 2012-06-16 ENCOUNTER — Encounter (HOSPITAL_COMMUNITY)
Admission: RE | Admit: 2012-06-16 | Discharge: 2012-06-16 | Disposition: A | Discharge status: Home / Self Care | Payer: Medicare Other | Source: Ambulatory Visit | Attending: Nephrology | Admitting: Nephrology

## 2012-06-16 LAB — RENAL FUNCTION PANEL
CO2: 26 mEq/L (ref 19–32)
Calcium: 9 mg/dL (ref 8.4–10.5)
Chloride: 91 mEq/L — ABNORMAL LOW (ref 96–112)
Creatinine, Ser: 2.8 mg/dL — ABNORMAL HIGH (ref 0.50–1.10)
GFR calc Af Amer: 17 mL/min — ABNORMAL LOW (ref >90)
GFR calc non Af Amer: 14 mL/min — ABNORMAL LOW (ref >90)
Glucose, Bld: 114 mg/dL — ABNORMAL HIGH (ref 70–99)

## 2012-06-16 LAB — POCT HEMOGLOBIN-HEMACUE: Hemoglobin: 11.4 g/dL — ABNORMAL LOW (ref 12.0–15.0)

## 2012-06-16 MED ORDER — EPOETIN ALFA 10000 UNIT/ML IJ SOLN
INTRAMUSCULAR | Status: AC
  Filled 2012-06-16: qty 1

## 2012-06-16 MED ORDER — EPOETIN ALFA 10000 UNIT/ML IJ SOLN
10000.0000 [IU] | INTRAMUSCULAR | Status: DC
  Administered 2012-06-16: 10000 [IU] via SUBCUTANEOUS

## 2012-06-17 LAB — PTH, INTACT AND CALCIUM
Calcium, Total (PTH): 8.6 mg/dL (ref 8.4–10.5)
PTH: 179 pg/mL — ABNORMAL HIGH (ref 14.0–72.0)

## 2012-06-17 LAB — VITAMIN D 25 HYDROXY (VIT D DEFICIENCY, FRACTURES): Vit D, 25-Hydroxy: 55 ng/mL (ref 30–89)

## 2012-06-24 ENCOUNTER — Encounter (HOSPITAL_COMMUNITY)
Admission: RE | Admit: 2012-06-24 | Discharge: 2012-06-24 | Disposition: A | Discharge status: Home / Self Care | Payer: Medicare Other | Source: Ambulatory Visit | Attending: Nephrology | Admitting: Nephrology

## 2012-06-24 DIAGNOSIS — D638 Anemia in other chronic diseases classified elsewhere: Secondary | ICD-10-CM | POA: Insufficient documentation

## 2012-06-24 DIAGNOSIS — N184 Chronic kidney disease, stage 4 (severe): Secondary | ICD-10-CM | POA: Insufficient documentation

## 2012-06-24 MED ORDER — EPOETIN ALFA 10000 UNIT/ML IJ SOLN
INTRAMUSCULAR | Status: AC
  Filled 2012-06-24: qty 1

## 2012-06-24 MED ORDER — EPOETIN ALFA 10000 UNIT/ML IJ SOLN
10000.0000 [IU] | INTRAMUSCULAR | Status: DC
  Administered 2012-06-24: 10000 [IU] via SUBCUTANEOUS

## 2012-06-28 ENCOUNTER — Emergency Department (HOSPITAL_COMMUNITY): Payer: Medicare Other

## 2012-06-28 ENCOUNTER — Other Ambulatory Visit: Payer: Self-pay

## 2012-06-28 ENCOUNTER — Inpatient Hospital Stay (HOSPITAL_COMMUNITY)
Admission: EM | Admit: 2012-06-28 | Discharge: 2012-07-03 | DRG: 480 | Disposition: A | Discharge status: Skilled Nursing Facility | Payer: Medicare Other | Attending: Internal Medicine | Admitting: Internal Medicine

## 2012-06-28 ENCOUNTER — Encounter (HOSPITAL_COMMUNITY): Payer: Self-pay

## 2012-06-28 DIAGNOSIS — I2789 Other specified pulmonary heart diseases: Secondary | ICD-10-CM | POA: Diagnosis present

## 2012-06-28 DIAGNOSIS — W19XXXA Unspecified fall, initial encounter: Secondary | ICD-10-CM | POA: Diagnosis present

## 2012-06-28 DIAGNOSIS — W010XXA Fall on same level from slipping, tripping and stumbling without subsequent striking against object, initial encounter: Secondary | ICD-10-CM | POA: Diagnosis present

## 2012-06-28 DIAGNOSIS — S72009A Fracture of unspecified part of neck of unspecified femur, initial encounter for closed fracture: Secondary | ICD-10-CM

## 2012-06-28 DIAGNOSIS — S72143A Displaced intertrochanteric fracture of unspecified femur, initial encounter for closed fracture: Principal | ICD-10-CM | POA: Diagnosis present

## 2012-06-28 DIAGNOSIS — K5909 Other constipation: Secondary | ICD-10-CM | POA: Diagnosis present

## 2012-06-28 DIAGNOSIS — Z7901 Long term (current) use of anticoagulants: Secondary | ICD-10-CM

## 2012-06-28 DIAGNOSIS — S72002A Fracture of unspecified part of neck of left femur, initial encounter for closed fracture: Secondary | ICD-10-CM

## 2012-06-28 DIAGNOSIS — I509 Heart failure, unspecified: Secondary | ICD-10-CM

## 2012-06-28 DIAGNOSIS — I059 Rheumatic mitral valve disease, unspecified: Secondary | ICD-10-CM | POA: Diagnosis present

## 2012-06-28 DIAGNOSIS — H9190 Unspecified hearing loss, unspecified ear: Secondary | ICD-10-CM | POA: Insufficient documentation

## 2012-06-28 DIAGNOSIS — I480 Paroxysmal atrial fibrillation: Secondary | ICD-10-CM | POA: Insufficient documentation

## 2012-06-28 DIAGNOSIS — Z86711 Personal history of pulmonary embolism: Secondary | ICD-10-CM

## 2012-06-28 DIAGNOSIS — I10 Essential (primary) hypertension: Secondary | ICD-10-CM | POA: Diagnosis present

## 2012-06-28 DIAGNOSIS — E875 Hyperkalemia: Secondary | ICD-10-CM

## 2012-06-28 DIAGNOSIS — I120 Hypertensive chronic kidney disease with stage 5 chronic kidney disease or end stage renal disease: Secondary | ICD-10-CM | POA: Diagnosis present

## 2012-06-28 DIAGNOSIS — I272 Pulmonary hypertension, unspecified: Secondary | ICD-10-CM | POA: Insufficient documentation

## 2012-06-28 DIAGNOSIS — E871 Hypo-osmolality and hyponatremia: Secondary | ICD-10-CM | POA: Diagnosis present

## 2012-06-28 DIAGNOSIS — N186 End stage renal disease: Secondary | ICD-10-CM | POA: Diagnosis present

## 2012-06-28 DIAGNOSIS — K5900 Constipation, unspecified: Secondary | ICD-10-CM | POA: Diagnosis present

## 2012-06-28 DIAGNOSIS — Z992 Dependence on renal dialysis: Secondary | ICD-10-CM

## 2012-06-28 DIAGNOSIS — I4891 Unspecified atrial fibrillation: Secondary | ICD-10-CM

## 2012-06-28 DIAGNOSIS — D649 Anemia, unspecified: Secondary | ICD-10-CM

## 2012-06-28 DIAGNOSIS — I4510 Unspecified right bundle-branch block: Secondary | ICD-10-CM | POA: Diagnosis present

## 2012-06-28 DIAGNOSIS — E039 Hypothyroidism, unspecified: Secondary | ICD-10-CM | POA: Diagnosis present

## 2012-06-28 DIAGNOSIS — N390 Urinary tract infection, site not specified: Secondary | ICD-10-CM

## 2012-06-28 DIAGNOSIS — S72142A Displaced intertrochanteric fracture of left femur, initial encounter for closed fracture: Secondary | ICD-10-CM

## 2012-06-28 DIAGNOSIS — I5032 Chronic diastolic (congestive) heart failure: Secondary | ICD-10-CM | POA: Diagnosis present

## 2012-06-28 HISTORY — DX: Chronic diastolic (congestive) heart failure: I50.32

## 2012-06-28 LAB — CBC WITH DIFFERENTIAL/PLATELET
Basophils Absolute: 0 10*3/uL (ref 0.0–0.1)
Basophils Relative: 0 % (ref 0–1)
Eosinophils Absolute: 0.1 10*3/uL (ref 0.0–0.7)
Eosinophils Relative: 1 % (ref 0–5)
HCT: 38.2 % (ref 36.0–46.0)
MCH: 33.9 pg (ref 26.0–34.0)
MCHC: 34.3 g/dL (ref 30.0–36.0)
MCV: 99 fL (ref 78.0–100.0)
Monocytes Absolute: 0.8 10*3/uL (ref 0.1–1.0)
Neutro Abs: 7.7 10*3/uL (ref 1.7–7.7)
RDW: 16.5 % — ABNORMAL HIGH (ref 11.5–15.5)

## 2012-06-28 LAB — BASIC METABOLIC PANEL
BUN: 52 mg/dL — ABNORMAL HIGH (ref 6–23)
Chloride: 88 mEq/L — ABNORMAL LOW (ref 96–112)
Creatinine, Ser: 2.45 mg/dL — ABNORMAL HIGH (ref 0.50–1.10)
GFR calc Af Amer: 20 mL/min — ABNORMAL LOW (ref >90)

## 2012-06-28 LAB — TROPONIN I: Troponin I: <0.3 ng/mL (ref <0.30)

## 2012-06-28 MED ORDER — MORPHINE SULFATE 2 MG/ML IJ SOLN
0.5000 mg | INTRAMUSCULAR | Status: DC | PRN
  Administered 2012-06-28 – 2012-06-29 (×7): 0.5 mg via INTRAVENOUS
  Filled 2012-06-28 (×7): qty 1

## 2012-06-28 MED ORDER — SODIUM CHLORIDE 0.9 % IV SOLN
INTRAVENOUS | Status: DC
  Administered 2012-06-28: 18:00:00 via INTRAVENOUS
  Filled 2012-06-28 (×3): qty 1000

## 2012-06-28 MED ORDER — AMLODIPINE BESYLATE 5 MG PO TABS
5.0000 mg | ORAL_TABLET | Freq: Every day | ORAL | Status: DC
  Administered 2012-06-29 – 2012-07-03 (×4): 5 mg via ORAL
  Filled 2012-06-28 (×5): qty 1

## 2012-06-28 MED ORDER — CHOLECALCIFEROL 10 MCG (400 UNIT) PO TABS
800.0000 [IU] | ORAL_TABLET | Freq: Every day | ORAL | Status: DC
  Administered 2012-06-29 – 2012-07-03 (×4): 800 [IU] via ORAL
  Filled 2012-06-28 (×5): qty 2

## 2012-06-28 MED ORDER — POLYETHYLENE GLYCOL 3350 17 G PO PACK
17.0000 g | PACK | Freq: Every day | ORAL | Status: DC
  Administered 2012-06-29 – 2012-07-03 (×4): 17 g via ORAL
  Filled 2012-06-28 (×6): qty 1

## 2012-06-28 MED ORDER — DOCUSATE SODIUM 100 MG PO CAPS
100.0000 mg | ORAL_CAPSULE | Freq: Two times a day (BID) | ORAL | Status: DC
  Administered 2012-06-28 – 2012-07-03 (×10): 100 mg via ORAL
  Filled 2012-06-28 (×12): qty 1

## 2012-06-28 MED ORDER — DEXTROSE-NACL 5-0.45 % IV SOLN: INTRAVENOUS | Status: DC

## 2012-06-28 MED ORDER — VITAMIN D 400 UNITS PO CAPS: 800.0000 [IU] | ORAL_CAPSULE | Freq: Every day | ORAL | Status: DC

## 2012-06-28 MED ORDER — OXYBUTYNIN CHLORIDE ER 10 MG PO TB24
10.0000 mg | ORAL_TABLET | Freq: Every day | ORAL | Status: DC
  Administered 2012-06-29 – 2012-07-03 (×4): 10 mg via ORAL
  Filled 2012-06-28 (×5): qty 1

## 2012-06-28 MED ORDER — ONDANSETRON HCL 4 MG/2ML IJ SOLN
4.0000 mg | Freq: Once | INTRAMUSCULAR | Status: AC
  Administered 2012-06-28: 4 mg via INTRAVENOUS
  Filled 2012-06-28: qty 2

## 2012-06-28 MED ORDER — MORPHINE SULFATE 2 MG/ML IJ SOLN
INTRAMUSCULAR | Status: AC
  Filled 2012-06-28: qty 1

## 2012-06-28 MED ORDER — MELATONIN 5 MG PO TABS: 5.0000 mg | ORAL_TABLET | Freq: Every day | ORAL | Status: DC

## 2012-06-28 MED ORDER — SERTRALINE HCL 25 MG PO TABS
25.0000 mg | ORAL_TABLET | Freq: Every day | ORAL | Status: DC
  Administered 2012-06-29 – 2012-07-03 (×4): 25 mg via ORAL
  Filled 2012-06-28 (×5): qty 1

## 2012-06-28 MED ORDER — FENTANYL CITRATE 0.05 MG/ML IJ SOLN
25.0000 ug | Freq: Once | INTRAMUSCULAR | Status: AC
  Administered 2012-06-28: 25 ug via INTRAVENOUS
  Filled 2012-06-28: qty 2

## 2012-06-28 MED ORDER — METOPROLOL TARTRATE 100 MG PO TABS: 100.0000 mg | ORAL_TABLET | Freq: Two times a day (BID) | ORAL | Status: DC

## 2012-06-28 MED ORDER — FENTANYL CITRATE 0.05 MG/ML IJ SOLN
50.0000 ug | Freq: Once | INTRAMUSCULAR | Status: AC
  Administered 2012-06-28: 50 ug via INTRAVENOUS
  Filled 2012-06-28: qty 2

## 2012-06-28 MED ORDER — LEVOTHYROXINE SODIUM 75 MCG PO TABS
75.0000 ug | ORAL_TABLET | Freq: Every day | ORAL | Status: DC
  Administered 2012-06-29 – 2012-07-03 (×4): 75 ug via ORAL
  Filled 2012-06-28 (×5): qty 1

## 2012-06-28 MED ORDER — CALCITRIOL 0.25 MCG PO CAPS
0.2500 ug | ORAL_CAPSULE | Freq: Every day | ORAL | Status: DC
  Administered 2012-06-29 – 2012-07-03 (×4): 0.25 ug via ORAL
  Filled 2012-06-28 (×5): qty 1

## 2012-06-28 MED ORDER — PANTOPRAZOLE SODIUM 40 MG PO TBEC
40.0000 mg | DELAYED_RELEASE_TABLET | Freq: Every day | ORAL | Status: DC
  Administered 2012-06-29 – 2012-07-03 (×5): 40 mg via ORAL
  Filled 2012-06-28 (×5): qty 1

## 2012-06-28 MED ORDER — METOPROLOL TARTRATE 100 MG PO TABS
100.0000 mg | ORAL_TABLET | Freq: Two times a day (BID) | ORAL | Status: DC
  Administered 2012-06-28 – 2012-07-03 (×10): 100 mg via ORAL
  Filled 2012-06-28 (×11): qty 1

## 2012-06-28 MED ORDER — ADULT MULTIVITAMIN W/MINERALS CH
1.0000 | ORAL_TABLET | Freq: Every day | ORAL | Status: DC
  Administered 2012-06-29 – 2012-07-03 (×4): 1 via ORAL
  Filled 2012-06-28 (×5): qty 1

## 2012-06-28 NOTE — H&P (Signed)
[facility] History and Physical  Cheryl [name] ZOX:[id number] DOB: [birth year] DOA: 06/28/2012  Referring physician: Benjiman Core PCP: Abigail Miyamoto, MD  Specialists: Orthopedics  Chief Complaint: 78 y/o WF presented after fall on L hip  HPI: Cheryl [name] is a 79 y.o. female with an extensive PMH of HTN, ESRD, CHF, and pulmonary HTN. She presents after fall in her home around 8:30 am where she tripped on a rug. She was unable to move and called out for 30 min prior to help arriving. She noted 10/10 pain in her L hip. She denied dizziness and loss of consciousness; she did not hit her head. She ambulates at home without assistance - denied difficulty walking/balancing prior to fall. She reports no other injury in the fall. She denies SOB, chest pain, N/V, pain outside of her hip. Pt notes sleeping on multiple pillows at night.  She has no prior hospitalizations or significant surgeries. She has a stent placed in her L arm in preparation for dialysis.  Allergies: Codeine, Sulfa abx    Past Medical History  Diagnosis Date  . HTN (hypertension)     2 YEARS  . Hypothyroidism   . Temporal arteritis   . Renal insufficiency   . Pulmonary embolism   . Mitral regurgitation     a. mild by 01/2010 echo.  . Chronic diastolic CHF (congestive heart failure)     a. 01/2010 Echo: EF 60%, mild LVH, High LV filling pressures, No RWMA, mild MR, PASP .  Arman Bogus   . Chronic anemia   . Paroxysmal atrial fibrillation   . Pulmonary HTN     a. PASP by 01/2010 echo.  . S/P cardiac cath     a. 12/2009 R Heart Cath:  RA 12, RV 45/11, PA 47/21, PCWP MEAN 26, CO 4.56, CI 2.7.  . Ischemia     a. 12/2009 Myoview: Ant ischemia, EF 75%----NO CATH DONE DUE TO CKD  . CHF (congestive heart failure)   . Irregular heart beat   . Pulmonary embolism 1997  . End stage renal disease   . History of urinary frequency   . GERD (gastroesophageal reflux disease)   . Sleep trouble    . Constipation, chronic   . Hard of hearing   . Complication of anesthesia   . PONV (postoperative nausea and vomiting)    Past Surgical History  Procedure Laterality Date  . Temporal arteny bx    . Cataract surgery    . Eye surgery    . Abdominal hysterectomy     Social History:  reports that she has never smoked. She has never used smokeless tobacco. She reports that she does not drink alcohol or use illicit drugs. Pt lives alone in an apt and has no difficulty with ADLs. Her daughters are present at bedside  Allergies  Allergen Reactions  . Codeine Other (See Comments)    unknown  . Sulfa Antibiotics     unknown    Family History  Problem Relation Age of Onset  . Heart attack Mother 33  . Stroke Father 75  . Hypertension Sister   . Other      THERE IS NO EARLY HEART DISEASE  . Cancer Brother     prostate  . Diabetes Daughter   . Hyperlipidemia Daughter   . Hypertension Daughter     Prior to Admission medications   Medication Sig Start Date End Date Taking? Authorizing Provider  amLODipine (NORVASC) 5 MG tablet Take 5  mg by mouth daily.     Yes Historical Provider, MD  calcitRIOL (ROCALTROL) 0.25 MCG capsule Take 0.25 mcg by mouth daily.   Yes Historical Provider, MD  Cholecalciferol (VITAMIN D) 400 UNITS capsule Take 800 Units by mouth daily.    Yes Historical Provider, MD  furosemide (LASIX) 40 MG tablet Take 40 mg by mouth daily.   Yes Historical Provider, MD  levothyroxine (SYNTHROID, LEVOTHROID) 75 MCG tablet Take 75 mcg by mouth daily.     Yes Historical Provider, MD  Melatonin 5 MG TABS Take 5 mg by mouth at bedtime.    Yes Historical Provider, MD  metoprolol (LOPRESSOR) 100 MG tablet Take 1 tablet (100 mg total) by mouth 2 (two) times daily. 02/04/12  Yes Rollene Rotunda, MD  Multiple Vitamin (MULTIVITAMIN WITH MINERALS) TABS Take 1 tablet by mouth daily. Centrum Silver   Yes Historical Provider, MD  omeprazole (PRILOSEC) 20 MG capsule Take 20 mg by mouth  daily.     Yes Historical Provider, MD  oxybutynin (DITROPAN-XL) 10 MG 24 hr tablet Take 10 mg by mouth daily. 10/09/11  Yes Historical Provider, MD  polyethylene glycol (MIRALAX / GLYCOLAX) packet Take 17 g by mouth daily.   Yes Historical Provider, MD  sertraline (ZOLOFT) 25 MG tablet Take 25 mg by mouth daily.   Yes Historical Provider, MD  warfarin (COUMADIN) 3 MG tablet Take 3 mg by mouth See admin instructions. Take 1 tablet on Sun, Tues, Wed, Thurs, Sat   Yes Historical Provider, MD  warfarin (COUMADIN) 4 MG tablet Take 4 mg by mouth 2 (two) times a week. Take 1 tablet on Mon and Fri   Yes Historical Provider, MD   Physical Exam: Filed Vitals:   06/28/12 1000 06/28/12 1031 06/28/12 1130 06/28/12 1230  BP: 148/96 148/96 117/35 112/35  Pulse: 63 59 58   Temp:      TempSrc:      Resp:   12 16  SpO2: 99% 99% 99%      General:  Elderly female, awake, lying in bed - visibly uncomfortable. She is alert and oriented x3  ENT: Dry mucus membranes, phlegm present in the roof of mouth  Neck: supple, no lymphadenopathy  Cardiovascular: regular rate, irregular rhythm  Respiratory: clear to auscultation bilaterally  Abdomen: +BS, soft, NT  Skin: intact, no rashes, warm to touch   Musculoskeletal: unable to move L leg, ROM in L foot was normal, dorsalis pedis pulse present. Strength in R leg - normal. No peripheral edema seen on exam  Psychiatric: Alert, oriented, cooperative, well groomed  Neurologic: Sensation intact in LE bilaterally  Labs on Admission:  Basic Metabolic Panel:  Recent Labs Lab 06/28/12 1102  NA 130*  K 3.6  CL 88*  CO2 30  GLUCOSE 108*  BUN 52*  CREATININE 2.45*  CALCIUM 9.8   CBC:  Recent Labs Lab 06/24/12 1457 06/28/12 1102  WBC  --  9.5  NEUTROABS  --  7.7  HGB 10.6* 13.1  HCT  --  38.2  MCV  --  99.0  PLT  --  224    Radiological Exams on Admission: Dg Hip Complete Left  06/28/2012   *RADIOLOGY REPORT*  Clinical Data: Larey Seat today, left  hip pain.  LEFT HIP - COMPLETE 2+ VIEW  Comparison: None.  Findings: Intertrochanteric fracture of the left hip with slight foreshortening. A component of the fracture extends into the proximal femur/subtrochanteric region towards the medial proximal femur.  No visible pelvic fractures.  IMPRESSION: Intertrochanteric fracture left hip as described.   Original Report Authenticated By: Davonna Belling, M.D.   Dg Chest Port 1 View  06/28/2012   *RADIOLOGY REPORT*  Clinical Data: Fall, hip fracture, preop  PORTABLE CHEST - 1 VIEW  Comparison: 11/20/2011  Findings: Cardiomediastinal silhouette is stable.  No acute infiltrate or pleural effusion.  No pulmonary edema.  Stable chronic mild interstitial prominence.  IMPRESSION: No active disease.  No significant change.   Original Report Authenticated By: Natasha Mead, M.D.    EKG: Independently reviewed. No ST elevation/depression seen. Pt has long history of abnormal EKGs  Assessment/Plan Principal Problem:   Intertrochanteric fracture of left hip Active Problems:   Atrial fibrillation   HYPERTENSION, BENIGN   PULMONARY HYPERTENSION   End stage renal disease   Hyponatremia   Anemia   Chronic diastolic CHF (congestive heart failure)   Constipation, chronic   L Hip fracture - mechanical fall, with no hx of syncope - Dr. Luiz Blare (orthopedics) is consulted and planning surgery for tomorrow or Wednesday when pts INR is appropriate - hip fx protocol  - holding coumadin   Dehydration - 50/hr normal saline - gentle fluids - hold lasix - monitor for volume overload given pts hx of CHF  [name] with history of DVT/PE - chronic coumadin being held - holding metoprolol for pulse rate of 58 - pt appears stable  - Cardiology consulted  ESRD - Not on dialysis - graft in L arm - Baseline Creatinine ~2 - Follows with Dr. Allena Katz  Diastolic CHF - Echo 01/2010 showed LVEF 60% and mitral regurgitation - Cardiology consulted - IV fluids administered for  dehydration - will monitor closely  Hyponatremia - Appears chronic, baseline sodium 130  Chronic Anemia - Appears chronic, baseline Hgb 11  Chronic Constipation - Continue miralax and colace as needed  Dr. Jodi Geralds of orthopedics consulted Code Status: Full - family indicated living will and that they do not want prolonged life support Family Communication: Daughters at bedside  Disposition Plan: inpatient    Time spent: 60 minutes  Ralene Muskrat PA-S [facility]   If 7PM-7AM, please contact night-coverage [email or web address] Password [facility] 06/28/2012, 1:10 PM  Attending  Patient seen and examined, agree with the assessment and plan. She unfortunately fractured left hip following a mechanical fall. She is chronically anticoagulated, await Ortho eval regarding timing of the hip repair, can always give FFP/vit k to bring the INR down, we can then bridge with Heparin gtt if needed. She is otherwise stable for surgery, doubt further work up needed at this time.  S Ronneisha Jett

## 2012-06-28 NOTE — ED Provider Notes (Signed)
History     CSN: [id number]  Arrival date & time 06/28/12  0945   First MD Initiated Contact with Patient 06/28/12 [phone]      Chief Complaint  Patient presents with  . Fall  . Hip Pain    (Consider location/radiation/quality/duration/timing/severity/associated sxs/prior treatment) Patient is a 79 y.o. female presenting with fall and hip pain. The history is provided by the patient.  Fall Pertinent negatives include no chest pain, no abdominal pain, no headaches and no shortness of breath.  Hip Pain Pertinent negatives include no chest pain, no abdominal pain, no headaches and no shortness of breath.   patient states that she is walking in her foot got caught and she fell down. She states she's had pain in her left hip since it has been unable to walk. It started this morning. No other injury. She states she did not hit her head. No numbness or weakness. She is having difficulty moving the lower extremity due to pain. No abdominal pain. She is on Coumadin.  Past Medical History  Diagnosis Date  . HTN (hypertension)     2 YEARS  . Hypothyroidism   . Temporal arteritis   . Renal insufficiency   . Pulmonary embolism   . Mitral regurgitation     MILD  . Heart failure     PROBABLE WITH A WELL- PRESERVED EJRCTION FRACTION  . Cataracta   . Chronic anemia   . Paroxysmal atrial fibrillation   . Pulmonary HTN     MOD  . S/P cardiac cath     RT.HRT CATH 12/2009: RA MEAN 12, RV 45/11,PA 47/21,PCWP MEAN 26, CO 4.56, CI 2.7 ABNORMAL MYOVIEW 12/2009  . Ischemia     ANTERIOR,EF 75%----NO CATH DONE DUE TO CKD  . CHF (congestive heart failure)   . Irregular heart beat   . Pulmonary embolism 1997  . End stage renal disease   . History of urinary frequency   . GERD (gastroesophageal reflux disease)   . Sleep trouble   . Constipation, chronic   . Hard of hearing   . Complication of anesthesia   . PONV (postoperative nausea and vomiting)     Past Surgical History  Procedure  Laterality Date  . Temporal arteny bx    . Cataract surgery    . Eye surgery    . Abdominal hysterectomy      Family History  Problem Relation Age of Onset  . Heart attack Mother 79  . Stroke Father 26  . Hypertension Sister   . Other      THERE IS NO EARLY HEART DISEASE  . Cancer Brother     prostate  . Diabetes Daughter   . Hyperlipidemia Daughter   . Hypertension Daughter     History  Substance Use Topics  . Smoking status: Never Smoker   . Smokeless tobacco: Never Used  . Alcohol Use: No    OB History   Grav Para Term Preterm Abortions TAB SAB Ect Mult Living                  Review of Systems  Constitutional: Negative for activity change and appetite change.  HENT: Negative for neck stiffness.   Eyes: Negative for pain.  Respiratory: Negative for chest tightness and shortness of breath.   Cardiovascular: Negative for chest pain and leg swelling.  Gastrointestinal: Negative for nausea, vomiting, abdominal pain and diarrhea.  Genitourinary: Negative for flank pain.  Musculoskeletal: Negative for back pain.  Skin: Negative  for rash.  Neurological: Negative for weakness, numbness and headaches.  Psychiatric/Behavioral: Negative for behavioral problems.    Allergies  Codeine and Sulfa antibiotics  Home Medications   Current Outpatient Rx  Name  Route  Sig  Dispense  Refill  . amLODipine (NORVASC) 5 MG tablet   Oral   Take 5 mg by mouth daily.           . calcitRIOL (ROCALTROL) 0.25 MCG capsule   Oral   Take 0.25 mcg by mouth daily.         . Cholecalciferol (VITAMIN D) 400 UNITS capsule   Oral   Take 800 Units by mouth daily.          . furosemide (LASIX) 40 MG tablet   Oral   Take 40 mg by mouth daily.         [name] levothyroxine (SYNTHROID, LEVOTHROID) 75 MCG tablet   Oral   Take 75 mcg by mouth daily.           . Melatonin 5 MG TABS   Oral   Take 5 mg by mouth at bedtime.          . metoprolol (LOPRESSOR) 100 MG tablet    Oral   Take 1 tablet (100 mg total) by mouth 2 (two) times daily.   60 tablet   11   . Multiple Vitamin (MULTIVITAMIN WITH MINERALS) TABS   Oral   Take 1 tablet by mouth daily. Centrum Silver         . omeprazole (PRILOSEC) 20 MG capsule   Oral   Take 20 mg by mouth daily.           [name] oxybutynin (DITROPAN-XL) 10 MG 24 hr tablet   Oral   Take 10 mg by mouth daily.         . polyethylene glycol (MIRALAX / GLYCOLAX) packet   Oral   Take 17 g by mouth daily.         . sertraline (ZOLOFT) 25 MG tablet   Oral   Take 25 mg by mouth daily.         [name] warfarin (COUMADIN) 3 MG tablet   Oral   Take 3 mg by mouth See admin instructions. Take 1 tablet on Sun, Tues, Wed, Thurs, Sat         . warfarin (COUMADIN) 4 MG tablet   Oral   Take 4 mg by mouth 2 (two) times a week. Take 1 tablet on Mon and Fri           BP 148/96  Pulse 59  Temp(Src) 98.1 F (36.7 C) (Oral)  Resp 18  SpO2 99%  Physical Exam  Nursing note and vitals reviewed. Constitutional: She is oriented to person, place, and time. She appears well-developed and well-nourished.  HENT:  Head: Normocephalic and atraumatic.  Eyes: EOM are normal. Pupils are equal, round, and reactive to light.  Neck: Normal range of motion. Neck supple.  Cardiovascular: Normal rate, regular rhythm and normal heart sounds.   No murmur heard. Pulmonary/Chest: Effort normal and breath sounds normal. No respiratory distress. She has no wheezes. She has no rales.  Abdominal: Soft. Bowel sounds are normal. She exhibits no distension. There is no tenderness. There is no rebound and no guarding.  Musculoskeletal: She exhibits tenderness.  Tenderness to left hip with decreased range of motion. Neurovascular intact distally. Strong dorsalis pedis pulse.  Neurological: She is alert and oriented to person, place, and time.  No cranial nerve deficit.  Skin: Skin is warm and dry.  Psychiatric: She has a normal mood and affect. Her  speech is normal.    ED Course  Procedures (including critical care time)  Labs Reviewed  BASIC METABOLIC PANEL - Abnormal; Notable for the following:    Sodium 130 (*)    Chloride 88 (*)    Glucose, Bld 108 (*)    BUN 52 (*)    Creatinine, Ser 2.45 (*)    GFR calc non Af Amer 17 (*)    GFR calc Af Amer 20 (*)    All other components within normal limits  CBC WITH DIFFERENTIAL - Abnormal; Notable for the following:    RBC 3.86 (*)    RDW 16.5 (*)    Neutrophils Relative % 81 (*)    Lymphocytes Relative 9 (*)    All other components within normal limits  PROTIME-INR - Abnormal; Notable for the following:    Prothrombin Time 27.8 (*)    INR 2.76 (*)    All other components within normal limits  TYPE AND SCREEN   Dg Hip Complete Left  06/28/2012   *RADIOLOGY REPORT*  Clinical Data: Larey Seat today, left hip pain.  LEFT HIP - COMPLETE 2+ VIEW  Comparison: None.  Findings: Intertrochanteric fracture of the left hip with slight foreshortening. A component of the fracture extends into the proximal femur/subtrochanteric region towards the medial proximal femur.  No visible pelvic fractures.  IMPRESSION: Intertrochanteric fracture left hip as described.   Original Report Authenticated By: Davonna Belling, M.D.   Dg Chest Port 1 View  06/28/2012   *RADIOLOGY REPORT*  Clinical Data: Fall, hip fracture, preop  PORTABLE CHEST - 1 VIEW  Comparison: 11/20/2011  Findings: Cardiomediastinal silhouette is stable.  No acute infiltrate or pleural effusion.  No pulmonary edema.  Stable chronic mild interstitial prominence.  IMPRESSION: No active disease.  No significant change.   Original Report Authenticated By: Natasha Mead, M.D.     1. Hip fracture requiring operative repair, left, closed, initial encounter     Date: 06/28/2012  Rate: 58  Rhythm: sinus bradycardia  QRS Axis: normal  Intervals: normal  ST/T Wave abnormalities: normal  Conduction Disutrbances:right bundle branch block  Narrative  Interpretation:   Old EKG Reviewed: unchanged     MDM  Patient with likely mechanical fall. Left hip fracture. Will be seen by orthopedics. Also is on Coumadin for poor embolisms in nature fibrillation. She did not hit her head. I have a low suspicion for intracranial injury. She'll be admitted to medicine with a cardiology consult.        Juliet Rude. Rubin Payor, MD 06/28/12 [phone]

## 2012-06-28 NOTE — ED Notes (Signed)
MD Graves at bedside 

## 2012-06-28 NOTE — ED Notes (Signed)
Pt requesting pain medication; EDP notified ?

## 2012-06-28 NOTE — ED Notes (Signed)
PA [place] and PA students at bedside.

## 2012-06-28 NOTE — ED Notes (Signed)
PER EMS: Pt was at home when she fell, complaint of left hip pain. Pt denies head injury and denies LOC. Alert and oriented x 4. VS: BP-120/70, HR-68, O2-96% RA. Pt also has shunt on left arm for dialysis.

## 2012-06-28 NOTE — ED Notes (Signed)
Attempted report x1. 

## 2012-06-28 NOTE — Telephone Encounter (Signed)
.   Requested Prescriptions   Signed Prescriptions Disp Refills  . metoprolol (LOPRESSOR) 100 MG tablet 60 tablet 11    Sig: Take 1 tablet (100 mg total) by mouth 2 (two) times daily.    Authorizing Provider: Rollene Rotunda    Ordering User: Christella Hartigan, Devell Parkerson Judie Petit

## 2012-06-28 NOTE — Consult Note (Signed)
CARDIOLOGY CONSULT NOTE  Patient ID: Cheryl [name] MRN: [id number], DOB/AGE: 77/01/1925   Admit date: 06/28/2012 Date of Consult: 06/28/2012  Primary Physician: Abigail Miyamoto, MD Primary Cardiologist: J. Hochrein, MD  Pt. Profile  79 y/o female with h/o PAF on coumadin who was admitted earlier today with a left hip fx following a fall that we've been asked to eval preoperatively.  Problem List  Past Medical History  Diagnosis Date  . HTN (hypertension)     2 YEARS  . Hypothyroidism   . Temporal arteritis   . Pulmonary embolism 1997  . Mitral regurgitation     a. mild by 01/2010 echo.  . Chronic diastolic CHF (congestive heart failure)     a. 01/2010 Echo: EF 60%, mild LVH, High LV filling pressures, No RWMA, mild MR, PASP .  Arman Bogus   . Chronic anemia   . Paroxysmal atrial fibrillation   . Pulmonary HTN     a. PASP by 01/2010 echo.  . S/P cardiac cath     a. 12/2009 R Heart Cath:  RA 12, RV 45/11, PA 47/21, PCWP MEAN 26, CO 4.56, CI 2.7.  . Ischemia     a. 12/2009 Myoview: Ant ischemia, EF 75%----NO CATH DONE DUE TO CKD  . End stage renal disease     a. s/p AVF - not on dialysis.  [name] History of urinary frequency   . GERD (gastroesophageal reflux disease)   . Sleep trouble   . Constipation, chronic   . Hard of hearing   . Complication of anesthesia   . PONV (postoperative nausea and vomiting)     Past Surgical History  Procedure Laterality Date  . Temporal arteny bx    . Cataract surgery    . Eye surgery    . Abdominal hysterectomy      Allergies  Allergies  Allergen Reactions  . Codeine Other (See Comments)    unknown  . Sulfa Antibiotics     unknown   HPI   79 y/o female with h/o PAF on chronic coumadin anticoagulation.  She has a h/o an abnormal myoview in 12/2009 with anterior ischemia, though cath was not performed 2/2 CKD/ESRD.  She has been stable over the past few years.  She routinely goes to the [employer] where  she takes part in activities and exercise.  She has no recent h/o of chest pain or activity-limiting dyspnea.  She was last seen in clinic in January at which time she had not noted any recurrent afib.  Since January, she has continued to do well w/o afib, palpitations, chest pain, or dyspnea.   She was in her USOH this AM when she was standing at her door and she says that the shoe on her left foot felt stuck to the floor.  She stumbled and then fell onto her left side with resultant left hip/leg pain.  She did not experience presyncope or syncope prior to this event.  She was unable to bear weight and alerted her daughters who brought her into the [facility] ED. Here, imaging of her left hip has shown an intertrochanteric fx.  She currently complains of left hip/leg pain.  She denies chest pain, dyspnea, pnd, orthopnea, n, v, dizziness, syncope, palpitations, or early satiety. We've been asked to eval for preoperative cardiac risk assessment.  Inpatient Medications  . [START ON 06/29/2012] amLODipine  5 mg Oral Daily  . [START ON 06/29/2012] calcitRIOL  0.25 mcg Oral Daily  . [  START ON 06/29/2012] cholecalciferol  800 Units Oral Daily  . docusate sodium  100 mg Oral BID  . [START ON 06/29/2012] levothyroxine  75 mcg Oral Daily  . metoprolol tartrate  100 mg Oral BID  . morphine      . [START ON 06/29/2012] multivitamin with minerals  1 tablet Oral Daily  . [START ON 06/29/2012] oxybutynin  10 mg Oral Daily  . [START ON 06/29/2012] pantoprazole  40 mg Oral Daily  . [START ON 06/29/2012] polyethylene glycol  17 g Oral Daily  . [START ON 06/29/2012] sertraline  25 mg Oral Daily   Family History Family History  Problem Relation Age of Onset  . Heart attack Mother 56  . Stroke Father 65  . Hypertension Sister   . Other      THERE IS NO EARLY HEART DISEASE  . Cancer Brother     prostate  . Diabetes Daughter   . Hyperlipidemia Daughter   . Hypertension Daughter     Social History History   Social  History  . Marital Status: Widowed    Spouse Name: N/A    Number of Children: 2  . Years of Education: N/A   Occupational History  . Retired    Social History Main Topics  . Smoking status: Never Smoker   . Smokeless tobacco: Never Used  . Alcohol Use: No  . Drug Use: No  . Sexually Active: No   Other Topics Concern  . Not on file   Social History Narrative   Widowed with two children, four grandchildren, and several great children.  Lives in [place] by herself.  Active throughout the day @ [employer].    Review of Systems  General:  No chills, fever, night sweats or weight changes.  Cardiovascular:  No chest pain, dyspnea on exertion, edema, orthopnea, palpitations, paroxysmal nocturnal dyspnea. Dermatological: No rash, lesions/masses Respiratory: No cough, dyspnea Urologic: No hematuria, dysuria Abdominal:   +++ constipation.  No nausea, vomiting, diarrhea, bright red blood per rectum, melena, or hematemesis Neurologic:  No visual changes, wkns, changes in mental status. MSK:  +++ left hip/leg pain. All other systems reviewed and are otherwise negative except as noted above.  Physical Exam  Blood pressure 112/35, pulse 58, temperature 98.1 F (36.7 C), temperature source Oral, resp. rate 16, SpO2 99.00%.  General: Pleasant, NAD Psych: Normal affect. Neuro: Alert and oriented X 3. Moves all extremities spontaneously. HEENT: HOH otw Normal  Neck: Supple without bruits or JVD. Lungs:  Resp regular and unlabored, CTA. Heart: RRR no s3, s4, or murmurs. Abdomen: Soft, non-tender, non-distended, BS + x 4.  Extremities: No clubbing, cyanosis or edema. DP/PT/Radials 2+ and equal bilaterally.  Labs  Lab Results  Component Value Date   WBC 9.5 06/28/2012   HGB 13.1 06/28/2012   HCT 38.2 06/28/2012   MCV 99.0 06/28/2012   PLT 224 06/28/2012     Recent Labs Lab 06/28/12 1102  NA 130*  K 3.6  CL 88*  CO2 30  BUN 52*  CREATININE 2.45*  CALCIUM 9.8  GLUCOSE 108*    Radiology/Studies  Dg Hip Complete Left  06/28/2012   *RADIOLOGY REPORT*  Clinical Data: Larey Seat today, left hip pain.  LEFT HIP - COMPLETE 2+ VIEW  Comparison: None.  Findings: Intertrochanteric fracture of the left hip with slight foreshortening. A component of the fracture extends into the proximal femur/subtrochanteric region towards the medial proximal femur.  No visible pelvic fractures.  IMPRESSION: Intertrochanteric fracture left hip as  described.   Original Report Authenticated By: Davonna Belling, M.D.   Dg Chest Port 1 View  06/28/2012   *RADIOLOGY REPORT*  Clinical Data: Fall, hip fracture, preop  PORTABLE CHEST - 1 VIEW  Comparison: 11/20/2011  Findings: Cardiomediastinal silhouette is stable.  No acute infiltrate or pleural effusion.  No pulmonary edema.  Stable chronic mild interstitial prominence.  IMPRESSION: No active disease.  No significant change.   Original Report Authenticated By: Natasha Mead, M.D.   ECG  Sb, 58, 1st deg avb, rbbb.  ASSESSMENT AND PLAN  1.  Left intertrochanteric hip fracture:  In setting of mechanical fall.  Ortho consult pending.  Prior to fall, she was reasonably active without chest pain or significant dyspnea.  We will not pursue any additional ischemic evaluation prior to surgery.  2.  PAF:  In sinus.  Rec tele and continuation of bb throughout perioperative period.  Coumadin on hold - resume when feasible post-op.  3.  Chronic Diastolic CHF:  Volume stable.  HR/BP stable.  Follow periop.  4.  ESRD:  S/p Left AVF.  She is not on dialysis.  Creat appears to be stable.   Signed, Nicolasa Ducking, NP 06/28/2012, 1:48 PM   Patient seen and examined.  Agree with findings of C Berge above. Briefly, 79 yo with history of PAF  Now with hip fx after fall.  No symptoms of angina or CHF prior  Has been active On exam Lungs CTA  Neck:  JVP normal  Cardiac exam:  RRR.  No S3  Ext  Tr edema.. Would proceed with surgery without furhter cardiac testing  Keep on  telemetry  [facility] is off coumadin.  Resume when OK from surgical standpoint.

## 2012-06-29 DIAGNOSIS — N186 End stage renal disease: Secondary | ICD-10-CM

## 2012-06-29 DIAGNOSIS — D649 Anemia, unspecified: Secondary | ICD-10-CM

## 2012-06-29 DIAGNOSIS — K5900 Constipation, unspecified: Secondary | ICD-10-CM

## 2012-06-29 LAB — BASIC METABOLIC PANEL
BUN: 48 mg/dL — ABNORMAL HIGH (ref 6–23)
Calcium: 9.3 mg/dL (ref 8.4–10.5)
Creatinine, Ser: 2.14 mg/dL — ABNORMAL HIGH (ref 0.50–1.10)
GFR calc Af Amer: 23 mL/min — ABNORMAL LOW (ref >90)

## 2012-06-29 LAB — CBC
HCT: 35.2 % — ABNORMAL LOW (ref 36.0–46.0)
MCH: 33.8 pg (ref 26.0–34.0)
MCV: 99.2 fL (ref 78.0–100.0)
Platelets: 204 10*3/uL (ref 150–400)
RDW: 16.6 % — ABNORMAL HIGH (ref 11.5–15.5)

## 2012-06-29 LAB — PROTIME-INR: Prothrombin Time: 30.4 seconds — ABNORMAL HIGH (ref 11.6–15.2)

## 2012-06-29 MED ORDER — HYDROMORPHONE HCL PF 1 MG/ML IJ SOLN
0.5000 mg | INTRAMUSCULAR | Status: DC | PRN
  Administered 2012-06-29: 0.5 mg via INTRAVENOUS
  Filled 2012-06-29: qty 1

## 2012-06-29 MED ORDER — HYDROCODONE-ACETAMINOPHEN 5-325 MG PO TABS
1.0000 | ORAL_TABLET | ORAL | Status: DC | PRN
  Administered 2012-06-29 – 2012-07-03 (×8): 1 via ORAL
  Filled 2012-06-29 (×8): qty 1

## 2012-06-29 MED ORDER — PHYTONADIONE 5 MG PO TABS
5.0000 mg | ORAL_TABLET | Freq: Once | ORAL | Status: AC
  Administered 2012-06-29: 5 mg via ORAL
  Filled 2012-06-29: qty 1

## 2012-06-29 MED ORDER — DIPHENHYDRAMINE HCL 50 MG/ML IJ SOLN
12.5000 mg | Freq: Three times a day (TID) | INTRAMUSCULAR | Status: DC | PRN
  Administered 2012-06-29 – 2012-06-30 (×2): 12.5 mg via INTRAVENOUS
  Filled 2012-06-29 (×2): qty 1

## 2012-06-29 MED ORDER — WHITE PETROLATUM GEL
Status: AC
  Administered 2012-06-29: 03:00:00
  Filled 2012-06-29: qty 5

## 2012-06-29 NOTE — Progress Notes (Signed)
SUBJECTIVE:  Itching.  No chest pain.  No SOB   PHYSICAL EXAM Filed Vitals:   06/28/12 2006 06/29/12 0000 06/29/12 0400 06/29/12 0605  BP: 110/53   120/57  Pulse: 67   65  Temp: 98.4 F (36.9 C)   98.6 F (37 C)  TempSrc: Oral   Oral  Resp: 18 19 17 18   Height:      Weight:      SpO2: 97% 98% 97% 98%   General:  No distress Lungs:  clear Heart:  RRR Abdomen:  Positive bowel sounds, no rebound no guarding Extremities:  No edema  LABS: Lab Results  Component Value Date   TROPONINI <0.30 06/28/2012   Results for orders placed during the hospital encounter of 06/28/12 (from the past 24 hour(s))  TYPE AND SCREEN     Status: None   Collection Time    06/28/12 10:15 AM      Result Value Range   ABO/RH(D) O POS     Antibody Screen NEG     Sample Expiration 07/01/2012    BASIC METABOLIC PANEL     Status: Abnormal   Collection Time    06/28/12 11:02 AM      Result Value Range   Sodium 130 (*) 135 - 145 mEq/L   Potassium 3.6  3.5 - 5.1 mEq/L   Chloride 88 (*) 96 - 112 mEq/L   CO2 30  19 - 32 mEq/L   Glucose, Bld 108 (*) 70 - 99 mg/dL   BUN 52 (*) 6 - 23 mg/dL   Creatinine, Ser 1.61 (*) 0.50 - 1.10 mg/dL   Calcium 9.8  8.4 - 09.6 mg/dL   GFR calc non Af Amer 17 (*) >90 mL/min   GFR calc Af Amer 20 (*) >90 mL/min  CBC WITH DIFFERENTIAL     Status: Abnormal   Collection Time    06/28/12 11:02 AM      Result Value Range   WBC 9.5  4.0 - 10.5 K/uL   RBC 3.86 (*) 3.87 - 5.11 MIL/uL   Hemoglobin 13.1  12.0 - 15.0 g/dL   HCT 04.5  40.9 - 81.1 %   MCV 99.0  78.0 - 100.0 fL   MCH 33.9  26.0 - 34.0 pg   MCHC 34.3  30.0 - 36.0 g/dL   RDW 91.4 (*) 78.2 - 95.6 %   Platelets 224  150 - 400 K/uL   Neutrophils Relative % 81 (*) 43 - 77 %   Neutro Abs 7.7  1.7 - 7.7 K/uL   Lymphocytes Relative 9 (*) 12 - 46 %   Lymphs Abs 0.9  0.7 - 4.0 K/uL   Monocytes Relative 9  3 - 12 %   Monocytes Absolute 0.8  0.1 - 1.0 K/uL   Eosinophils Relative 1  0 - 5 %   Eosinophils Absolute 0.1   0.0 - 0.7 K/uL   Basophils Relative 0  0 - 1 %   Basophils Absolute 0.0  0.0 - 0.1 K/uL  PROTIME-INR     Status: Abnormal   Collection Time    06/28/12 11:02 AM      Result Value Range   Prothrombin Time 27.8 (*) 11.6 - 15.2 seconds   INR 2.76 (*) 0.00 - 1.49  TROPONIN I     Status: None   Collection Time    06/28/12  2:01 PM      Result Value Range   Troponin I <0.30  <0.30 ng/mL  CBC     Status: Abnormal   Collection Time    06/29/12  4:20 AM      Result Value Range   WBC 10.2  4.0 - 10.5 K/uL   RBC 3.55 (*) 3.87 - 5.11 MIL/uL   Hemoglobin 12.0  12.0 - 15.0 g/dL   HCT 56.2 (*) 13.0 - 86.5 %   MCV 99.2  78.0 - 100.0 fL   MCH 33.8  26.0 - 34.0 pg   MCHC 34.1  30.0 - 36.0 g/dL   RDW 78.4 (*) 69.6 - 29.5 %   Platelets 204  150 - 400 K/uL  BASIC METABOLIC PANEL     Status: Abnormal   Collection Time    06/29/12  4:20 AM      Result Value Range   Sodium 130 (*) 135 - 145 mEq/L   Potassium 4.7  3.5 - 5.1 mEq/L   Chloride 91 (*) 96 - 112 mEq/L   CO2 28  19 - 32 mEq/L   Glucose, Bld 103 (*) 70 - 99 mg/dL   BUN 48 (*) 6 - 23 mg/dL   Creatinine, Ser 2.84 (*) 0.50 - 1.10 mg/dL   Calcium 9.3  8.4 - 13.2 mg/dL   GFR calc non Af Amer 20 (*) >90 mL/min   GFR calc Af Amer 23 (*) >90 mL/min    Intake/Output Summary (Last 24 hours) at 06/29/12 0953 Last data filed at 06/29/12 0606  Gross per 24 hour  Intake    240 ml  Output    300 ml  Net    -60 ml    ASSESSMENT AND PLAN:  Intertrochanteric fracture of left hip:  No further cardiac work up needed prior to OR.   Atrial fibrillation:  OK to hold warfarin.  Follow on telemetry post op.  HYPERTENSION, BENIGN:  BP OK.  Chronic diastolic CHF (congestive heart failure):  Seems to be euvolemic.  No change in therapy.       Fayrene Fearing [facility] 06/29/2012 9:53 AM

## 2012-06-29 NOTE — Progress Notes (Signed)
Subjective: The patient was not evaluated personally today as we saw that the INR was elevated would not be able to perform surgery.   Objective: Vital signs in last 24 hours: Temp:  [98.4 F (36.9 C)-98.8 F (37.1 C)] 98.8 F (37.1 C) (06/10 1359) Pulse Rate:  [65-67] 65 (06/10 1359) Resp:  [16-19] 16 (06/10 1359) BP: (110-120)/(40-57) 115/40 mmHg (06/10 1359) SpO2:  [69 %-98 %] 94 % (06/10 1359)  Intake/Output from previous day: 06/09 0701 - 06/10 0700 In: 240 [P.O.:240] Out: 300 [Urine:300] Intake/Output this shift: Total I/O In: 240 [P.O.:240] Out: 900 [Urine:900]   Recent Labs  06/28/12 1102 06/29/12 0420  HGB 13.1 12.0    Recent Labs  06/28/12 1102 06/29/12 0420  WBC 9.5 10.2  RBC 3.86* 3.55*  HCT 38.2 35.2*  PLT 224 204    Recent Labs  06/28/12 1102 06/29/12 0420  NA 130* 130*  K 3.6 4.7  CL 88* 91*  CO2 30 28  BUN 52* 48*  CREATININE 2.45* 2.14*  GLUCOSE 108* 103*  CALCIUM 9.8 9.3    Recent Labs  06/28/12 1102 06/29/12 1041  INR 2.76* 3.12*    no exam performed today.  Assessment/Plan: Intertrochanteric hip fracture left with elevated INR unsuitable for surgical intervention today.//Plan a discussion with her medical doctor but at this point I think that we need to give her some sort of reversal age and as the risk of going to long becomes real.  They have chosen to give her vitamin K but are aware of this testing her early so that they might want to consider FFP if she still hasn't greatly elevated INR.  I would certainly be fine with doing her surgery with an INR 2.5 or less.  I discussed this today at length with her medical Dr.   Harvie Junior 06/29/2012, 4:06 PM

## 2012-06-29 NOTE — Progress Notes (Signed)
ANTICOAGULATION CONSULT NOTE - Initial Consult  Pharmacy Consult for Heparin Indication: Hx Afib/PE  Allergies  Allergen Reactions  . Codeine Other (See Comments)    unknown  . Sulfa Antibiotics     unknown    Patient Measurements: Height: 5\' 2"  (157.5 cm) Weight: 128 lb (58.06 kg) IBW/kg (Calculated) : 50.1 Heparin Dosing Weight: 58.1kg  Vital Signs: Temp: 98.6 F (37 C) (06/10 0605) Temp src: Oral (06/10 0605) BP: 120/57 mmHg (06/10 0605) Pulse Rate: 65 (06/10 0605)  Labs:  Recent Labs  06/28/12 1102 06/28/12 1401 06/29/12 0420 06/29/12 1041  HGB 13.1  --  12.0  --   HCT 38.2  --  35.2*  --   PLT 224  --  204  --   LABPROT 27.8*  --   --  30.4*  INR 2.76*  --   --  3.12*  CREATININE 2.45*  --  2.14*  --   TROPONINI  --  <0.30  --   --     Estimated Creatinine Clearance: 15.2 ml/min (by C-G formula based on Cr of 2.14).   Medical History: Past Medical History  Diagnosis Date  . HTN (hypertension)     2 YEARS  . Hypothyroidism   . Temporal arteritis   . Pulmonary embolism 1997  . Mitral regurgitation     a. mild by 01/2010 echo.  . Chronic diastolic CHF (congestive heart failure)     a. 01/2010 Echo: EF 60%, mild LVH, High LV filling pressures, No RWMA, mild MR, PASP .  Arman Bogus   . Chronic anemia   . Paroxysmal atrial fibrillation   . Pulmonary HTN     a. PASP by 01/2010 echo.  . S/P cardiac cath     a. 12/2009 R Heart Cath:  RA 12, RV 45/11, PA 47/21, PCWP MEAN 26, CO 4.56, CI 2.7.  . Ischemia     a. 12/2009 Myoview: Ant ischemia, EF 75%----NO CATH DONE DUE TO CKD  . End stage renal disease     a. s/p AVF - not on dialysis.  [name] History of urinary frequency   . GERD (gastroesophageal reflux disease)   . Sleep trouble   . Constipation, chronic   . Hard of hearing   . Complication of anesthesia   . PONV (postoperative nausea and vomiting)     Medications:  Prescriptions prior to admission  Medication Sig Dispense Refill  .  amLODipine (NORVASC) 5 MG tablet Take 5 mg by mouth daily.        . calcitRIOL (ROCALTROL) 0.25 MCG capsule Take 0.25 mcg by mouth daily.      . Cholecalciferol (VITAMIN D) 400 UNITS capsule Take 800 Units by mouth daily.       . furosemide (LASIX) 40 MG tablet Take 40 mg by mouth daily.      [name] levothyroxine (SYNTHROID, LEVOTHROID) 75 MCG tablet Take 75 mcg by mouth daily.        . Melatonin 5 MG TABS Take 5 mg by mouth at bedtime.       . Multiple Vitamin (MULTIVITAMIN WITH MINERALS) TABS Take 1 tablet by mouth daily. Centrum Silver      . omeprazole (PRILOSEC) 20 MG capsule Take 20 mg by mouth daily.        [name] oxybutynin (DITROPAN-XL) 10 MG 24 hr tablet Take 10 mg by mouth daily.      . polyethylene glycol (MIRALAX / GLYCOLAX) packet Take 17 g by mouth daily.      [name]  sertraline (ZOLOFT) 25 MG tablet Take 25 mg by mouth daily.      [name] warfarin (COUMADIN) 3 MG tablet Take 3 mg by mouth See admin instructions. Take 1 tablet on Sun, Tues, Wed, Thurs, Sat      . warfarin (COUMADIN) 4 MG tablet Take 4 mg by mouth 2 (two) times a week. Take 1 tablet on Mon and Fri        Assessment: 87yof on Coumadin PTA for hx Afib/PE to start heparin drip while Coumadin is on hold for ortho procedure. Current INR is supratherapeutic at 3.12 - Vitamin K 5mg  PO has been ordered to facilitate INR reversal. Per discussion with Dr. Gwenlyn Perking, start heparin drip once INR < 2 - will check INR with 6/11 AM labs Rip Harbour with MD).  -  H/H and Plts wnl - No significant bleeding reported - Heparin weight: 58.1 kg  Goal of Therapy:  Heparin level 0.3-0.7 units/ml Monitor platelets by anticoagulation protocol: Yes   Plan:  1. INR with 6/11 AM labs - heparin drip if <2  Cleon Dew [phone] 06/29/2012,12:09 PM

## 2012-06-29 NOTE — Progress Notes (Signed)
Orthopedic Tech Progress Note Patient Details:  Cheryl [name] [birth year] [id number]  Patient ID: [name], female   DOB: 1925/11/03, 79 y.o.   MRN: [id number]   Cheryl [name] 06/29/2012, 12:28 PMTrapeze bar

## 2012-06-29 NOTE — Consult Note (Signed)
Reason for Consult:left hip fracture Referring Physician: hospitalist  Cheryl [name] is an 79 y.o. female.  HPI: the patient is an 79 year old female who fell earlier in the day.  She is admitted to the medical service and we are consult in for an obvious intratrochanteric hip fracture on the left side.  The patient has significant medical problems and is on Coumadin chronically.  We are consulted for management of her left hip fracture.  The patient denies numbness tingling or radiating pain on the left side.  She is with her 2 daughters and she is unable to give concise history.  Past Medical History  Diagnosis Date  . HTN (hypertension)     2 YEARS  . Hypothyroidism   . Temporal arteritis   . Pulmonary embolism 1997  . Mitral regurgitation     a. mild by 01/2010 echo.  . Chronic diastolic CHF (congestive heart failure)     a. 01/2010 Echo: EF 60%, mild LVH, High LV filling pressures, No RWMA, mild MR, PASP .  Arman Bogus   . Chronic anemia   . Paroxysmal atrial fibrillation   . Pulmonary HTN     a. PASP by 01/2010 echo.  . S/P cardiac cath     a. 12/2009 R Heart Cath:  RA 12, RV 45/11, PA 47/21, PCWP MEAN 26, CO 4.56, CI 2.7.  . Ischemia     a. 12/2009 Myoview: Ant ischemia, EF 75%----NO CATH DONE DUE TO CKD  . End stage renal disease     a. s/p AVF - not on dialysis.  [name] History of urinary frequency   . GERD (gastroesophageal reflux disease)   . Sleep trouble   . Constipation, chronic   . Hard of hearing   . Complication of anesthesia   . PONV (postoperative nausea and vomiting)     Past Surgical History  Procedure Laterality Date  . Temporal arteny bx    . Cataract surgery    . Eye surgery    . Abdominal hysterectomy      Family History  Problem Relation Age of Onset  . Heart attack Mother 28  . Stroke Father 8  . Hypertension Sister   . Other      THERE IS NO EARLY HEART DISEASE  . Cancer Brother     prostate  . Diabetes Daughter   .  Hyperlipidemia Daughter   . Hypertension Daughter     Social History:  reports that she has never smoked. She has never used smokeless tobacco. She reports that she does not drink alcohol or use illicit drugs.  Allergies:  Allergies  Allergen Reactions  . Codeine Other (See Comments)    unknown  . Sulfa Antibiotics     unknown    Medications: I have reviewed the patient's current medications.  Results for orders placed during the hospital encounter of 06/28/12 (from the past 48 hour(s))  TYPE AND SCREEN     Status: None   Collection Time    06/28/12 10:15 AM      Result Value Range   ABO/RH(D) O POS     Antibody Screen NEG     Sample Expiration 07/01/2012    BASIC METABOLIC PANEL     Status: Abnormal   Collection Time    06/28/12 11:02 AM      Result Value Range   Sodium 130 (*) 135 - 145 mEq/L   Potassium 3.6  3.5 - 5.1 mEq/L   Chloride 88 (*) 96 -  112 mEq/L   CO2 30  19 - 32 mEq/L   Glucose, Bld 108 (*) 70 - 99 mg/dL   BUN 52 (*) 6 - 23 mg/dL   Creatinine, Ser 4.09 (*) 0.50 - 1.10 mg/dL   Calcium 9.8  8.4 - 81.1 mg/dL   GFR calc non Af Amer 17 (*) >90 mL/min   GFR calc Af Amer 20 (*) >90 mL/min   Comment:            The eGFR has been calculated     using the CKD EPI equation.     This calculation has not been     validated in all clinical     situations.     eGFR's persistently     <90 mL/min signify     possible Chronic Kidney Disease.  CBC WITH DIFFERENTIAL     Status: Abnormal   Collection Time    06/28/12 11:02 AM      Result Value Range   WBC 9.5  4.0 - 10.5 K/uL   RBC 3.86 (*) 3.87 - 5.11 MIL/uL   Hemoglobin 13.1  12.0 - 15.0 g/dL   HCT 91.4  78.2 - 95.6 %   MCV 99.0  78.0 - 100.0 fL   MCH 33.9  26.0 - 34.0 pg   MCHC 34.3  30.0 - 36.0 g/dL   RDW 21.3 (*) 08.6 - 57.8 %   Platelets 224  150 - 400 K/uL   Neutrophils Relative % 81 (*) 43 - 77 %   Neutro Abs 7.7  1.7 - 7.7 K/uL   Lymphocytes Relative 9 (*) 12 - 46 %   Lymphs Abs 0.9  0.7 - 4.0 K/uL    Monocytes Relative 9  3 - 12 %   Monocytes Absolute 0.8  0.1 - 1.0 K/uL   Eosinophils Relative 1  0 - 5 %   Eosinophils Absolute 0.1  0.0 - 0.7 K/uL   Basophils Relative 0  0 - 1 %   Basophils Absolute 0.0  0.0 - 0.1 K/uL  PROTIME-INR     Status: Abnormal   Collection Time    06/28/12 11:02 AM      Result Value Range   Prothrombin Time 27.8 (*) 11.6 - 15.2 seconds   INR 2.76 (*) 0.00 - 1.49  TROPONIN I     Status: None   Collection Time    06/28/12  2:01 PM      Result Value Range   Troponin I <0.30  <0.30 ng/mL   Comment:            Due to the release kinetics of cTnI,     a negative result within the first hours     of the onset of symptoms does not rule out     myocardial infarction with certainty.     If myocardial infarction is still suspected,     repeat the test at appropriate intervals.    Dg Hip Complete Left  06/28/2012   *RADIOLOGY REPORT*  Clinical Data: Larey Seat today, left hip pain.  LEFT HIP - COMPLETE 2+ VIEW  Comparison: None.  Findings: Intertrochanteric fracture of the left hip with slight foreshortening. A component of the fracture extends into the proximal femur/subtrochanteric region towards the medial proximal femur.  No visible pelvic fractures.  IMPRESSION: Intertrochanteric fracture left hip as described.   Original Report Authenticated By: Davonna Belling, M.D.   Dg Chest Port 1 View  06/28/2012   *RADIOLOGY REPORT*  Clinical  Data: Fall, hip fracture, preop  PORTABLE CHEST - 1 VIEW  Comparison: 11/20/2011  Findings: Cardiomediastinal silhouette is stable.  No acute infiltrate or pleural effusion.  No pulmonary edema.  Stable chronic mild interstitial prominence.  IMPRESSION: No active disease.  No significant change.   Original Report Authenticated By: Natasha Mead, M.D.    ROS: I have reviewed the patient's review of systems thoroughly and there are no positive responses as relates to the HPI. Exam:  Blood pressure 110/53, pulse 67, temperature 98.4 F (36.9 C),  temperature source Oral, resp. rate 18, height 5\' 2"  (1.575 m), weight 58.06 kg (128 lb), SpO2 97.00%. Well-developed well-nourished patient in no acute distress. Alert and oriented x3 HEENT:within normal limits Cardiac: Regular rate and rhythm Pulmonary: Lungs clear to auscultation Abdomen: Soft and nontender.  Normal active bowel sounds  Musculoskeletal: left leg externally rotated and shortened.  2+ distal pulse.  Pain with all range of motion.  Assessment/Plan: 79 year old female with multiple medical problems who is currently on anticoagulation with an INR of 2.7.  She has a left intertrochanteric hip fracture which will need intramedullary fixation with a screw up into the head.  She will be admitted to the medical service and we will follow her awaiting her INR to drop to an acceptable level.  Once this has been achieved I will plan on fixing her left hip.  I discussed this plan with her daughters and with the patient.I have had a prolonged discussion with the patient regarding the risk and benefits of the surgical procedure.  The patient understands the risks include but are not limited to bleeding, infection and failure of the surgery to cure the problem and need for further surgery.  The patient understands there is a slight risk of death at the time of surgery.  The patient understands these risks along with the potential benefits and wishes to proceed with surgical intervention.  The patient will be followed in the office in the postoperative period.it will likely be Wednesday before her INR is at an acceptable level.  Renold Kozar L 06/29/2012, 1:32 AM

## 2012-06-29 NOTE — Progress Notes (Signed)
UR COMPLETED  

## 2012-06-29 NOTE — Progress Notes (Signed)
[facility] PROGRESS NOTE  ILAH BOULE ZOX:[id number] DOB: 11-30-1925 DOA: 06/28/2012 PCP: Abigail Miyamoto, MD  Assessment/Plan: L Hip fracture  - mechanical fall, with no hx of syncope  - Dr. Luiz Blare (orthopedics) is consulted and planning surgery for tomorrow  - cont hip fx protocol  - will reverse coumadin and use heparin per pharmacy in anticipation for surgery  Dehydration  - IVF's changed to [facility] after gentle resuscitation - will continue holding lasix  - monitor for volume overload given pts hx of CHF   [name] with history of DVT/PE  - chronic coumadin being held and reversed today with Vit K - heparin per pharmacy - pt appears stable  - Cardiology consulted and no further work up prior to sx needed  ESRD  - Not on dialysis - graft in L arm  - Baseline Creatinine ~2  - Follows with Dr. Allena Katz  -Cr stable. Will monitor  Diastolic CHF  - Echo 01/2010 showed LVEF 60% and mitral regurgitation  - Cardiology consulted and no further rec's - IV fluids administered for dehydration - will monitor closely   Chronic Hyponatremia  - Appears chronic due to ESRD and diuretics - at baseline sodium 130  - will monitor  Chronic Anemia  - Appears chronic, baseline Hgb 11   Chronic Constipation  - Continue miralax and colace as needed  Code Status: Full Family Communication: no family at bedside Disposition Plan: most likely SNF at discharge for rehab   Consultants:  Dr. Luiz Blare (ortjopedic service)  [facility]ology  Procedures:  Left hip ORIF potentially on 6/11  Antibiotics:  none  HPI/Subjective: Afebrile, no CP, no SOB. Complaining of pain on her left hip  Objective: Filed Vitals:   06/29/12 0000 06/29/12 0400 06/29/12 0605 06/29/12 0800  BP:   120/57   Pulse:   65   Temp:   98.6 F (37 C)   TempSrc:   Oral   Resp: 19 17 18 16   Height:      Weight:      SpO2: 98% 97% 98% 98%    Intake/Output Summary (Last 24 hours) at 06/29/12  1152 Last data filed at 06/29/12 0606  Gross per 24 hour  Intake    240 ml  Output    300 ml  Net    -60 ml   Filed Weights   06/28/12 1300  Weight: 58.06 kg (128 lb)    Exam:   General:  NAD, no CP or SOB  Cardiovascular: S1 and S2, no rubs or gallops  Respiratory: CTA bilaterally  Abdomen: soft, NT, ND, positive BS  Musculoskeletal: decrease range of motion on her left leg due to pain on hip area; trace edema bilaterally.  Data Reviewed: Basic Metabolic Panel:  Recent Labs Lab 06/28/12 1102 06/29/12 0420  NA 130* 130*  K 3.6 4.7  CL 88* 91*  CO2 30 28  GLUCOSE 108* 103*  BUN 52* 48*  CREATININE 2.45* 2.14*  CALCIUM 9.8 9.3   CBC:  Recent Labs Lab 06/24/12 1457 06/28/12 1102 06/29/12 0420  WBC  --  9.5 10.2  NEUTROABS  --  7.7  --   HGB 10.6* 13.1 12.0  HCT  --  38.2 35.2*  MCV  --  99.0 99.2  PLT  --  224 204   Cardiac Enzymes:  Recent Labs Lab 06/28/12 1401  TROPONINI <0.30     Studies: Dg Hip Complete Left  06/28/2012   *RADIOLOGY REPORT*  Clinical Data: Larey Seat today, left  hip pain.  LEFT HIP - COMPLETE 2+ VIEW  Comparison: None.  Findings: Intertrochanteric fracture of the left hip with slight foreshortening. A component of the fracture extends into the proximal femur/subtrochanteric region towards the medial proximal femur.  No visible pelvic fractures.  IMPRESSION: Intertrochanteric fracture left hip as described.   Original Report Authenticated By: Davonna Belling, M.D.   Dg Chest Port 1 View  06/28/2012   *RADIOLOGY REPORT*  Clinical Data: Fall, hip fracture, preop  PORTABLE CHEST - 1 VIEW  Comparison: 11/20/2011  Findings: Cardiomediastinal silhouette is stable.  No acute infiltrate or pleural effusion.  No pulmonary edema.  Stable chronic mild interstitial prominence.  IMPRESSION: No active disease.  No significant change.   Original Report Authenticated By: Natasha Mead, M.D.    Scheduled Meds: . amLODipine  5 mg Oral Daily  . calcitRIOL   0.25 mcg Oral Daily  . cholecalciferol  800 Units Oral Daily  . docusate sodium  100 mg Oral BID  . levothyroxine  75 mcg Oral Daily  . metoprolol tartrate  100 mg Oral BID  . multivitamin with minerals  1 tablet Oral Daily  . oxybutynin  10 mg Oral Daily  . pantoprazole  40 mg Oral Daily  . phytonadione  5 mg Oral Once  . polyethylene glycol  17 g Oral Daily  . sertraline  25 mg Oral Daily   Continuous Infusions: . sodium chloride 0.9 % 1,000 mL with potassium chloride 10 mEq infusion 50 mL/hr at 06/28/12 1810    Principal Problem:   Intertrochanteric fracture of left hip Active Problems:   Atrial fibrillation   HYPERTENSION, BENIGN   PULMONARY HYPERTENSION   End stage renal disease   Hyponatremia   Anemia   Chronic diastolic CHF (congestive heart failure)   Constipation, chronic    Time spent: >30 minutes   Traveion Ruddock  [facility] Pager [phone]. If 7PM-7AM, please contact night-coverage at [email or web address], password [facility] 06/29/2012, 11:52 AM  LOS: 1 day

## 2012-06-29 NOTE — Progress Notes (Signed)
PT Cancellation Note  Patient Details Name: Cheryl [name] MRN: [id number] DOB: [birth year]   Cancelled Treatment:    Reason Eval/Treat Not Completed: Patient not medically ready.  PT ordered however order is written for after ORIF planned for Wednesday (06/30/12).  Will plan for evaluation to be performed on 07/01/12.  Thanks!!   Yohannes Waibel 06/29/2012, 10:03 AM Jake Shark, PT DPT [phone]

## 2012-06-30 ENCOUNTER — Inpatient Hospital Stay (HOSPITAL_COMMUNITY): Payer: Medicare Other

## 2012-06-30 ENCOUNTER — Inpatient Hospital Stay (HOSPITAL_COMMUNITY): Payer: Medicare Other | Admitting: Anesthesiology

## 2012-06-30 ENCOUNTER — Encounter (HOSPITAL_COMMUNITY): Payer: Self-pay | Admitting: Anesthesiology

## 2012-06-30 ENCOUNTER — Encounter (HOSPITAL_COMMUNITY)
Admission: EM | Disposition: A | Discharge status: Skilled Nursing Facility | Payer: Self-pay | Source: Home / Self Care | Attending: Internal Medicine

## 2012-06-30 HISTORY — PX: INJECTION KNEE: SHX2446

## 2012-06-30 HISTORY — PX: INTRAMEDULLARY (IM) NAIL INTERTROCHANTERIC: SHX5875

## 2012-06-30 LAB — URINALYSIS, ROUTINE W REFLEX MICROSCOPIC
Bilirubin Urine: NEGATIVE
Glucose, UA: NEGATIVE mg/dL
Specific Gravity, Urine: 1.015 (ref 1.005–1.030)
Urobilinogen, UA: 0.2 mg/dL (ref 0.0–1.0)

## 2012-06-30 LAB — CBC
Hemoglobin: 10.9 g/dL — ABNORMAL LOW (ref 12.0–15.0)
MCH: 34.5 pg — ABNORMAL HIGH (ref 26.0–34.0)
MCHC: 34.5 g/dL (ref 30.0–36.0)
MCV: 100 fL (ref 78.0–100.0)
Platelets: 187 10*3/uL (ref 150–400)

## 2012-06-30 LAB — URINE MICROSCOPIC-ADD ON

## 2012-06-30 LAB — PROTIME-INR: Prothrombin Time: 27.3 seconds — ABNORMAL HIGH (ref 11.6–15.2)

## 2012-06-30 SURGERY — FIXATION, FRACTURE, INTERTROCHANTERIC, WITH INTRAMEDULLARY ROD
Anesthesia: General | Site: Knee | Laterality: Left | Wound class: Clean

## 2012-06-30 MED ORDER — SODIUM CHLORIDE 0.9 % IV SOLN
INTRAVENOUS | Status: DC
  Administered 2012-06-30: 10:00:00 via INTRAVENOUS

## 2012-06-30 MED ORDER — BUPIVACAINE HCL (PF) 0.5 % IJ SOLN
INTRAMUSCULAR | Status: AC
  Filled 2012-06-30: qty 30

## 2012-06-30 MED ORDER — LIDOCAINE HCL (CARDIAC) 20 MG/ML IV SOLN
INTRAVENOUS | Status: DC | PRN
  Administered 2012-06-30: 100 mg via INTRAVENOUS

## 2012-06-30 MED ORDER — ACETAMINOPHEN 325 MG PO TABS: 650.0000 mg | ORAL_TABLET | Freq: Four times a day (QID) | ORAL | Status: DC | PRN

## 2012-06-30 MED ORDER — CEFAZOLIN SODIUM-DEXTROSE 2-3 GM-% IV SOLR
INTRAVENOUS | Status: DC | PRN
  Administered 2012-06-30: 2 g via INTRAVENOUS

## 2012-06-30 MED ORDER — METOPROLOL TARTRATE 100 MG PO TABS: 100.0000 mg | ORAL_TABLET | Freq: Two times a day (BID) | ORAL | Status: DC

## 2012-06-30 MED ORDER — WARFARIN SODIUM 4 MG PO TABS
4.0000 mg | ORAL_TABLET | Freq: Once | ORAL | Status: AC
  Administered 2012-06-30: 4 mg via ORAL
  Filled 2012-06-30: qty 1

## 2012-06-30 MED ORDER — 0.9 % SODIUM CHLORIDE (POUR BTL) OPTIME
TOPICAL | Status: DC | PRN
  Administered 2012-06-30: 1000 mL

## 2012-06-30 MED ORDER — ARTIFICIAL TEARS OP OINT
TOPICAL_OINTMENT | OPHTHALMIC | Status: DC | PRN
  Administered 2012-06-30: 1 via OPHTHALMIC

## 2012-06-30 MED ORDER — ONDANSETRON HCL 4 MG/2ML IJ SOLN: 4.0000 mg | Freq: Four times a day (QID) | INTRAMUSCULAR | Status: DC | PRN

## 2012-06-30 MED ORDER — BUPIVACAINE HCL (PF) 0.5 % IJ SOLN
INTRAMUSCULAR | Status: DC | PRN
  Administered 2012-06-30: 20 mL via INTRA_ARTICULAR

## 2012-06-30 MED ORDER — FENTANYL CITRATE 0.05 MG/ML IJ SOLN
INTRAMUSCULAR | Status: DC | PRN
  Administered 2012-06-30 (×2): 50 ug via INTRAVENOUS

## 2012-06-30 MED ORDER — METHOCARBAMOL 500 MG PO TABS: 500.0000 mg | ORAL_TABLET | Freq: Four times a day (QID) | ORAL | Status: DC | PRN

## 2012-06-30 MED ORDER — GLYCOPYRROLATE 0.2 MG/ML IJ SOLN
INTRAMUSCULAR | Status: DC | PRN
  Administered 2012-06-30: .6 mg via INTRAVENOUS

## 2012-06-30 MED ORDER — ONDANSETRON HCL 4 MG/2ML IJ SOLN
INTRAMUSCULAR | Status: DC | PRN
  Administered 2012-06-30: 4 mg via INTRAVENOUS

## 2012-06-30 MED ORDER — WARFARIN - PHARMACIST DOSING INPATIENT: Freq: Every day | Status: DC

## 2012-06-30 MED ORDER — ALUM & MAG HYDROXIDE-SIMETH 200-200-20 MG/5ML PO SUSP
30.0000 mL | ORAL | Status: DC | PRN
  Administered 2012-06-30 – 2012-07-01 (×2): 30 mL via ORAL
  Filled 2012-06-30 (×2): qty 30

## 2012-06-30 MED ORDER — DEXTROSE 5 % IV SOLN
500.0000 mg | Freq: Four times a day (QID) | INTRAVENOUS | Status: DC | PRN
  Filled 2012-06-30: qty 5

## 2012-06-30 MED ORDER — METHYLPREDNISOLONE ACETATE 80 MG/ML IJ SUSP
INTRAMUSCULAR | Status: AC
  Filled 2012-06-30: qty 2

## 2012-06-30 MED ORDER — FERROUS SULFATE 325 (65 FE) MG PO TABS
325.0000 mg | ORAL_TABLET | Freq: Two times a day (BID) | ORAL | Status: DC
  Administered 2012-06-30 – 2012-07-03 (×7): 325 mg via ORAL
  Filled 2012-06-30 (×8): qty 1

## 2012-06-30 MED ORDER — NEOSTIGMINE METHYLSULFATE 1 MG/ML IJ SOLN
INTRAMUSCULAR | Status: DC | PRN
  Administered 2012-06-30: 4 mg via INTRAVENOUS

## 2012-06-30 MED ORDER — ONDANSETRON HCL 4 MG PO TABS: 4.0000 mg | ORAL_TABLET | Freq: Four times a day (QID) | ORAL | Status: DC | PRN

## 2012-06-30 MED ORDER — CEFAZOLIN SODIUM-DEXTROSE 2-3 GM-% IV SOLR
2.0000 g | Freq: Four times a day (QID) | INTRAVENOUS | Status: AC
  Administered 2012-06-30 (×2): 2 g via INTRAVENOUS
  Filled 2012-06-30 (×2): qty 50

## 2012-06-30 MED ORDER — HYDROCODONE-ACETAMINOPHEN 5-325 MG PO TABS: 1.0000 | ORAL_TABLET | Freq: Four times a day (QID) | ORAL | Status: DC | PRN

## 2012-06-30 MED ORDER — FENTANYL CITRATE 0.05 MG/ML IJ SOLN: 25.0000 ug | INTRAMUSCULAR | Status: DC | PRN

## 2012-06-30 MED ORDER — SODIUM CHLORIDE 0.9 % IV SOLN
INTRAVENOUS | Status: DC
  Administered 2012-07-02: 10:00:00 via INTRAVENOUS

## 2012-06-30 MED ORDER — ROCURONIUM BROMIDE 100 MG/10ML IV SOLN
INTRAVENOUS | Status: DC | PRN
  Administered 2012-06-30: 40 mg via INTRAVENOUS

## 2012-06-30 MED ORDER — METHYLPREDNISOLONE ACETATE 80 MG/ML IJ SUSP
INTRAMUSCULAR | Status: DC | PRN
  Administered 2012-06-30: 160 mg via INTRA_ARTICULAR

## 2012-06-30 MED ORDER — PROPOFOL 10 MG/ML IV BOLUS
INTRAVENOUS | Status: DC | PRN
  Administered 2012-06-30: 50 mg via INTRAVENOUS
  Administered 2012-06-30: 40 mg via INTRAVENOUS

## 2012-06-30 MED ORDER — ACETAMINOPHEN 650 MG RE SUPP: 650.0000 mg | Freq: Four times a day (QID) | RECTAL | Status: DC | PRN

## 2012-06-30 SURGICAL SUPPLY — 46 items
BIT DRILL 4.3MMS DISTAL GRDTED (BIT) ×2 IMPLANT
BLADE SURG ROTATE 9660 (MISCELLANEOUS) IMPLANT
CLOTH BEACON ORANGE TIMEOUT ST (SAFETY) ×3 IMPLANT
COVER MAYO STAND STRL (DRAPES) ×3 IMPLANT
COVER SURGICAL LIGHT HANDLE (MISCELLANEOUS) ×3 IMPLANT
DECANTER SPIKE VIAL GLASS SM (MISCELLANEOUS) IMPLANT
DRAPE STERI IOBAN 125X83 (DRAPES) ×3 IMPLANT
DRILL 4.3MMS DISTAL GRADUATED (BIT) ×3
DRSG MEPILEX BORDER 4X4 (GAUZE/BANDAGES/DRESSINGS) ×6 IMPLANT
DRSG MEPILEX BORDER 4X8 (GAUZE/BANDAGES/DRESSINGS) IMPLANT
DURAPREP 26ML APPLICATOR (WOUND CARE) ×3 IMPLANT
ELECT CAUTERY BLADE 6.4 (BLADE) ×3 IMPLANT
ELECT REM PT RETURN 9FT ADLT (ELECTROSURGICAL) ×3
ELECTRODE REM PT RTRN 9FT ADLT (ELECTROSURGICAL) ×2 IMPLANT
EVACUATOR 1/8 PVC DRAIN (DRAIN) IMPLANT
GAUZE XEROFORM 5X9 LF (GAUZE/BANDAGES/DRESSINGS) IMPLANT
GLOVE BIOGEL PI IND STRL 8 (GLOVE) ×4 IMPLANT
GLOVE BIOGEL PI INDICATOR 8 (GLOVE) ×2
GLOVE ECLIPSE 7.5 STRL STRAW (GLOVE) ×6 IMPLANT
GOWN PREVENTION PLUS XLARGE (GOWN DISPOSABLE) ×3 IMPLANT
GOWN SRG XL XLNG 56XLVL 4 (GOWN DISPOSABLE) ×2 IMPLANT
GOWN STRL NON-REIN LRG LVL3 (GOWN DISPOSABLE) ×3 IMPLANT
GOWN STRL NON-REIN XL XLG LVL4 (GOWN DISPOSABLE) ×1
GUIDEPIN 3.2X17.5 THRD DISP (PIN) ×3 IMPLANT
GUIDEWIRE BALL NOSE 80CM (WIRE) ×3 IMPLANT
HFN LH 130 DEG 11MM X 340MM (Nail) ×3 IMPLANT
KIT BASIN OR (CUSTOM PROCEDURE TRAY) ×3 IMPLANT
KIT ROOM TURNOVER OR (KITS) ×3 IMPLANT
MANIFOLD NEPTUNE II (INSTRUMENTS) IMPLANT
NEEDLE 18GX1X1/2 (RX/OR ONLY) (NEEDLE) ×3 IMPLANT
NEEDLE 22X1 1/2 (OR ONLY) (NEEDLE) ×3 IMPLANT
NS IRRIG 1000ML POUR BTL (IV SOLUTION) ×3 IMPLANT
PACK GENERAL/GYN (CUSTOM PROCEDURE TRAY) ×3 IMPLANT
PAD ARMBOARD 7.5X6 YLW CONV (MISCELLANEOUS) ×6 IMPLANT
SCREW BONE CORTICAL 5.0X40 (Screw) ×3 IMPLANT
SCREW LAG HIP NAIL 10.5X95 (Screw) ×3 IMPLANT
SCREWDRIVER HEX TIP 3.5MM (MISCELLANEOUS) ×3 IMPLANT
STAPLER VISISTAT 35W (STAPLE) ×3 IMPLANT
SUT VIC AB 0 CTB1 27 (SUTURE) ×3 IMPLANT
SUT VIC AB 1 CTB1 27 (SUTURE) ×3 IMPLANT
SUT VIC AB 2-0 CTB1 (SUTURE) ×3 IMPLANT
SYR 20CC LL (SYRINGE) ×3 IMPLANT
SYR 50ML LL SCALE MARK (SYRINGE) ×3 IMPLANT
TOWEL OR 17X24 6PK STRL BLUE (TOWEL DISPOSABLE) ×3 IMPLANT
TOWEL OR 17X26 10 PK STRL BLUE (TOWEL DISPOSABLE) ×3 IMPLANT
WATER STERILE IRR 1000ML POUR (IV SOLUTION) IMPLANT

## 2012-06-30 NOTE — Preoperative (Signed)
Beta Blockers   Reason not to administer Beta Blockers:Lopressor given [phone] 06/30/12

## 2012-06-30 NOTE — Anesthesia Preprocedure Evaluation (Addendum)
Anesthesia Evaluation  Patient identified by MRN, date of birth, ID band  Reviewed: Allergy & Precautions, H&P , NPO status , Patient's Chart, lab work & pertinent test results  Airway Mallampati: II      Dental   Pulmonary shortness of breath and with exertion,  breath sounds clear to auscultation        Cardiovascular hypertension, + Peripheral Vascular Disease Rhythm:Regular Rate:Normal     Neuro/Psych negative neurological ROS     GI/Hepatic Neg liver ROS, GERD-  ,  Endo/Other  Hypothyroidism   Renal/GU Renal disease     Musculoskeletal   Abdominal   Peds  Hematology   Anesthesia Other Findings   Reproductive/Obstetrics                          Anesthesia Physical Anesthesia Plan  ASA: III  Anesthesia Plan: General   Post-op Pain Management:    Induction: Intravenous  Airway Management Planned: Oral ETT  Additional Equipment:   Intra-op Plan:   Post-operative Plan: Extubation in OR  Informed Consent: I have reviewed the patients History and Physical, chart, labs and discussed the procedure including the risks, benefits and alternatives for the proposed anesthesia with the patient or authorized representative who has indicated his/her understanding and acceptance.   Dental advisory given  Plan Discussed with: CRNA, Anesthesiologist and Surgeon  Anesthesia Plan Comments:         Anesthesia Quick Evaluation

## 2012-06-30 NOTE — Anesthesia Postprocedure Evaluation (Signed)
  Anesthesia Post-op Note  Patient: Cheryl [name]  Procedure(s) Performed: Procedure(s): INTRAMEDULLARY (IM) NAIL INTERTROCHANTRIC LEFT (Left) Left Knee Aspiration with Intra-Articular Injection (Left)  Patient Location: PACU  Anesthesia Type:General  Level of Consciousness: awake  Airway and Oxygen Therapy: Patient Spontanous Breathing  Post-op Pain: mild  Post-op Assessment: Post-op Vital signs reviewed  Post-op Vital Signs: Reviewed  Complications: No apparent anesthesia complications

## 2012-06-30 NOTE — Progress Notes (Signed)
[facility] PROGRESS NOTE  Cheryl [name] WUJ:[id number] DOB: October 19, 1925 DOA: 06/28/2012 PCP: Abigail Miyamoto, MD  Assessment/Plan: L Hip fracture  - mechanical fall, with no hx of syncope  - Dr. Luiz Blare (orthopedics) is consulted and planning surgery, awaiting normalization of INR - cont hip fx protocol  - will reverse coumadin and use heparin per pharmacy in anticipation for surgery  Dehydration  - IVF's changed to [facility] after gentle resuscitation  - will continue holding lasix  - monitor for volume overload given pts hx of CHF  [name] with history of DVT/PE  - chronic coumadin being held, s/p Vit K - heparin per pharmacy  - pt appears stable  - Cardiology consulted and no further work up prior to sx needed  ESRD  - Not on dialysis - graft in L arm  - Baseline Creatinine ~2  - Follows with Dr. Allena Katz  -Cr stable. Will monitor  Diastolic CHF  - Echo 01/2010 showed LVEF 60% and mitral regurgitation  - Cardiology consulted and no further rec's  - IV fluids administered for dehydration - will monitor closely  Chronic Hyponatremia  - Likely chronic due to ESRD and diuretics  - at baseline sodium 130  - will monitor  Chronic Anemia  - Appears chronic, baseline Hgb 11  Chronic Constipation  - Continue miralax and colace as needed   Code Status: Full Family Communication: Pt and family in room (indicate person spoken with, relationship, and if by phone, the number) Disposition Plan: Pending   Consultants:  Dr. Luiz Blare (ortho)  [facility]  Procedures:  Pending ORIF of L hip  Antibiotics:  HPI/Subjective: Pt is without complaints.  Objective: Filed Vitals:   06/29/12 0800 06/29/12 1359 06/29/12 2024 06/30/12 0520  BP:  115/40 142/59 125/37  Pulse:  65 67 66  Temp:  98.8 F (37.1 C) 100.1 F (37.8 C) 98.7 F (37.1 C)  TempSrc:   Oral Oral  Resp: 16 16 18 18   Height:      Weight:      SpO2: 98% 94% 95% 96%    Intake/Output Summary (Last 24  hours) at 06/30/12 0823 Last data filed at 06/30/12 0521  Gross per 24 hour  Intake 1431.67 ml  Output   1050 ml  Net 381.67 ml   Filed Weights   06/28/12 1300  Weight: 58.06 kg (128 lb)    Exam:   General:  Awake, in NAD  Cardiovascular: Regular, s1, s2  Respiratory: Normal resp effort  Abdomen: Soft, nondistended  Musculoskeletal: Perfused, no clubbing   Data Reviewed: Basic Metabolic Panel:  Recent Labs Lab 06/28/12 1102 06/29/12 0420  NA 130* 130*  K 3.6 4.7  CL 88* 91*  CO2 30 28  GLUCOSE 108* 103*  BUN 52* 48*  CREATININE 2.45* 2.14*  CALCIUM 9.8 9.3   Liver Function Tests: No results found for this basename: AST, ALT, ALKPHOS, BILITOT, PROT, ALBUMIN,  in the last 168 hours No results found for this basename: LIPASE, AMYLASE,  in the last 168 hours No results found for this basename: AMMONIA,  in the last 168 hours CBC:  Recent Labs Lab 06/24/12 1457 06/28/12 1102 06/29/12 0420 06/30/12 0415  WBC  --  9.5 10.2 13.1*  NEUTROABS  --  7.7  --   --   HGB 10.6* 13.1 12.0 10.9*  HCT  --  38.2 35.2* 31.6*  MCV  --  99.0 99.2 100.0  PLT  --  224 204 187   Cardiac Enzymes:  Recent Labs Lab 06/28/12 1401  TROPONINI <0.30   BNP (last 3 results) No results found for this basename: PROBNP,  in the last 8760 hours CBG: No results found for this basename: GLUCAP,  in the last 168 hours  Recent Results (from the past 240 hour(s))  SURGICAL PCR SCREEN     Status: None   Collection Time    06/30/12  5:38 AM      Result Value Range Status   MRSA, PCR NEGATIVE  NEGATIVE Final   Staphylococcus aureus NEGATIVE  NEGATIVE Final   Comment:            The Xpert SA Assay (FDA     approved for NASAL specimens     in patients over 77 years of age),     is one component of     a comprehensive surveillance     program.  Test performance has     been validated by [employer] for patients greater     than or equal to 77 year old.     It is not  intended     to diagnose infection nor to     guide or monitor treatment.     Studies: Dg Hip Complete Left  06/28/2012   *RADIOLOGY REPORT*  Clinical Data: Larey Seat today, left hip pain.  LEFT HIP - COMPLETE 2+ VIEW  Comparison: None.  Findings: Intertrochanteric fracture of the left hip with slight foreshortening. A component of the fracture extends into the proximal femur/subtrochanteric region towards the medial proximal femur.  No visible pelvic fractures.  IMPRESSION: Intertrochanteric fracture left hip as described.   Original Report Authenticated By: Davonna Belling, M.D.   Dg Chest Port 1 View  06/28/2012   *RADIOLOGY REPORT*  Clinical Data: Fall, hip fracture, preop  PORTABLE CHEST - 1 VIEW  Comparison: 11/20/2011  Findings: Cardiomediastinal silhouette is stable.  No acute infiltrate or pleural effusion.  No pulmonary edema.  Stable chronic mild interstitial prominence.  IMPRESSION: No active disease.  No significant change.   Original Report Authenticated By: Natasha Mead, M.D.    Scheduled Meds: . amLODipine  5 mg Oral Daily  . calcitRIOL  0.25 mcg Oral Daily  . cholecalciferol  800 Units Oral Daily  . docusate sodium  100 mg Oral BID  . levothyroxine  75 mcg Oral Daily  . metoprolol tartrate  100 mg Oral BID  . multivitamin with minerals  1 tablet Oral Daily  . oxybutynin  10 mg Oral Daily  . pantoprazole  40 mg Oral Daily  . polyethylene glycol  17 g Oral Daily  . sertraline  25 mg Oral Daily   Continuous Infusions: . sodium chloride 0.9 % 1,000 mL with potassium chloride 10 mEq infusion 20 mL/hr at 06/29/12 1200    Principal Problem:   Intertrochanteric fracture of left hip Active Problems:   Atrial fibrillation   HYPERTENSION, BENIGN   PULMONARY HYPERTENSION   End stage renal disease   Hyponatremia   Anemia   Chronic diastolic CHF (congestive heart failure)   Constipation, chronic    Time spent:    CHIU, STEPHEN K  [facility] Pager [phone]. If  7PM-7AM, please contact night-coverage at [email or web address], password [facility] 06/30/2012, 8:23 AM  LOS: 2 days

## 2012-06-30 NOTE — Progress Notes (Signed)
ANTICOAGULATION CONSULT NOTE - Follow Up Consult  Pharmacy Consult for Coumadin Indication: atrial fibrillation, VTE prophylaxis and hx PE  Allergies  Allergen Reactions  . Codeine Other (See Comments)    unknown  . Sulfa Antibiotics     unknown    Patient Measurements: Height: 5\' 2"  (157.5 cm) Weight: 128 lb (58.06 kg) IBW/kg (Calculated) : 50.1  Vital Signs: Temp: 98.1 F (36.7 C) (06/11 1210) Temp src: Oral (06/11 0520) BP: 105/46 mmHg (06/11 1339) Pulse Rate: 55 (06/11 1345)  Labs:  Recent Labs  06/28/12 1102 06/28/12 1401 06/29/12 0420 06/29/12 1041 06/30/12 0415  HGB 13.1  --  12.0  --  10.9*  HCT 38.2  --  35.2*  --  31.6*  PLT 224  --  204  --  187  LABPROT 27.8*  --   --  30.4* 27.3*  INR 2.76*  --   --  3.12* 2.69*  CREATININE 2.45*  --  2.14*  --   --   TROPONINI  --  <0.30  --   --   --     Estimated Creatinine Clearance: 15.2 ml/min (by C-G formula based on Cr of 2.14).  Assessment: 79 y/o female on chronic Coumadin for Afib and hx PE. She is now s/p IM nail intertrochanteric fracture. Pharmacy consulted to resume Coumadin post-op. INR was 2.69 this morning s/p vitamin K 5 mg PO on 6/10. Expect INR to trend down still. No bleeding noted, H/H are low post-op, platelets are wnl.  Goal of Therapy:  INR 2-3 Monitor platelets by anticoagulation protocol: Yes   Plan:  -Coumadin 4 mg PO tonight -INR daily -Monitor for signs/symptoms of bleeding  [facility], [address].D., BCPS Clinical Pharmacist Pager: [phone] 06/30/2012 2:42 PM

## 2012-06-30 NOTE — Brief Op Note (Signed)
06/28/2012 - 06/30/2012  11:53 AM  PATIENT:  Cheryl [name]  79 y.o. female  PRE-OPERATIVE DIAGNOSIS:  fracture intertrochanteric Left  POST-OPERATIVE DIAGNOSIS:  fracture intertrochanteric Left  PROCEDURE:  Procedure(s): INTRAMEDULLARY (IM) NAIL INTERTROCHANTRIC LEFT (Left)  SURGEON:  Surgeon(s) and Role:    * Harvie Junior, MD - Primary  PHYSICIAN ASSISTANT:   ASSISTANTS: bethune   ANESTHESIA:   general  EBL:  Total I/O In: 200 [I.V.:200] Out: 400 [Urine:300; Blood:100]  BLOOD ADMINISTERED:none  DRAINS: none   LOCAL MEDICATIONS USED:  MARCAINE     SPECIMEN:  No Specimen  DISPOSITION OF SPECIMEN:  N/A  COUNTS:  YES  TOURNIQUET:  * No tourniquets in log *  DICTATION: .Other Dictation: Dictation Number [id number]  PLAN OF CARE: Admit to inpatient   PATIENT DISPOSITION:  PACU - hemodynamically stable.   Delay start of Pharmacological VTE agent (>24hrs) due to surgical blood loss or risk of bleeding: no

## 2012-06-30 NOTE — Anesthesia Procedure Notes (Signed)
Procedure Name: Intubation Date/Time: 06/30/2012 10:25 AM Performed by: Sherie Don Pre-anesthesia Checklist: Patient identified, Emergency Drugs available, Suction available, Patient being monitored and Timeout performed Patient Re-evaluated:Patient Re-evaluated prior to inductionOxygen Delivery Method: Circle system utilized Preoxygenation: Pre-oxygenation with 100% oxygen Intubation Type: IV induction Ventilation: Mask ventilation without difficulty Laryngoscope Size: Mac and 4 Grade View: Grade I Tube type: Oral Tube size: 7.0 mm Airway Equipment and Method: Stylet Placement Confirmation: ETT inserted through vocal cords under direct vision,  positive ETCO2 and breath sounds checked- equal and bilateral Secured at: 22 cm Tube secured with: Tape Dental Injury: Teeth and Oropharynx as per pre-operative assessment

## 2012-06-30 NOTE — Progress Notes (Signed)
Subjective: Cont pain l hip   Objective: Vital signs in last 24 hours: Temp:  [98.7 F (37.1 C)-100.1 F (37.8 C)] 98.7 F (37.1 C) (06/11 0912) Pulse Rate:  [61-67] 61 (06/11 0912) Resp:  [16-19] 16 (06/11 0912) BP: (113-142)/(37-59) 113/38 mmHg (06/11 0912) SpO2:  [94 %-98 %] 98 % (06/11 0912)  Intake/Output from previous day: 06/10 0701 - 06/11 0700 In: 1431.7 [P.O.:540; I.V.:891.7] Out: 1050 [Urine:1050] Intake/Output this shift: Total I/O In: 0  Out: 250 [Urine:250]   Recent Labs  06/28/12 1102 06/29/12 0420 06/30/12 0415  HGB 13.1 12.0 10.9*    Recent Labs  06/29/12 0420 06/30/12 0415  WBC 10.2 13.1*  RBC 3.55* 3.16*  HCT 35.2* 31.6*  PLT 204 187    Recent Labs  06/28/12 1102 06/29/12 0420  NA 130* 130*  K 3.6 4.7  CL 88* 91*  CO2 30 28  BUN 52* 48*  CREATININE 2.45* 2.14*  GLUCOSE 108* 103*  CALCIUM 9.8 9.3    Recent Labs  06/29/12 1041 06/30/12 0415  INR 3.12* 2.69*    Neurologically intact ABD soft Neurovascular intact Sensation intact distally Intact pulses distally No cellulitis present Compartment soft  Assessment/Plan: l hip fracture with elevated INR// feel risk of continued delay as great as risk of surgery.  Will plan orif today with limited exposure and im rod   Fermon Ureta L 06/30/2012, 10:26 AM

## 2012-06-30 NOTE — Progress Notes (Signed)
OT Cancellation Note  Patient Details Name: Cheryl [name] MRN: [id number] DOB: 1925-12-19   Cancelled Treatment:    Reason Eval/Treat Not Completed: Patient not medically ready. Pt on bedrest and orders for OT eval after surgery that is not scheduled until today. Please reorder when appropriate  Galen Manila 06/30/2012, 9:25 AM

## 2012-06-30 NOTE — Transfer of Care (Signed)
Immediate Anesthesia Transfer of Care Note  Patient: Cheryl [name]  Procedure(s) Performed: Procedure(s): INTRAMEDULLARY (IM) NAIL INTERTROCHANTRIC LEFT (Left) Left Knee Aspiration with Intra-Articular Injection (Left)  Patient Location: PACU  Anesthesia Type:General  Level of Consciousness: sedated and patient cooperative  Airway & Oxygen Therapy: Patient Spontanous Breathing and Patient connected to face mask oxygen  Post-op Assessment: Report given to PACU RN, Post -op Vital signs reviewed and stable and Patient moving all extremities X 4  Post vital signs: Reviewed and stable  Complications: No apparent anesthesia complications

## 2012-07-01 ENCOUNTER — Encounter (HOSPITAL_COMMUNITY): Payer: Self-pay | Admitting: Orthopedic Surgery

## 2012-07-01 LAB — CBC
HCT: 25.7 % — ABNORMAL LOW (ref 36.0–46.0)
Hemoglobin: 9.1 g/dL — ABNORMAL LOW (ref 12.0–15.0)
MCH: 34.6 pg — ABNORMAL HIGH (ref 26.0–34.0)
MCV: 97.7 fL (ref 78.0–100.0)
Platelets: 186 10*3/uL (ref 150–400)
RBC: 2.63 MIL/uL — ABNORMAL LOW (ref 3.87–5.11)
WBC: 14.8 10*3/uL — ABNORMAL HIGH (ref 4.0–10.5)

## 2012-07-01 LAB — BASIC METABOLIC PANEL
BUN: 49 mg/dL — ABNORMAL HIGH (ref 6–23)
Chloride: 90 mEq/L — ABNORMAL LOW (ref 96–112)
GFR calc non Af Amer: 19 mL/min — ABNORMAL LOW (ref >90)
Glucose, Bld: 126 mg/dL — ABNORMAL HIGH (ref 70–99)
Potassium: 4.5 mEq/L (ref 3.5–5.1)
Sodium: 125 mEq/L — ABNORMAL LOW (ref 135–145)

## 2012-07-01 LAB — URINE CULTURE

## 2012-07-01 MED ORDER — DEXTROSE 5 % IV SOLN
1.0000 g | INTRAVENOUS | Status: DC
  Administered 2012-07-01 – 2012-07-03 (×3): 1 g via INTRAVENOUS
  Filled 2012-07-01 (×3): qty 10

## 2012-07-01 MED ORDER — MAGNESIUM CITRATE PO SOLN
1.0000 | ORAL | Status: DC | PRN
  Administered 2012-07-01: 1 via ORAL
  Filled 2012-07-01: qty 296

## 2012-07-01 MED ORDER — WARFARIN SODIUM 5 MG PO TABS
5.0000 mg | ORAL_TABLET | Freq: Once | ORAL | Status: AC
  Administered 2012-07-01: 5 mg via ORAL
  Filled 2012-07-01 (×2): qty 1

## 2012-07-01 NOTE — Clinical Social Work Psychosocial (Signed)
Clinical Social Work Department  BRIEF PSYCHOSOCIAL ASSESSMENT  Patient: Cheryl [name] Account Number: [id number]  Admit date: 06/28/12 Clinical Social Worker Sabino Niemann, MSW Date/Time: 06/29/12 2:00PM Referred by: Physician Date Referred: 06/30/12 Referred for   SNF Placement   Other Referral:  Interview type: Patient and patient's daughter Other interview type: PSYCHOSOCIAL DATA  Living Status: Family Admitted from facility:  Level of care:  Primary support name: Lankford,Marlene Primary support relationship to patient: Daughter Degree of support available:  Strong and vested  CURRENT CONCERNS  Current Concerns   Post-Acute Placement   Other Concerns:  SOCIAL WORK ASSESSMENT / PLAN  CSW met with pt and patient's daughter re: PT recommendation for SNF.   Pt lives with her family  CSW explained placement process and answered questions.   Pt reports [facility]  as her preference    CSW completed FL2 and sent clinicals to [facility].     Assessment/plan status: Information/Referral to [facility]  Other assessment/ plan:  Information/referral to community resources:  SNF   PTAR  PATIENT'S/FAMILY'S RESPONSE TO PLAN OF CARE:  Pt  reports she is agreeable to ST SNF in order to increase strength and independence with mobility prior to returning home  Pt verbalized understanding of placement process and appreciation for CSW assist.   Sabino Niemann, MSW [phone]

## 2012-07-01 NOTE — Progress Notes (Signed)
[facility] PROGRESS NOTE  Cheryl FRANKO ZOX:[id number] DOB: 20-Jun-1925 DOA: 06/28/2012 PCP: Abigail Miyamoto, MD  Assessment/Plan: L Hip fracture  - mechanical fall, with no hx of syncope  - Dr. Luiz Blare (orthopedics) is consulted - cont hip fx protocol  - s/p surgery Dehydration  - IVF's changed to [facility] after gentle resuscitation  - appears to be euvolemic  - consider resuming lasix given pts hx of CHF  [name] with history of DVT/PE  - chronic coumadin resumed, s/p Vit K   - pt appears stable  - INR currently just over 2 ESRD  - Not on dialysis - graft in L arm  - Baseline Creatinine ~2  - Follows with Dr. Allena Katz  - Cr stable. Will cont to monitor  Diastolic CHF  - Echo 01/2010 showed LVEF 60% and mitral regurgitation  - Cardiology consulted and no further rec's   Chronic Hyponatremia  - Likely chronic due to ESRD and diuretics  - will monitor - Na in the 120's. Poss secondary to CHF. Will resume lasix Chronic Anemia  - Appears chronic, baseline Hgb 11  Chronic Constipation  - Continue miralax and colace as needed   Code Status: Full Family Communication: Pt in room (indicate person spoken with, relationship, and if by phone, the number) Disposition Plan: Pending   Consultants:  Orthopedic Surgery  Procedures:  ORIF of L Intertrochanteric Hip on 07/01/12  HPI/Subjective: Pt is without complaints  Objective: Filed Vitals:   06/30/12 2252 07/01/12 0200 07/01/12 0500 07/01/12 0830  BP: 131/49 127/55 141/52 115/44  Pulse: 66 63 63 63  Temp:  98.5 F (36.9 C) 98.5 F (36.9 C) 98.2 F (36.8 C)  TempSrc:      Resp:  16 16 18   Height:      Weight:      SpO2:  96% 100% 100%    Intake/Output Summary (Last 24 hours) at 07/01/12 1150 Last data filed at 07/01/12 0552  Gross per 24 hour  Intake    300 ml  Output    675 ml  Net   -375 ml   Filed Weights   06/28/12 1300  Weight: 58.06 kg (128 lb)    Exam:   General:  Awake, in  NAD  Cardiovascular: regular, s1, s2  Respiratory: normal resp effort, no crackles  Abdomen: soft, nontender  Musculoskeletal: perfused, no clubbing   Data Reviewed: Basic Metabolic Panel:  Recent Labs Lab 06/28/12 1102 06/29/12 0420 07/01/12 0435  NA 130* 130* 125*  K 3.6 4.7 4.5  CL 88* 91* 90*  CO2 30 28 28   GLUCOSE 108* 103* 126*  BUN 52* 48* 49*  CREATININE 2.45* 2.14* 2.20*  CALCIUM 9.8 9.3 8.9   Liver Function Tests: No results found for this basename: AST, ALT, ALKPHOS, BILITOT, PROT, ALBUMIN,  in the last 168 hours No results found for this basename: LIPASE, AMYLASE,  in the last 168 hours No results found for this basename: AMMONIA,  in the last 168 hours CBC:  Recent Labs Lab 06/24/12 1457 06/28/12 1102 06/29/12 0420 06/30/12 0415 07/01/12 0435  WBC  --  9.5 10.2 13.1* 14.8*  NEUTROABS  --  7.7  --   --   --   HGB 10.6* 13.1 12.0 10.9* 9.1*  HCT  --  38.2 35.2* 31.6* 25.7*  MCV  --  99.0 99.2 100.0 97.7  PLT  --  224 204 187 186   Cardiac Enzymes:  Recent Labs Lab 06/28/12 1401  TROPONINI <0.30  BNP (last 3 results) No results found for this basename: PROBNP,  in the last 8760 hours CBG: No results found for this basename: GLUCAP,  in the last 168 hours  Recent Results (from the past 240 hour(s))  URINE CULTURE     Status: None   Collection Time    06/30/12  5:37 AM      Result Value Range Status   Specimen Description URINE, CATHETERIZED   Final   Special Requests CX ADDED AT 0618 ON [id number]   Final   Culture  Setup Time 06/30/2012 06:22   Final   Colony Count >=100,000 COLONIES/ML   Final   Culture ESCHERICHIA COLI   Final   Report Status PENDING   Incomplete  SURGICAL PCR SCREEN     Status: None   Collection Time    06/30/12  5:38 AM      Result Value Range Status   MRSA, PCR NEGATIVE  NEGATIVE Final   Staphylococcus aureus NEGATIVE  NEGATIVE Final   Comment:            The Xpert SA Assay (FDA     approved for NASAL specimens      in patients over 77 years of age),     is one component of     a comprehensive surveillance     program.  Test performance has     been validated by [employer] for patients greater     than or equal to 102 year old.     It is not intended     to diagnose infection nor to     guide or monitor treatment.     Studies: Dg Femur Left  06/30/2012   *RADIOLOGY REPORT*  Clinical Data: Left femoral nail placement.  DG C-ARM 1-60 MIN,LEFT FEMUR - 2 VIEW  Fluoroscopic time:  1 minute and 1 second.  Comparison:  06/28/2012  Findings: Four intraoperative C-arm views submitted for review after surgery.  This reveals long stem femoral rod placement with proximal sliding type screw and distal fixation screw for treatment of left intratrochanteric fracture.  No complication noted.  IMPRESSION: Open reduction and internal fixation left intratrochanteric fracture.   Original Report Authenticated By: Lacy Duverney, M.D.   Dg C-arm 1-60 Min  06/30/2012   *RADIOLOGY REPORT*  Clinical Data: Left femoral nail placement.  DG C-ARM 1-60 MIN,LEFT FEMUR - 2 VIEW  Fluoroscopic time:  1 minute and 1 second.  Comparison:  06/28/2012  Findings: Four intraoperative C-arm views submitted for review after surgery.  This reveals long stem femoral rod placement with proximal sliding type screw and distal fixation screw for treatment of left intratrochanteric fracture.  No complication noted.  IMPRESSION: Open reduction and internal fixation left intratrochanteric fracture.   Original Report Authenticated By: Lacy Duverney, M.D.    Scheduled Meds: . amLODipine  5 mg Oral Daily  . calcitRIOL  0.25 mcg Oral Daily  . cholecalciferol  800 Units Oral Daily  . docusate sodium  100 mg Oral BID  . ferrous sulfate  325 mg Oral BID WC  . levothyroxine  75 mcg Oral Daily  . metoprolol tartrate  100 mg Oral BID  . multivitamin with minerals  1 tablet Oral Daily  . oxybutynin  10 mg Oral Daily  . pantoprazole  40 mg Oral Daily   . polyethylene glycol  17 g Oral Daily  . sertraline  25 mg Oral Daily  . Warfarin - Pharmacist Dosing Inpatient  Does not apply q1800   Continuous Infusions: . sodium chloride      Principal Problem:   Intertrochanteric fracture of left hip Active Problems:   Atrial fibrillation   HYPERTENSION, BENIGN   PULMONARY HYPERTENSION   End stage renal disease   Hyponatremia   Anemia   Chronic diastolic CHF (congestive heart failure)   Constipation, chronic    Time spent:    CHIU, STEPHEN K  [facility] Pager [phone]. If 7PM-7AM, please contact night-coverage at [email or web address], password [facility] 07/01/2012, 11:50 AM  LOS: 3 days

## 2012-07-01 NOTE — Progress Notes (Addendum)
ANTICOAGULATION CONSULT NOTE - Follow Up Consult  Pharmacy Consult for Coumadin Indication: atrial fibrillation, VTE prophylaxis and hx PE  Allergies  Allergen Reactions  . Codeine Other (See Comments)    unknown  . Sulfa Antibiotics     unknown    Patient Measurements: Height: 5\' 2"  (157.5 cm) Weight: 128 lb (58.06 kg) IBW/kg (Calculated) : 50.1  Vital Signs: Temp: 98.2 F (36.8 C) (06/12 0830) BP: 115/44 mmHg (06/12 0830) Pulse Rate: 63 (06/12 0830)  Labs:  Recent Labs  06/29/12 0420 06/29/12 1041 06/30/12 0415 07/01/12 0435  HGB 12.0  --  10.9* 9.1*  HCT 35.2*  --  31.6* 25.7*  PLT 204  --  187 186  LABPROT  --  30.4* 27.3* 22.7*  INR  --  3.12* 2.69* 2.10*  CREATININE 2.14*  --   --  2.20*    Estimated Creatinine Clearance: 14.8 ml/min (by C-G formula based on Cr of 2.2).  Assessment: 79 y/o Cheryl [name] on chronic Coumadin for Afib and hx PE. She is now s/p IM nail intertrochanteric fracture. INR was 2.1 this morning s/p vitamin K 5 mg PO on 6/10. INR may still trend down s/p vitamin K - will give extra tonight. No bleeding noted, H/H are low post-op, platelets are wnl.  Goal of Therapy:  INR 2-3 Monitor platelets by anticoagulation protocol: Yes   Plan:  -Coumadin 5 mg PO tonight -INR daily -Monitor for signs/symptoms of bleeding  [facility], [address].D., BCPS Clinical Pharmacist Pager: [phone] 07/01/2012 2:09 PM   Addendum: Pharmacy consulted to begin ceftriaxone for E coli UTI, sensitivities are pending. WBC are trending up.  -Ceftriaxone 1 g IV q24h - consider d/c after 3 doses  [facility], [address].D., BCPS Clinical Pharmacist Pager: [phone] 07/01/2012 2:15 PM

## 2012-07-01 NOTE — Progress Notes (Signed)
Subjective: 1 Day Post-Op Procedure(s) (LRB): INTRAMEDULLARY (IM) NAIL INTERTROCHANTRIC LEFT (Left) Left Knee Aspiration with Intra-Articular Injection (Left) Patient reports pain as 3 on 0-10 scale.    Objective: Vital signs in last 24 hours: Temp:  [98.1 F (36.7 C)-98.7 F (37.1 C)] 98.2 F (36.8 C) (06/12 0830) Pulse Rate:  [54-66] 63 (06/12 0830) Resp:  [12-21] 18 (06/12 0830) BP: (100-155)/(37-77) 115/44 mmHg (06/12 0830) SpO2:  [93 %-100 %] 100 % (06/12 0830)  Intake/Output from previous day: 06/11 0701 - 06/12 0700 In: 550 [P.O.:300; I.V.:250] Out: 1075 [Urine:975; Blood:100] Intake/Output this shift:     Recent Labs  06/28/12 1102 06/29/12 0420 06/30/12 0415 07/01/12 0435  HGB 13.1 12.0 10.9* 9.1*    Recent Labs  06/30/12 0415 07/01/12 0435  WBC 13.1* 14.8*  RBC 3.16* 2.63*  HCT 31.6* 25.7*  PLT 187 186    Recent Labs  06/29/12 0420 07/01/12 0435  NA 130* 125*  K 4.7 4.5  CL 91* 90*  CO2 28 28  BUN 48* 49*  CREATININE 2.14* 2.20*  GLUCOSE 103* 126*  CALCIUM 9.3 8.9    Recent Labs  06/30/12 0415 07/01/12 0435  INR 2.69* 2.10*  Left hip exam:  Neurovascular intact Sensation intact distally Intact pulses distally Dorsiflexion/Plantar flexion intact Incision: dressing C/D/I Compartment soft  Assessment/Plan: 1 Day Post-Op Procedure(s) (LRB): INTRAMEDULLARY (IM) NAIL INTERTROCHANTRIC LEFT (Left) Left Knee Aspiration with Intra-Articular Injection (Left) Plan: Up with therapy WBAT on Left leg Resume coumadin.  Cheryl [name] G 07/01/2012, 9:04 AM

## 2012-07-01 NOTE — Evaluation (Signed)
Occupational Therapy Evaluation Patient Details Name: Cheryl [name] MRN: [id number] DOB: 18-Jun-1925 Today's Date: 07/01/2012 Time: 4098-1191 OT Time Calculation (min): 22 min  OT Assessment / Plan / Recommendation Clinical Impression    Pt is a 79 y.o. female s/p fall resulting in intertrochanteric fx of L hip; pt is s/p ORIF. Pt presents with below problem list. Will benefit from skilled OT to maximize independence prior to d/c.  Recommend SNF upon acute D/C prior to D/C home alone.      OT Assessment  Patient needs continued OT Services    Follow Up Recommendations  SNF    Barriers to Discharge      Equipment Recommendations  Other (comment) (defer to snf)    Recommendations for Other Services    Frequency  Min 2X/week    Precautions / Restrictions Precautions Precautions: Fall Precaution Comments: pt with recent falls  Restrictions Weight Bearing Restrictions: No   Pertinent Vitals/Pain Pain 10/10 with activity in L hip; pt premedicated.    ADL  Eating/Feeding: Independent Where Assessed - Eating/Feeding: Chair Grooming: Set up;Supervision/safety Where Assessed - Grooming: Supported sitting Upper Body Bathing: Set up;Supervision/safety Where Assessed - Upper Body Bathing: Supported sitting Lower Body Bathing: Moderate assistance Where Assessed - Lower Body Bathing: Supported sit to stand Upper Body Dressing: Set up;Supervision/safety Where Assessed - Upper Body Dressing: Supported sitting Lower Body Dressing: Moderate assistance Where Assessed - Lower Body Dressing: Supported sit to Pharmacist, hospital: Moderate assistance Toilet Transfer Method: Sit to Barista: Other (comment) (from bed to recliner chair) Tub/Shower Transfer Method: Not assessed Equipment Used: Gait belt;Rolling walker Transfers/Ambulation Related to ADLs: Mod A for transfers. Min A for ambulation ADL Comments: Pt at overall Mod A level for LB ADLs. Pt c/o pain in  left hip during session. Performed transfer from bed to recliner chair.    OT Diagnosis: Acute pain  OT Problem List: Decreased strength;Decreased range of motion;Decreased activity tolerance;Impaired balance (sitting and/or standing);Decreased knowledge of use of DME or AE;Pain OT Treatment Interventions: Self-care/ADL training;DME and/or AE instruction;Patient/family education;Balance training;Therapeutic activities   OT Goals Acute Rehab OT Goals OT Goal Formulation: With patient Time For Goal Achievement: 07/08/12 Potential to Achieve Goals: Good ADL Goals Pt Will Perform Grooming: with modified independence;Standing at sink ADL Goal: Grooming - Progress: Goal set today Pt Will Perform Lower Body Bathing: with modified independence;Sit to stand from chair ADL Goal: Lower Body Bathing - Progress: Goal set today Pt Will Perform Lower Body Dressing: with modified independence;Sit to stand from bed;Sit to stand from chair ADL Goal: Lower Body Dressing - Progress: Goal set today Pt Will Transfer to Toilet: with modified independence;Ambulation ADL Goal: Toilet Transfer - Progress: Goal set today Pt Will Perform Toileting - Clothing Manipulation: with modified independence;Standing ADL Goal: Toileting - Clothing Manipulation - Progress: Goal set today Pt Will Perform Toileting - Hygiene: with modified independence;Sit to stand from 3-in-1/toilet;Sitting on 3-in-1 or toilet ADL Goal: Toileting - Hygiene - Progress: Goal set today Pt Will Perform Tub/Shower Transfer: Tub transfer;with supervision;Ambulation;with DME ADL Goal: Tub/Shower Transfer - Progress: Goal set today  Visit Information  Last OT Received On: 07/01/12 Assistance Needed: +1 PT/OT Co-Evaluation/Treatment: Yes    Subjective Data      Prior Functioning     Home Living Lives With: Alone Available Help at Discharge: Skilled Nursing Facility Type of Home: Apartment Home Access: Stairs to enter [employer] of Steps: 3 Entrance Stairs-Rails: Right;Left Bathroom Shower/Tub: Engineer, manufacturing systems: Handicapped  height Bathroom Accessibility: Yes How Accessible: Accessible via walker Home Adaptive Equipment: Grab bars in shower;Grab bars around toilet;Shower chair with back Prior Function Level of Independence: Independent Able to Take Stairs?: Yes (did not need rails) Driving: No Comments: pt has  Communication Communication: HOH         Vision/Perception     Cognition  Cognition Arousal/Alertness: Awake/alert Behavior During Therapy: WFL for tasks assessed/performed Overall Cognitive Status: Within Functional Limits for tasks assessed    Extremity/Trunk Assessment Right Upper Extremity Assessment RUE ROM/Strength/Tone: [facility] for tasks assessed Left Upper Extremity Assessment LUE ROM/Strength/Tone: WFL for tasks assessed (reports arthritis in left shoulder)     Mobility Bed Mobility Bed Mobility: Supine to Sit;Sitting - Scoot to Edge of Bed Supine to Sit: 4: Min assist;With rails;HOB elevated Sitting - Scoot to [employer] of Bed: 4: Min assist Details for Bed Mobility Assistance: Min A to advance LLE off of bed. Cues for technique and sequencing.  Transfers Transfers: Sit to Stand;Stand to Sit Sit to Stand: 3: Mod assist;From bed Stand to Sit: 3: Mod assist;To chair/3-in-1 Details for Transfer Assistance: (A) to transfer fully to upright position with bed elevated; required multimodal cues for hand placement and safety with RW; pt fearful initially with transfer        Balance   End of Session OT - End of Session Equipment Utilized During Treatment: Gait belt Activity Tolerance: Patient tolerated treatment well;Patient limited by pain Patient left: in chair;with call bell/phone within reach  [employer] OTR/L [phone] 07/01/2012, 1:09 PM

## 2012-07-01 NOTE — Evaluation (Signed)
Physical Therapy Evaluation Patient Details Name: Cheryl [name] MRN: [id number] DOB: Dec 08, 1925 Today's Date: 07/01/2012 Time: 4098-1191 PT Time Calculation (min): 21 min  PT Assessment / Plan / Recommendation Clinical Impression  Pt is a 79 y.o. female s/p fall resulting in intertrochanteric fx of L hip; pt is s/p ORIF. Pt presents with mobility and strength deficits. Will benefit from skilled PT to maximize functional mobility. PT to recommend SNF upon acute D/C to maximzie functonal mobility prior to D/C home alone.     PT Assessment  Patient needs continued PT services    Follow Up Recommendations  SNF;Supervision/Assistance - 24 hour    Does the patient have the potential to tolerate intense rehabilitation      Barriers to Discharge Decreased caregiver support pt lives alone    Equipment Recommendations  None recommended by PT    Recommendations for Other Services     Frequency Min 3X/week    Precautions / Restrictions Precautions Precautions: Fall Precaution Comments: pt with recent falls  Restrictions Weight Bearing Restrictions: No   Pertinent Vitals/Pain 10/10 with activity in L hip;pt premedicated.       Mobility  Bed Mobility Bed Mobility: Supine to Sit;Sitting - Scoot to Edge of Bed Supine to Sit: 4: Min assist;With rails;HOB elevated Sitting - Scoot to [place] of Bed: 4: Min assist Details for Bed Mobility Assistance: Min A to advance LLE off of bed. Cues for technique and sequencing.  Transfers Transfers: Sit to Stand;Stand to Sit Sit to Stand: 3: Mod assist;From bed Stand to Sit: 3: Mod assist;To chair/3-in-1 Details for Transfer Assistance: (A) to transfer fully to upright position with bed elevated; required multimodal cues for hand placement and safety with RW; pt fearful initially with transfer Ambulation/Gait Ambulation/Gait Assistance: 3: Mod assist Ambulation Distance (Feet): 4 Feet Assistive device: Rolling walker Ambulation/Gait Assistance  Details: required facilitation to advance L LE; continuous cues for gt sequencing and safety with RW; requires (A) to advance and manage RW with turns  Gait Pattern: Step-to pattern;Left foot flat;Decreased stride length;Trunk flexed;Wide base of support Gait velocity: decreased  Stairs: No Wheelchair Mobility Wheelchair Mobility: No    Exercises Total Joint Exercises Ankle Circles/Pumps: AROM;Both;10 reps;Supine   PT Diagnosis: Difficulty walking;Acute pain  PT Problem List: Decreased strength;Decreased range of motion;Decreased balance;Decreased mobility;Decreased knowledge of use of DME;Decreased safety awareness;Pain PT Treatment Interventions: DME instruction;Gait training;Functional mobility training;Therapeutic activities;Therapeutic exercise;Balance training;Neuromuscular re-education;Patient/family education   PT Goals Acute Rehab PT Goals PT Goal Formulation: With patient Time For Goal Achievement: 07/08/12 Potential to Achieve Goals: Good Pt will go Supine/Side to Sit: with modified independence PT Goal: Supine/Side to Sit - Progress: Goal set today Pt will go Sit to Supine/Side: with modified independence PT Goal: Sit to Supine/Side - Progress: Goal set today Pt will go Sit to Stand: with supervision PT Goal: Sit to Stand - Progress: Goal set today Pt will go Stand to Sit: with supervision PT Goal: Stand to Sit - Progress: Goal set today Pt will Ambulate: 51 - 150 feet;with supervision;with rolling walker PT Goal: Ambulate - Progress: Goal set today  Visit Information  Last PT Received On: 07/01/12 Assistance Needed: +1 PT/OT Co-Evaluation/Treatment: Yes    Subjective Data  Subjective: pt lying supine; agreeable to therapy  Patient Stated Goal: rehab to [place]   Prior Functioning  Home Living Lives With: Alone Available Help at Discharge: Skilled Nursing Facility Type of Home: Apartment Home Access: Stairs to enter [employer] of Steps: 3 Entrance  Stairs-Rails: Right;Left  Bathroom Shower/Tub: Engineer, manufacturing systems: Handicapped height Bathroom Accessibility: Yes How Accessible: Accessible via walker Home Adaptive Equipment: Grab bars in shower;Grab bars around toilet;Shower chair with back Prior Function Level of Independence: Independent Able to Take Stairs?: Yes (did not need rails ) Driving: No Comments: pt has  Communication Communication: Surveyor, mining Arousal/Alertness: Awake/alert Behavior During Therapy: WFL for tasks assessed/performed Overall Cognitive Status: Within Functional Limits for tasks assessed    Extremity/Trunk Assessment Right Upper Extremity Assessment RUE ROM/Strength/Tone: [facility] for tasks assessed Left Upper Extremity Assessment LUE ROM/Strength/Tone: WFL for tasks assessed (reports arthritis in left shoulder) Right Lower Extremity Assessment RLE ROM/Strength/Tone: WFL for tasks assessed RLE Sensation: WFL - Light Touch Left Lower Extremity Assessment LLE ROM/Strength/Tone: Deficits;Unable to fully assess;Due to pain LLE ROM/Strength/Tone Deficits: DF/PF WFL; pt unable to perform LAQ or SLR; grossly 2+/5 in hip  LLE Sensation: WFL - Light Touch Trunk Assessment Trunk Assessment: Kyphotic   Balance Balance Balance Assessed: Yes Static Sitting Balance Static Sitting - Balance Support: No upper extremity supported;Feet supported Static Sitting - Level of Assistance: 5: Stand by assistance Static Sitting - Comment/# of Minutes: tolerated sitting EOB ~102min   End of Session PT - End of Session Equipment Utilized During Treatment: Gait belt;Oxygen Activity Tolerance: Patient tolerated treatment well Patient left: in chair;with call bell/phone within reach Nurse Communication: Mobility status  GP     Donell Sievert, Isabella [phone] 07/01/2012, 11:17 AM

## 2012-07-01 NOTE — Op Note (Signed)
NAMESHACONDA, HAJDUK            ACCOUNT NO.:  [id number]  MEDICAL RECORD NO.:  [id number]  LOCATION:  [id number]                        FACILITY:  [facility]  PHYSICIAN:  Harvie Junior, M.D.   DATE OF BIRTH:  11/21/1925  DATE OF PROCEDURE:  06/30/2012 DATE OF DISCHARGE:                              OPERATIVE REPORT   PREOPERATIVE DIAGNOSIS:  Intertrochanteric hip fracture.  POSTOPERATIVE DIAGNOSIS:  Intertrochanteric hip fracture.  PROCEDURE: 1. Open reduction and internal fixation of left intertrochanteric hip     fracture with an intramedullary rod, 11 x 340 mm with proximal and     distal locking screws. 2. Aspiration and injection of left knee of 2 mL of 80 mg/mL Depo-     Medrol and 10 mL of Marcaine.  SURGEON:  Harvie Junior, M.D.  ASSISTANT:  Marshia Ly, P.A.  ANESTHESIA:  General.  BRIEF HISTORY:  Ms. Sciortino is a 79 year old female with a history of having had intertrochanteric hip fracture on the left side.  She came in with elevated INR and we gave her 2 days for INR to resolve once the INR had result of reasonable level, which was 2.6.  We felt a continued delay to surgery.  We had a greater risk or impact than going ahead and proceeding with the an incumbent risk of bleeding with an INR of 2.6. Ultimately after long discussion, she was taken to the operating room at this time.  PROCEDURE IN DETAIL: The patient was taken to the operating room.  After adequate level of anesthesia was obtained with general anesthetic, the patient was placed prone on the operating table.  The left leg was then prepped and draped in usual sterile fashion after she was placed onto the fracture table. Following this, fluoro was used to get our landmarks and then a small incision was made just proximal to the hip.  Subcutaneous tissue to the level the tip of the trochanter and a guidewire was advanced into the tip of the trochanter in both AP and lateral.  This was over-reamed  for an introduction.  A guidewire was advanced down the shaft and this was measured to be 340 mm.  A single 13-mm reamer was advanced down the shaft and back out.  A 11 x 340 mm rod was then advanced into the hip into the appropriate position, and this was noted to be in appropriate position.  Following this, a and a guidewire was placed into the central portion of the head on both AP and lateral fluoro and this was measured and a 8 mm screw was advanced through the rod into the central portion of the head on both AP and lateral fluoro.  Once this was completed, attention turned down distally and a single distal interlocking screw was placed to control rotation.  Once this was done, we noted that she did have a large knee effusion.  At this point, we aspirated the knee for 30 mL of straw-colored fluid.  At that point, I felt that we could help her with an injection of the knee.  We then injected her knee with 10 mL of Marcaine and 2 mL of 80 mg/mL Depo-Medrol.  At this point, the  wounds were all irrigated and closed in layers and sterile compressive dressing was applied.  The patient was taken to recovery room and was noted to be in satisfactory condition.  Estimated blood loss for these procedure was 100 mL.     Harvie Junior, M.D.     Ranae Plumber  D:  06/30/2012  T:  07/01/2012  Job:  [id number]

## 2012-07-01 NOTE — Clinical Social Work Placement (Addendum)
Clinical Social Work Department  CLINICAL SOCIAL WORK PLACEMENT NOTE  07/01/2012 Patient: EMILYANN BANKA  Account Number: [id number]   Admit date: 06/29/12 Clinical Social Worker: Sabino Niemann MSW Date/time: 06/30/12 3:30 PM  Clinical Social Work is seeking post-discharge placement for this patient at the following level of care: SKILLED NURSING (*CSW will update this form in Epic as items are completed)  07/01/2012 Patient/family provided with [facility] Department of Clinical Social Work's list of facilities offering this level of care within the geographic area requested by the patient (or if unable, by the patient's family).   Patient/family informed of their freedom to choose among providers that offer the needed level of care, that participate in Medicare, Medicaid or managed care program needed by the patient, have an available bed and are willing to accept the patient.  07/01/2012  Patient/family informed of [facility]' ownership interest in [facility], as well as of the fact that they are under no obligation to receive care at this facility.  PASARR submitted to EDS on Pre-existing PASARR number received from EDS on  FL2 transmitted to all facilities in geographic area requested by pt/family on 07/01/2012  FL2 transmitted to all facilities within larger geographic area on  Patient informed that his/her managed care company has contracts with or will negotiate with certain facilities, including the following:  Patient/family informed of bed offers received: 07/01/2012  Patient chooses bed at [facility] Physician recommends and patient chooses bed at  Patient to be transferred to on  Patient to be transferred to facility by [facility] The following physician request were entered in Epic:  Additional Comments:

## 2012-07-02 ENCOUNTER — Encounter (HOSPITAL_COMMUNITY): Payer: Medicare Other

## 2012-07-02 LAB — CBC
HCT: 24.8 % — ABNORMAL LOW (ref 36.0–46.0)
Hemoglobin: 8.8 g/dL — ABNORMAL LOW (ref 12.0–15.0)
MCH: 34.1 pg — ABNORMAL HIGH (ref 26.0–34.0)
MCHC: 35.5 g/dL (ref 30.0–36.0)
RBC: 2.58 MIL/uL — ABNORMAL LOW (ref 3.87–5.11)

## 2012-07-02 LAB — BASIC METABOLIC PANEL
BUN: 61 mg/dL — ABNORMAL HIGH (ref 6–23)
CO2: 28 mEq/L (ref 19–32)
Chloride: 88 mEq/L — ABNORMAL LOW (ref 96–112)
GFR calc Af Amer: 21 mL/min — ABNORMAL LOW (ref >90)
Potassium: 5.2 mEq/L — ABNORMAL HIGH (ref 3.5–5.1)

## 2012-07-02 LAB — PROTIME-INR
INR: 1.92 — ABNORMAL HIGH (ref 0.00–1.49)
Prothrombin Time: 21.2 seconds — ABNORMAL HIGH (ref 11.6–15.2)

## 2012-07-02 MED ORDER — SODIUM CHLORIDE 0.9 % IV SOLN
INTRAVENOUS | Status: DC
  Administered 2012-07-02 – 2012-07-03 (×2): via INTRAVENOUS

## 2012-07-02 MED ORDER — WARFARIN SODIUM 5 MG PO TABS
5.0000 mg | ORAL_TABLET | Freq: Once | ORAL | Status: AC
  Administered 2012-07-02: 5 mg via ORAL
  Filled 2012-07-02: qty 1

## 2012-07-02 MED ORDER — SENNA 8.6 MG PO TABS
1.0000 | ORAL_TABLET | Freq: Every day | ORAL | Status: DC | PRN
  Filled 2012-07-02: qty 1

## 2012-07-02 MED ORDER — LUBIPROSTONE 24 MCG PO CAPS
24.0000 ug | ORAL_CAPSULE | Freq: Two times a day (BID) | ORAL | Status: DC
  Filled 2012-07-02 (×3): qty 1

## 2012-07-02 MED ORDER — AMPICILLIN 500 MG PO CAPS: 500.0000 mg | ORAL_CAPSULE | Freq: Two times a day (BID) | ORAL | Status: DC

## 2012-07-02 MED ORDER — LUBIPROSTONE 24 MCG PO CAPS: 24.0000 ug | ORAL_CAPSULE | Freq: Two times a day (BID) | ORAL | Status: DC

## 2012-07-02 NOTE — Progress Notes (Signed)
   SUBJECTIVE:  Itching.  No chest pain.  No SOB   PHYSICAL EXAM Filed Vitals:   07/02/12 0007 07/02/12 0220 07/02/12 0424 07/02/12 0500  BP:    140/46  Pulse: 64 63 65 67  Temp:    98 F (36.7 C)  TempSrc:      Resp: 14 14 16 16   Height:      Weight:      SpO2: 92% 92% 93% 94%   General:  No distress Lungs:  clear Heart:  RRR Abdomen:  Positive bowel sounds, no rebound no guarding Extremities:  No edema  LABS: Lab Results  Component Value Date   TROPONINI <0.30 06/28/2012   Results for orders placed during the hospital encounter of 06/28/12 (from the past 24 hour(s))  CBC     Status: Abnormal   Collection Time    07/02/12  4:38 AM      Result Value Range   WBC 17.0 (*) 4.0 - 10.5 K/uL   RBC 2.58 (*) 3.87 - 5.11 MIL/uL   Hemoglobin 8.8 (*) 12.0 - 15.0 g/dL   HCT 16.1 (*) 09.6 - 04.5 %   MCV 96.1  78.0 - 100.0 fL   MCH 34.1 (*) 26.0 - 34.0 pg   MCHC 35.5  30.0 - 36.0 g/dL   RDW 40.9 (*) 81.1 - 91.4 %   Platelets 222  150 - 400 K/uL  PROTIME-INR     Status: Abnormal   Collection Time    07/02/12  4:38 AM      Result Value Range   Prothrombin Time 21.2 (*) 11.6 - 15.2 seconds   INR 1.92 (*) 0.00 - 1.49  BASIC METABOLIC PANEL     Status: Abnormal   Collection Time    07/02/12  4:38 AM      Result Value Range   Sodium 123 (*) 135 - 145 mEq/L   Potassium 5.2 (*) 3.5 - 5.1 mEq/L   Chloride 88 (*) 96 - 112 mEq/L   CO2 28  19 - 32 mEq/L   Glucose, Bld 130 (*) 70 - 99 mg/dL   BUN 61 (*) 6 - 23 mg/dL   Creatinine, Ser 7.82 (*) 0.50 - 1.10 mg/dL   Calcium 9.1  8.4 - 95.6 mg/dL   GFR calc non Af Amer 18 (*) >90 mL/min   GFR calc Af Amer 21 (*) >90 mL/min    Intake/Output Summary (Last 24 hours) at 07/02/12 0912 Last data filed at 07/02/12 0220  Gross per 24 hour  Intake    300 ml  Output    150 ml  Net    150 ml    ASSESSMENT AND PLAN:  Intertrochanteric fracture of left hip:  Status post ORIF.  No further cardiac work up needed.  OK to discontinue  telemetry.    Atrial fibrillation:  Warfarin restarted.    HYPERTENSION, BENIGN:  BP OK. (Slightly up and down.)  Continue current meds.    Chronic diastolic CHF (congestive heart failure):  Seems to be euvolemic.  No change in therapy.  Creat is stable.     Please call us with further questions.    Fayrene Fearing [facility] 07/02/2012 9:12 AM

## 2012-07-02 NOTE — Progress Notes (Signed)
ANTICOAGULATION CONSULT NOTE   Pharmacy Consult for Coumadin Indication: atrial fibrillation, VTE prophylaxis and hx PE  Labs:  Recent Labs  06/30/12 0415 07/01/12 0435 07/02/12 0438  HGB 10.9* 9.1* 8.8*  HCT 31.6* 25.7* 24.8*  PLT 187 186 222  LABPROT 27.3* 22.7* 21.2*  INR 2.69* 2.10* 1.92*  CREATININE  --  2.20* 2.35*    Estimated Creatinine Clearance: 13.8 ml/min (by C-G formula based on Cr of 2.35).  Assessment: 79 y/o female on chronic Coumadin for Afib and hx PE. She is now s/p IM nail intertrochanteric fracture. INR was down to 1.9 this morning s/p vitamin K 5 mg PO on 6/10. INR may still trend down s/p vitamin K - will give extra again tonight. No bleeding noted, H/H are low post-op, platelets are wnl.  Goal of Therapy:  INR 2-3 Monitor platelets by anticoagulation protocol: Yes   Plan:  -Coumadin 5 mg PO tonight -INR daily -Monitor for signs/symptoms of bleeding  Sheppard Coil PharmD., BCPS Clinical Pharmacist Pager [phone] 07/02/2012 10:56 AM

## 2012-07-02 NOTE — Progress Notes (Addendum)
Subjective: 2 Days Post-Op Procedure(s) (LRB): INTRAMEDULLARY (IM) NAIL INTERTROCHANTRIC LEFT (Left) Left Knee Aspiration with Intra-Articular Injection (Left) Patient reports pain as 3 on 0-10 scale.   Up to chair. Objective: Vital signs in last 24 hours: Temp:  [98 F (36.7 C)-98.2 F (36.8 C)] 98 F (36.7 C) (06/13 0500) Pulse Rate:  [59-67] 66 (06/13 0930) Resp:  [14-16] 16 (06/13 0500) BP: (103-155)/(44-50) 140/46 mmHg (06/13 0500) SpO2:  [92 %-100 %] 94 % (06/13 0500)  Intake/Output from previous day: 06/12 0701 - 06/13 0700 In: 300 [P.O.:300] Out: 250 [Urine:250] Intake/Output this shift:     Recent Labs  06/30/12 0415 07/01/12 0435 07/02/12 0438  HGB 10.9* 9.1* 8.8*    Recent Labs  07/01/12 0435 07/02/12 0438  WBC 14.8* 17.0*  RBC 2.63* 2.58*  HCT 25.7* 24.8*  PLT 186 222    Recent Labs  07/01/12 0435 07/02/12 0438  NA 125* 123*  K 4.5 5.2*  CL 90* 88*  CO2 28 28  BUN 49* 61*  CREATININE 2.20* 2.35*  GLUCOSE 126* 130*  CALCIUM 8.9 9.1    Recent Labs  07/01/12 0435 07/02/12 0438  INR 2.10* 1.92*   Left hip: Neurovascular intact Sensation intact distally Intact pulses distally Dorsiflexion/Plantar flexion intact Incision: dressing C/D/I No cellulitis present Compartment soft  Assessment/Plan: 2 Days Post-Op Procedure(s) (LRB): INTRAMEDULLARY (IM) NAIL INTERTROCHANTRIC LEFT (Left) Left Knee Aspiration with Intra-Articular Injection (Left) Plan: Up with therapy Discharge to SNF  When medically stable. WBAT on Left  Cont po coumadin as pre op. Norco 5mg  prn pain. F/U Dr Luiz Blare in 2 weeks. Natalin Bible G 07/02/2012, 1:11 PM

## 2012-07-02 NOTE — Progress Notes (Addendum)
[facility] PROGRESS NOTE  Cheryl [name]:[id number] DOB: [birth year] DOA: 06/28/2012 PCP: Abigail Miyamoto, MD  Assessment/Plan: L Hip fracture  - mechanical fall, with no hx of syncope  - Dr. Luiz Blare (orthopedics) is consulted  - cont hip fx protocol  - s/p surgery  Dehydration   - appears to be euvolemic  - hold lasix as below  [name] with history of DVT/PE  - chronic coumadin resumed, s/p Vit K  - pt appears stable  - INR currently 1.92 ESRD  - Not on dialysis - graft in L arm  - Baseline Creatinine ~2  - Follows with Dr. Allena Katz  - Cr slowly trending up. Will hold lasix Diastolic CHF  - Echo 01/2010 showed LVEF 60% and mitral regurgitation  - Cardiology consulted and no further rec's  Chronic Hyponatremia  - Likely chronic due to ESRD and diuretics  - will monitor  - Na in the 120's. Increase NS Chronic Anemia  - Appears chronic, baseline Hgb 11  Chronic Constipation  - Continue miralax and colace as needed - Consider mg citrate UTI: -E.coli in urine cx -On Rocephin  Code Status: full Family Communication: Pt in room (indicate person spoken with, relationship, and if by phone, the number) Disposition Plan: Pending   Consultants:  Orthopedic surgery  Procedures: ORIF of L Intertrochanteric Hip on 07/01/12  Antibiotics: Rocephin 07/01/12>>>  HPI/Subjective: No complaints  Objective: Filed Vitals:   07/02/12 0007 07/02/12 0220 07/02/12 0424 07/02/12 0500  BP:    140/46  Pulse: 64 63 65 67  Temp:    98 F (36.7 C)  TempSrc:      Resp: 14 14 16 16   Height:      Weight:      SpO2: 92% 92% 93% 94%    Intake/Output Summary (Last 24 hours) at 07/02/12 0814 Last data filed at 07/02/12 0220  Gross per 24 hour  Intake    300 ml  Output    250 ml  Net     50 ml   Filed Weights   06/28/12 1300  Weight: 58.06 kg (128 lb)    Exam:   General:  Awake in Nad  Cardiovascular: regular, s1, s2  Respiratory: normal resp effort, no  wheezing  Abdomen: soft, decreased bowel sounds  Musculoskeletal: perfused, no clubbing   Data Reviewed: Basic Metabolic Panel:  Recent Labs Lab 06/28/12 1102 06/29/12 0420 07/01/12 0435 07/02/12 0438  NA 130* 130* 125* 123*  K 3.6 4.7 4.5 5.2*  CL 88* 91* 90* 88*  CO2 30 28 28 28   GLUCOSE 108* 103* 126* 130*  BUN 52* 48* 49* 61*  CREATININE 2.45* 2.14* 2.20* 2.35*  CALCIUM 9.8 9.3 8.9 9.1   Liver Function Tests: No results found for this basename: AST, ALT, ALKPHOS, BILITOT, PROT, ALBUMIN,  in the last 168 hours No results found for this basename: LIPASE, AMYLASE,  in the last 168 hours No results found for this basename: AMMONIA,  in the last 168 hours CBC:  Recent Labs Lab 06/28/12 1102 06/29/12 0420 06/30/12 0415 07/01/12 0435 07/02/12 0438  WBC 9.5 10.2 13.1* 14.8* 17.0*  NEUTROABS 7.7  --   --   --   --   HGB 13.1 12.0 10.9* 9.1* 8.8*  HCT 38.2 35.2* 31.6* 25.7* 24.8*  MCV 99.0 99.2 100.0 97.7 96.1  PLT 224 204 187 186 222   Cardiac Enzymes:  Recent Labs Lab 06/28/12 1401  TROPONINI <0.30   BNP (last 3 results) No results  found for this basename: PROBNP,  in the last 8760 hours CBG: No results found for this basename: GLUCAP,  in the last 168 hours  Recent Results (from the past 240 hour(s))  URINE CULTURE     Status: None   Collection Time    06/30/12  5:37 AM      Result Value Range Status   Specimen Description URINE, CATHETERIZED   Final   Special Requests CX ADDED AT 0618 ON [id number]   Final   Culture  Setup Time 06/30/2012 06:22   Final   Colony Count >=100,000 COLONIES/ML   Final   Culture ESCHERICHIA COLI   Final   Report Status 07/01/2012 FINAL   Final   Organism ID, Bacteria ESCHERICHIA COLI   Final  SURGICAL PCR SCREEN     Status: None   Collection Time    06/30/12  5:38 AM      Result Value Range Status   MRSA, PCR NEGATIVE  NEGATIVE Final   Staphylococcus aureus NEGATIVE  NEGATIVE Final   Comment:            The Xpert SA  Assay (FDA     approved for NASAL specimens     in patients over 77 years of age),     is one component of     a comprehensive surveillance     program.  Test performance has     been validated by [employer] for patients greater     than or equal to 53 year old.     It is not intended     to diagnose infection nor to     guide or monitor treatment.     Studies: Dg Femur Left  06/30/2012   *RADIOLOGY REPORT*  Clinical Data: Left femoral nail placement.  DG C-ARM 1-60 MIN,LEFT FEMUR - 2 VIEW  Fluoroscopic time:  1 minute and 1 second.  Comparison:  06/28/2012  Findings: Four intraoperative C-arm views submitted for review after surgery.  This reveals long stem femoral rod placement with proximal sliding type screw and distal fixation screw for treatment of left intratrochanteric fracture.  No complication noted.  IMPRESSION: Open reduction and internal fixation left intratrochanteric fracture.   Original Report Authenticated By: Lacy Duverney, M.D.   Dg C-arm 1-60 Min  06/30/2012   *RADIOLOGY REPORT*  Clinical Data: Left femoral nail placement.  DG C-ARM 1-60 MIN,LEFT FEMUR - 2 VIEW  Fluoroscopic time:  1 minute and 1 second.  Comparison:  06/28/2012  Findings: Four intraoperative C-arm views submitted for review after surgery.  This reveals long stem femoral rod placement with proximal sliding type screw and distal fixation screw for treatment of left intratrochanteric fracture.  No complication noted.  IMPRESSION: Open reduction and internal fixation left intratrochanteric fracture.   Original Report Authenticated By: Lacy Duverney, M.D.    Scheduled Meds: . amLODipine  5 mg Oral Daily  . calcitRIOL  0.25 mcg Oral Daily  . cefTRIAXone (ROCEPHIN)  IV  1 g Intravenous Q24H  . cholecalciferol  800 Units Oral Daily  . docusate sodium  100 mg Oral BID  . ferrous sulfate  325 mg Oral BID WC  . levothyroxine  75 mcg Oral Daily  . metoprolol tartrate  100 mg Oral BID  . multivitamin with  minerals  1 tablet Oral Daily  . oxybutynin  10 mg Oral Daily  . pantoprazole  40 mg Oral Daily  . polyethylene glycol  17 g Oral  Daily  . sertraline  25 mg Oral Daily  . Warfarin - Pharmacist Dosing Inpatient   Does not apply q1800   Continuous Infusions: . sodium chloride      Principal Problem:   Intertrochanteric fracture of left hip Active Problems:   Atrial fibrillation   HYPERTENSION, BENIGN   PULMONARY HYPERTENSION   End stage renal disease   Hyponatremia   Anemia   Chronic diastolic CHF (congestive heart failure)   Constipation, chronic    Time spent:    CHIU, STEPHEN K  [facility] Pager [phone]. If 7PM-7AM, please contact night-coverage at [email or web address], password [facility] 07/02/2012, 8:14 AM  LOS: 4 days

## 2012-07-02 NOTE — Progress Notes (Signed)
Physical Therapy Treatment Patient Details Name: Cheryl [name] MRN: [id number] DOB: 09-25-1925 Today's Date: 07/02/2012 Time: 4098-1191 PT Time Calculation (min): 20 min  PT Assessment / Plan / Recommendation Comments on Treatment Session  Patient making small progress this afternoon. Per RN not transferring to SNF today and anticipating DC to SNF tomorrow    Follow Up Recommendations  SNF;Supervision/Assistance - 24 hour     Does the patient have the potential to tolerate intense rehabilitation     Barriers to Discharge        Equipment Recommendations  None recommended by PT    Recommendations for Other Services    Frequency Min 3X/week   Plan Discharge plan remains appropriate;Frequency remains appropriate    Precautions / Restrictions Precautions Precautions: Fall Precaution Comments: pt with recent falls  Restrictions Weight Bearing Restrictions: Yes LLE Weight Bearing: Weight bearing as tolerated   Pertinent Vitals/Pain     Mobility  Bed Mobility Bed Mobility: Not assessed Transfers Sit to Stand: 4: Min assist;With upper extremity assist;With armrests;From chair/3-in-1 Stand to Sit: 4: Min assist;With upper extremity assist;With armrests Details for Transfer Assistance: A to ensure balance. Cues for technique and hand placement Ambulation/Gait Ambulation/Gait Assistance: 4: Min assist Ambulation Distance (Feet): 12 Feet Assistive device: Rolling walker Ambulation/Gait Assistance Details: Cues for gait sequence and RW management.  Gait Pattern: Step-to pattern;Decreased step length - left;Decreased step length - right Gait velocity: decreased    Exercises Total Joint Exercises Ankle Circles/Pumps: AROM;Both;10 reps;Supine Short Arc Quad: AAROM;Left;10 reps Heel Slides: AAROM;Left;10 reps Hip ABduction/ADduction: AAROM;Left;10 reps   PT Diagnosis:    PT Problem List:   PT Treatment Interventions:     PT Goals Acute Rehab PT Goals PT Goal:  Supine/Side to Sit - Progress: Progressing toward goal PT Goal: Sit to Stand - Progress: Progressing toward goal PT Goal: Stand to Sit - Progress: Progressing toward goal PT Goal: Ambulate - Progress: Progressing toward goal  Visit Information  Last PT Received On: 07/02/12 Assistance Needed: +1    Subjective Data      Cognition  Cognition Arousal/Alertness: Awake/alert Behavior During Therapy: WFL for tasks assessed/performed Overall Cognitive Status: Within Functional Limits for tasks assessed    Balance     End of Session PT - End of Session Equipment Utilized During Treatment: Gait belt Activity Tolerance: Patient tolerated treatment well Patient left: in chair;with call bell/phone within reach   GP     Fredrich Birks 07/02/2012, 2:31 PM 07/02/2012 Fredrich Birks PTA [phone] pager [phone] office

## 2012-07-02 NOTE — Discharge Summary (Signed)
Physician Discharge Summary  Cheryl [name] WUJ:[id number] DOB: 1925/03/06 DOA: 06/28/2012  PCP: Abigail Miyamoto, MD  Admit date: 06/28/2012 Discharge date: 07/03/2012  Time spent: 30 minutes  Recommendations for Outpatient Follow-up:  1. Follow up INR within one week 2. Repeat serum sodium and potassium levels within one week  Discharge Diagnoses:  Principal Problem:   Intertrochanteric fracture of left hip Active Problems:   Atrial fibrillation   HYPERTENSION, BENIGN   PULMONARY HYPERTENSION   End stage renal disease   Hyponatremia   Anemia   Chronic diastolic CHF (congestive heart failure)   Constipation, chronic   Hyperkalemia   Discharge Condition: Improved  Diet recommendation: Regular  Filed Weights   06/28/12 1300  Weight: 58.06 kg (128 lb)    History of present illness:  Cheryl [name] is a 79 y.o. female with an extensive PMH of HTN, ESRD, CHF, and pulmonary HTN. She presents after fall in her home around 8:30 am where she tripped on a rug. She was unable to move and called out for 30 min prior to help arriving. She noted 10/10 pain in her L hip. She denied dizziness and loss of consciousness; she did not hit her head. She ambulates at home without assistance - denied difficulty walking/balancing prior to fall. She reports no other injury in the fall. She denies SOB, chest pain, N/V, pain outside of her hip. Pt notes sleeping on multiple pillows at night.  She has no prior hospitalizations or significant surgeries. She has a stent placed in her L arm in preparation for dialysis.  Allergies: Codeine, Sulfa abx  Hospital Course:  The patient was admitted to the inpatient service where the patient was noted to have an elevated INR of 3.1. The patient was given one dose of vitamin K. The INR improved and the patient later underwent successful ORIF of the L hip on 07/01/12. Coumadin was then restarted with the INR as of 6/13 noted to be 1.92. Of note, the patient  was noted to have a uti with E.coli species. She was started on rocephin in the hospital with plans to transition to ampicillin as an outpatient. She remains afebrile. Also, the patient was noted to have chronic hyponatremia and has been receiving IVF for this. The patient was also noted to be hypokalemic which was treated and improved with kayexalate.  Procedures: ORIF of L Intertrochanteric Hip on 07/01/12   Consultations:  Orthopedic surgery  Discharge Exam: Filed Vitals:   07/02/12 1400 07/02/12 2311 07/03/12 0500 07/03/12 1003  BP: 141/43 146/54 150/53 118/41  Pulse: 78 63 60 65  Temp: 98.3 F (36.8 C) 97.9 F (36.6 C) 98.9 F (37.2 C)   TempSrc:      Resp: 16 16 16    Height:      Weight:      SpO2: 96% 99% 99%     General: Awake, in NAD Cardiovascular: Regular, s1, s2 Respiratory: normal resp effort, no crackles  Discharge Instructions      Discharge Orders   Future Orders Complete By Expires     Weight bearing as tolerated  As directed         Medication List    TAKE these medications       amLODipine 5 MG tablet  Commonly known as:  NORVASC  Take 5 mg by mouth daily.     ampicillin 500 MG capsule  Commonly known as:  PRINCIPEN  Take 1 capsule (500 mg total) by mouth every 12 (twelve) hours.  calcitRIOL 0.25 MCG capsule  Commonly known as:  ROCALTROL  Take 0.25 mcg by mouth daily.     furosemide 40 MG tablet  Commonly known as:  LASIX  Take 40 mg by mouth daily.     HYDROcodone-acetaminophen 5-325 MG per tablet  Commonly known as:  NORCO  Take 1 tablet by mouth every 6 (six) hours as needed for pain.     levothyroxine 75 MCG tablet  Commonly known as:  SYNTHROID, LEVOTHROID  Take 75 mcg by mouth daily.     lubiprostone 24 MCG capsule  Commonly known as:  AMITIZA  Take 1 capsule (24 mcg total) by mouth 2 (two) times daily with a meal.     Melatonin 5 MG Tabs  Take 5 mg by mouth at bedtime.     metoprolol 100 MG tablet  Commonly known  as:  LOPRESSOR  Take 1 tablet (100 mg total) by mouth 2 (two) times daily.     multivitamin with minerals Tabs  Take 1 tablet by mouth daily. Centrum Silver     omeprazole 20 MG capsule  Commonly known as:  PRILOSEC  Take 20 mg by mouth daily.     oxybutynin 10 MG 24 hr tablet  Commonly known as:  DITROPAN-XL  Take 10 mg by mouth daily.     polyethylene glycol packet  Commonly known as:  MIRALAX / GLYCOLAX  Take 17 g by mouth daily.     sertraline 25 MG tablet  Commonly known as:  ZOLOFT  Take 25 mg by mouth daily.     Vitamin D 400 UNITS capsule  Take 800 Units by mouth daily.     warfarin 3 MG tablet  Commonly known as:  COUMADIN  Take 3 mg by mouth See admin instructions. Take 1 tablet on Sun, Tues, Wed, Thurs, Sat     warfarin 4 MG tablet  Commonly known as:  COUMADIN  Take 4 mg by mouth 2 (two) times a week. Take 1 tablet on Mon and Fri       Allergies  Allergen Reactions  . Codeine Other (See Comments)    unknown  . Sulfa Antibiotics     unknown   Follow-up Information   Follow up with GRAVES,JOHN L, MD. Schedule an appointment as soon as possible for a visit in 2 weeks.   Contact information:   [address] [place] [address] [phone]        The results of significant diagnostics from this hospitalization (including imaging, microbiology, ancillary and laboratory) are listed below for reference.    Significant Diagnostic Studies: Dg Hip Complete Left  06/28/2012   *RADIOLOGY REPORT*  Clinical Data: Larey Seat today, left hip pain.  LEFT HIP - COMPLETE 2+ VIEW  Comparison: None.  Findings: Intertrochanteric fracture of the left hip with slight foreshortening. A component of the fracture extends into the proximal femur/subtrochanteric region towards the medial proximal femur.  No visible pelvic fractures.  IMPRESSION: Intertrochanteric fracture left hip as described.   Original Report Authenticated By: Davonna Belling, M.D.   Dg Femur Left  06/30/2012    *RADIOLOGY REPORT*  Clinical Data: Left femoral nail placement.  DG C-ARM 1-60 MIN,LEFT FEMUR - 2 VIEW  Fluoroscopic time:  1 minute and 1 second.  Comparison:  06/28/2012  Findings: Four intraoperative C-arm views submitted for review after surgery.  This reveals long stem femoral rod placement with proximal sliding type screw and distal fixation screw for treatment of left intratrochanteric fracture.  No  complication noted.  IMPRESSION: Open reduction and internal fixation left intratrochanteric fracture.   Original Report Authenticated By: Lacy Duverney, M.D.   Dg Chest Port 1 View  06/28/2012   *RADIOLOGY REPORT*  Clinical Data: Fall, hip fracture, preop  PORTABLE CHEST - 1 VIEW  Comparison: 11/20/2011  Findings: Cardiomediastinal silhouette is stable.  No acute infiltrate or pleural effusion.  No pulmonary edema.  Stable chronic mild interstitial prominence.  IMPRESSION: No active disease.  No significant change.   Original Report Authenticated By: Natasha Mead, M.D.   Dg C-arm 1-60 Min  06/30/2012   *RADIOLOGY REPORT*  Clinical Data: Left femoral nail placement.  DG C-ARM 1-60 MIN,LEFT FEMUR - 2 VIEW  Fluoroscopic time:  1 minute and 1 second.  Comparison:  06/28/2012  Findings: Four intraoperative C-arm views submitted for review after surgery.  This reveals long stem femoral rod placement with proximal sliding type screw and distal fixation screw for treatment of left intratrochanteric fracture.  No complication noted.  IMPRESSION: Open reduction and internal fixation left intratrochanteric fracture.   Original Report Authenticated By: Lacy Duverney, M.D.    Microbiology: Recent Results (from the past 240 hour(s))  URINE CULTURE     Status: None   Collection Time    06/30/12  5:37 AM      Result Value Range Status   Specimen Description URINE, CATHETERIZED   Final   Special Requests CX ADDED AT 0618 ON [id number]   Final   Culture  Setup Time 06/30/2012 06:22   Final   Colony Count >=100,000  COLONIES/ML   Final   Culture ESCHERICHIA COLI   Final   Report Status 07/01/2012 FINAL   Final   Organism ID, Bacteria ESCHERICHIA COLI   Final  SURGICAL PCR SCREEN     Status: None   Collection Time    06/30/12  5:38 AM      Result Value Range Status   MRSA, PCR NEGATIVE  NEGATIVE Final   Staphylococcus aureus NEGATIVE  NEGATIVE Final   Comment:            The Xpert SA Assay (FDA     approved for NASAL specimens     in patients over 27 years of age),     is one component of     a comprehensive surveillance     program.  Test performance has     been validated by [employer] for patients greater     than or equal to 22 year old.     It is not intended     to diagnose infection nor to     guide or monitor treatment.     Labs: Basic Metabolic Panel:  Recent Labs Lab 06/28/12 1102 06/29/12 0420 07/01/12 0435 07/02/12 0438 07/03/12 0530  NA 130* 130* 125* 123* 126*  K 3.6 4.7 4.5 5.2* 5.9*  CL 88* 91* 90* 88* 91*  CO2 30 28 28 28 29   GLUCOSE 108* 103* 126* 130* 109*  BUN 52* 48* 49* 61* 62*  CREATININE 2.45* 2.14* 2.20* 2.35* 2.14*  CALCIUM 9.8 9.3 8.9 9.1 9.0   Liver Function Tests: No results found for this basename: AST, ALT, ALKPHOS, BILITOT, PROT, ALBUMIN,  in the last 168 hours No results found for this basename: LIPASE, AMYLASE,  in the last 168 hours No results found for this basename: AMMONIA,  in the last 168 hours CBC:  Recent Labs Lab 06/28/12 1102 06/29/12 0420 06/30/12 0415 07/01/12 0435  07/02/12 0438 07/03/12 0530  WBC 9.5 10.2 13.1* 14.8* 17.0* 17.2*  NEUTROABS 7.7  --   --   --   --   --   HGB 13.1 12.0 10.9* 9.1* 8.8* 8.6*  HCT 38.2 35.2* 31.6* 25.7* 24.8* 24.2*  MCV 99.0 99.2 100.0 97.7 96.1 98.0  PLT 224 204 187 186 222 260   Cardiac Enzymes:  Recent Labs Lab 06/28/12 1401  TROPONINI <0.30   BNP: BNP (last 3 results) No results found for this basename: PROBNP,  in the last 8760 hours CBG: No results found for this  basename: GLUCAP,  in the last 168 hours     Signed:  Kayin Kettering K  [facility] 07/03/2012, 12:19 PM

## 2012-07-03 DIAGNOSIS — E875 Hyperkalemia: Secondary | ICD-10-CM | POA: Diagnosis not present

## 2012-07-03 DIAGNOSIS — N390 Urinary tract infection, site not specified: Secondary | ICD-10-CM

## 2012-07-03 LAB — BASIC METABOLIC PANEL
CO2: 29 mEq/L (ref 19–32)
CO2: 27 mEq/L (ref 19–32)
Calcium: 9 mg/dL (ref 8.4–10.5)
Chloride: 91 mEq/L — ABNORMAL LOW (ref 96–112)
GFR calc non Af Amer: 20 mL/min — ABNORMAL LOW (ref >90)
Glucose, Bld: 123 mg/dL — ABNORMAL HIGH (ref 70–99)
Potassium: 5.1 mEq/L (ref 3.5–5.1)
Sodium: 126 mEq/L — ABNORMAL LOW (ref 135–145)
Sodium: 126 mEq/L — ABNORMAL LOW (ref 135–145)

## 2012-07-03 LAB — CBC
HCT: 24.2 % — ABNORMAL LOW (ref 36.0–46.0)
Hemoglobin: 8.6 g/dL — ABNORMAL LOW (ref 12.0–15.0)
WBC: 17.2 10*3/uL — ABNORMAL HIGH (ref 4.0–10.5)

## 2012-07-03 LAB — PROTIME-INR: INR: 2.24 — ABNORMAL HIGH (ref 0.00–1.49)

## 2012-07-03 MED ORDER — SODIUM POLYSTYRENE SULFONATE 15 GM/60ML PO SUSP
30.0000 g | Freq: Once | ORAL | Status: AC
  Administered 2012-07-03: 30 g via ORAL
  Filled 2012-07-03: qty 120

## 2012-07-03 MED ORDER — WARFARIN SODIUM 3 MG PO TABS
3.0000 mg | ORAL_TABLET | Freq: Once | ORAL | Status: AC
  Administered 2012-07-03: 3 mg via ORAL
  Filled 2012-07-03: qty 1

## 2012-07-03 NOTE — Progress Notes (Signed)
Patient for d/c today to SNF bed at  [facility]- Patient and family agreeable to this plan- plan transfer via EMS. Reece Levy, MSW, LCSWA [phone]/weekend coverage

## 2012-07-03 NOTE — Progress Notes (Signed)
ANTICOAGULATION CONSULT NOTE   Pharmacy Consult for Coumadin Indication: atrial fibrillation, VTE prophylaxis and hx PE  Labs:  Recent Labs  07/01/12 0435 07/02/12 0438 07/03/12 0530  HGB 9.1* 8.8* 8.6*  HCT 25.7* 24.8* 24.2*  PLT 186 222 260  LABPROT 22.7* 21.2* 23.8*  INR 2.10* 1.92* 2.24*  CREATININE 2.20* 2.35* 2.14*    Estimated Creatinine Clearance: 15.2 ml/min (by C-G formula based on Cr of 2.14).  Assessment: 79 y/o female on chronic Coumadin for Afib and hx PE. She is now s/p IM nail intertrochanteric fracture. INR is up to 2.2 this morning s/p vitamin K 5 mg PO on 6/10. No bleeding noted, H/H are low post-op but stable, platelets are wnl.  Goal of Therapy:  INR 2-3 Monitor platelets by anticoagulation protocol: Yes   Plan:  -Coumadin 3 mg PO tonight -INR daily -Monitor for signs/symptoms of bleeding  Sheppard Coil PharmD., BCPS Clinical Pharmacist Pager [phone] 07/03/2012 10:50 AM

## 2012-07-08 ENCOUNTER — Non-Acute Institutional Stay (SKILLED_NURSING_FACILITY): Payer: Medicare Other | Admitting: Internal Medicine

## 2012-07-08 DIAGNOSIS — S72142S Displaced intertrochanteric fracture of left femur, sequela: Secondary | ICD-10-CM

## 2012-07-08 DIAGNOSIS — I5032 Chronic diastolic (congestive) heart failure: Secondary | ICD-10-CM

## 2012-07-08 DIAGNOSIS — I480 Paroxysmal atrial fibrillation: Secondary | ICD-10-CM

## 2012-07-08 DIAGNOSIS — I4891 Unspecified atrial fibrillation: Secondary | ICD-10-CM

## 2012-07-08 DIAGNOSIS — I509 Heart failure, unspecified: Secondary | ICD-10-CM

## 2012-07-08 DIAGNOSIS — S72009S Fracture of unspecified part of neck of unspecified femur, sequela: Secondary | ICD-10-CM

## 2012-07-08 DIAGNOSIS — K146 Glossodynia: Secondary | ICD-10-CM

## 2012-07-13 ENCOUNTER — Non-Acute Institutional Stay (SKILLED_NURSING_FACILITY): Payer: Medicare Other | Admitting: Adult Health

## 2012-07-13 DIAGNOSIS — I4891 Unspecified atrial fibrillation: Secondary | ICD-10-CM

## 2012-07-13 DIAGNOSIS — I2699 Other pulmonary embolism without acute cor pulmonale: Secondary | ICD-10-CM

## 2012-07-13 DIAGNOSIS — Z7901 Long term (current) use of anticoagulants: Secondary | ICD-10-CM

## 2012-07-13 DIAGNOSIS — R630 Anorexia: Secondary | ICD-10-CM

## 2012-07-13 DIAGNOSIS — I480 Paroxysmal atrial fibrillation: Secondary | ICD-10-CM

## 2012-07-15 ENCOUNTER — Non-Acute Institutional Stay (SKILLED_NURSING_FACILITY): Payer: Medicare Other | Admitting: Adult Health

## 2012-07-15 DIAGNOSIS — N186 End stage renal disease: Secondary | ICD-10-CM

## 2012-07-15 DIAGNOSIS — I5032 Chronic diastolic (congestive) heart failure: Secondary | ICD-10-CM

## 2012-07-15 DIAGNOSIS — I509 Heart failure, unspecified: Secondary | ICD-10-CM

## 2012-07-16 ENCOUNTER — Non-Acute Institutional Stay (SKILLED_NURSING_FACILITY): Payer: Medicare Other | Admitting: Adult Health

## 2012-07-16 DIAGNOSIS — I4891 Unspecified atrial fibrillation: Secondary | ICD-10-CM

## 2012-07-16 DIAGNOSIS — I5032 Chronic diastolic (congestive) heart failure: Secondary | ICD-10-CM

## 2012-07-16 DIAGNOSIS — Z7901 Long term (current) use of anticoagulants: Secondary | ICD-10-CM

## 2012-07-16 DIAGNOSIS — I509 Heart failure, unspecified: Secondary | ICD-10-CM

## 2012-07-20 ENCOUNTER — Non-Acute Institutional Stay (SKILLED_NURSING_FACILITY): Payer: Medicare Other | Admitting: Adult Health

## 2012-07-20 DIAGNOSIS — I4891 Unspecified atrial fibrillation: Secondary | ICD-10-CM

## 2012-07-20 DIAGNOSIS — Z7901 Long term (current) use of anticoagulants: Secondary | ICD-10-CM

## 2012-07-22 ENCOUNTER — Non-Acute Institutional Stay (SKILLED_NURSING_FACILITY): Payer: Medicare Other | Admitting: Adult Health

## 2012-07-22 DIAGNOSIS — E871 Hypo-osmolality and hyponatremia: Secondary | ICD-10-CM

## 2012-07-22 DIAGNOSIS — I4891 Unspecified atrial fibrillation: Secondary | ICD-10-CM

## 2012-07-22 DIAGNOSIS — K219 Gastro-esophageal reflux disease without esophagitis: Secondary | ICD-10-CM

## 2012-07-22 DIAGNOSIS — Z7901 Long term (current) use of anticoagulants: Secondary | ICD-10-CM

## 2012-07-28 ENCOUNTER — Non-Acute Institutional Stay (SKILLED_NURSING_FACILITY): Payer: Medicare Other | Admitting: Adult Health

## 2012-07-28 ENCOUNTER — Encounter (HOSPITAL_COMMUNITY): Payer: Medicare Other

## 2012-07-28 DIAGNOSIS — I4891 Unspecified atrial fibrillation: Secondary | ICD-10-CM

## 2012-07-28 DIAGNOSIS — Z7901 Long term (current) use of anticoagulants: Secondary | ICD-10-CM

## 2012-08-02 ENCOUNTER — Encounter (HOSPITAL_COMMUNITY)
Admission: RE | Admit: 2012-08-02 | Discharge: 2012-08-02 | Disposition: A | Discharge status: Home / Self Care | Payer: Medicare Other | Source: Ambulatory Visit | Attending: Nephrology | Admitting: Nephrology

## 2012-08-02 DIAGNOSIS — D638 Anemia in other chronic diseases classified elsewhere: Secondary | ICD-10-CM | POA: Insufficient documentation

## 2012-08-02 DIAGNOSIS — N184 Chronic kidney disease, stage 4 (severe): Secondary | ICD-10-CM | POA: Insufficient documentation

## 2012-08-02 LAB — IRON AND TIBC
Saturation Ratios: 18 % — ABNORMAL LOW (ref 20–55)
TIBC: 193 ug/dL — ABNORMAL LOW (ref 250–470)

## 2012-08-02 LAB — POCT HEMOGLOBIN-HEMACUE: Hemoglobin: 8 g/dL — ABNORMAL LOW (ref 12.0–15.0)

## 2012-08-02 MED ORDER — EPOETIN ALFA 10000 UNIT/ML IJ SOLN
INTRAMUSCULAR | Status: AC
  Filled 2012-08-02: qty 1

## 2012-08-02 MED ORDER — EPOETIN ALFA 10000 UNIT/ML IJ SOLN
10000.0000 [IU] | INTRAMUSCULAR | Status: DC
  Administered 2012-08-02: 10000 [IU] via SUBCUTANEOUS

## 2012-08-02 NOTE — Progress Notes (Signed)
[facility] phoned to Arna Medici, Dr. Eliane Decree assistant. Arna Medici will advise MD and follow up as he directs.

## 2012-08-03 NOTE — Progress Notes (Signed)
Patient ID: Cheryl [name], female   DOB: 05/12/1925, 79 y.o.   MRN: [id number]        HISTORY & PHYSICAL  DATE: 07/08/2012   FACILITY: [facility]  LEVEL OF CARE: SNF (31)  ALLERGIES:  Allergies  Allergen Reactions  . Codeine Other (See Comments)    unknown  . Sulfa Antibiotics     unknown    CHIEF COMPLAINT:  Manage left hip intertrochanteric fracture, atrial fibrillation, and CHF.   HISTORY OF PRESENT ILLNESS:  The patient is an 79 year-old, Caucasian female.    HIP FRACTURE: The patient had a mechanical fall and sustained a femur fracture.  Patient subsequently underwent surgical repair and tolerated the procedure well. Patient is admitted to this facility for short-term rehabilitation. Patient denies hip pain currently. No complications reported from the pain medications currently being used.   ATRIAL FIBRILLATION: the patients atrial fibrillation remains stable.  The patient denies DOE, tachycardia, orthopnea, transient neurological sx, pedal edema, palpitations, & PNDs.  No complications noted from the medications currently being used.   CHF:The patient does not relate significant weight changes, denies sob, DOE, orthopnea, PNDs, pedal edema, palpitations or chest pain.  CHF remains stable.  No complications form the medications being used.   PAST MEDICAL HISTORY :  Past Medical History  Diagnosis Date  . HTN (hypertension)     2 YEARS  . Hypothyroidism   . Temporal arteritis   . Pulmonary embolism 1997  . Mitral regurgitation     a. mild by 01/2010 echo.  . Chronic diastolic CHF (congestive heart failure)     a. 01/2010 Echo: EF 60%, mild LVH, High LV filling pressures, No RWMA, mild MR, PASP .  Arman Bogus   . Chronic anemia   . Paroxysmal atrial fibrillation   . Pulmonary HTN     a. PASP by 01/2010 echo.  . S/P cardiac cath     a. 12/2009 R Heart Cath:  RA 12, RV 45/11, PA 47/21, PCWP MEAN 26, CO 4.56, CI 2.7.  . Ischemia     a.  12/2009 Myoview: Ant ischemia, EF 75%----NO CATH DONE DUE TO CKD  . End stage renal disease     a. s/p AVF - not on dialysis.  [name] History of urinary frequency   . GERD (gastroesophageal reflux disease)   . Sleep trouble   . Constipation, chronic   . Hard of hearing   . Complication of anesthesia   . PONV (postoperative nausea and vomiting)     PAST SURGICAL HISTORY: Past Surgical History  Procedure Laterality Date  . Temporal arteny bx    . Cataract surgery    . Eye surgery    . Abdominal hysterectomy    . Intramedullary (im) nail intertrochanteric Left 06/30/2012    Procedure: INTRAMEDULLARY (IM) NAIL INTERTROCHANTRIC LEFT;  Surgeon: Harvie Junior, MD;  Location: [facility] OR;  Service: Orthopedics;  Laterality: Left;  . Injection knee Left 06/30/2012    Procedure: Left Knee Aspiration with Intra-Articular Injection;  Surgeon: Harvie Junior, MD;  Location: [facility] OR;  Service: Orthopedics;  Laterality: Left;    SOCIAL HISTORY:  reports that she has never smoked. She has never used smokeless tobacco. She reports that she does not drink alcohol or use illicit drugs.  FAMILY HISTORY:  Family History  Problem Relation Age of Onset  . Heart attack Mother 71  . Stroke Father 70  . Hypertension Sister   . Other  THERE IS NO EARLY HEART DISEASE  . Cancer Brother     prostate  . Diabetes Daughter   . Hyperlipidemia Daughter   . Hypertension Daughter     CURRENT MEDICATIONS: Reviewed per [facility]  REVIEW OF SYSTEMS:   MOUTH/THROAT:   Complains of tongue pain.   See HPI otherwise 14 point ROS is negative.  PHYSICAL EXAMINATION  VS:  T 97.5       P 58      RR 12      BP 127/58      POX%        WT (Lb)  GENERAL: no acute distress, normal body habitus SKIN: warm & dry, no suspicious lesions or rashes, no excessive dryness EYES: conjunctivae normal, sclerae normal, normal eye lids MOUTH/THROAT: lips without lesions,no lesions in the mouth,tongue is without lesions,uvula elevates in  midline NECK: supple, trachea midline, no neck masses, no thyroid tenderness, no thyromegaly LYMPHATICS: no LAN in the neck, no supraclavicular LAN RESPIRATORY: breathing is even & unlabored, BS CTAB CARDIAC: RRR, no murmur,no extra heart sounds EDEMA/VARICOSITIES: left lower extremity has +2 edema ARTERIAL: pedal pulses +1  GI:  ABDOMEN: abdomen soft, normal BS, no masses, no tenderness  LIVER/SPLEEN: no hepatomegaly, no splenomegaly MUSCULOSKELETAL: HEAD: normal to inspection & palpation BACK: no kyphosis, scoliosis or spinal processes tenderness EXTREMITIES: LEFT UPPER EXTREMITY: full range of motion, normal strength & tone RIGHT UPPER EXTREMITY:  full range of motion, normal strength & tone LEFT LOWER EXTREMITY: strength intact, range of motion not tested due to surgery  RIGHT LOWER EXTREMITY: strength intact, range of motion moderate  PSYCHIATRIC: the patient is alert & oriented to person, affect & behavior appropriate  LABS/RADIOLOGY: Left hip x-ray preoperatively showed intertrochanteric fracture.    Postoperatively, left femur x-ray showed ORIF of the fracture.    Chest x-ray:  No acute disease.    Urine culture grew E.coli.   MRSA by PCR negative.    Staph aureus by PCR negative.   Sodium 123, potassium 5.2, chloride 88, glucose 130, BUN 61, creatinine 2.35, otherwise BMP normal.    WBC 17, hemoglobin 8.8, MCV 96.1, platelets 222.   Troponin-I less than 0.03.   ASSESSMENT/PLAN:  Left hip intertrochanteric fracture.  Status post ORIF.  Continue rehabilitation.   Atrial fibrillation.  Rate controlled.    CHF.  Well compensated.    Tongue pain.  Start Magic Mouthwash b.i.d. for one week.    Hypertension.  Adequately controlled.   UTI.   Currently on ampicillin.    Hypothyroidism.  Continue levothyroxine.    Check CBC and BMP.   I have reviewed patient's medical records received at admission/from hospitalization.  CPT CODE: [address]

## 2012-08-07 ENCOUNTER — Non-Acute Institutional Stay (SKILLED_NURSING_FACILITY): Payer: Medicare Other | Admitting: Adult Health

## 2012-08-07 DIAGNOSIS — I4891 Unspecified atrial fibrillation: Secondary | ICD-10-CM

## 2012-08-07 DIAGNOSIS — D649 Anemia, unspecified: Secondary | ICD-10-CM

## 2012-08-07 DIAGNOSIS — Z7901 Long term (current) use of anticoagulants: Secondary | ICD-10-CM

## 2012-08-09 ENCOUNTER — Non-Acute Institutional Stay (SKILLED_NURSING_FACILITY): Payer: Medicare Other | Admitting: Internal Medicine

## 2012-08-09 ENCOUNTER — Other Ambulatory Visit (HOSPITAL_COMMUNITY): Payer: Self-pay | Admitting: *Deleted

## 2012-08-09 DIAGNOSIS — S72009S Fracture of unspecified part of neck of unspecified femur, sequela: Secondary | ICD-10-CM

## 2012-08-09 DIAGNOSIS — D689 Coagulation defect, unspecified: Secondary | ICD-10-CM

## 2012-08-09 DIAGNOSIS — S72142S Displaced intertrochanteric fracture of left femur, sequela: Secondary | ICD-10-CM

## 2012-08-09 DIAGNOSIS — R040 Epistaxis: Secondary | ICD-10-CM

## 2012-08-09 DIAGNOSIS — J309 Allergic rhinitis, unspecified: Secondary | ICD-10-CM

## 2012-08-10 ENCOUNTER — Encounter (HOSPITAL_COMMUNITY)
Admission: RE | Admit: 2012-08-10 | Discharge: 2012-08-10 | Disposition: A | Discharge status: Home / Self Care | Payer: Medicare Other | Source: Ambulatory Visit | Attending: Nephrology | Admitting: Nephrology

## 2012-08-10 ENCOUNTER — Ambulatory Visit: Payer: Medicare Other | Admitting: Nurse Practitioner

## 2012-08-10 MED ORDER — EPOETIN ALFA 20000 UNIT/ML IJ SOLN
INTRAMUSCULAR | Status: AC
  Administered 2012-08-10: 20000 [IU] via SUBCUTANEOUS
  Filled 2012-08-10: qty 1

## 2012-08-10 MED ORDER — EPOETIN ALFA 40000 UNIT/ML IJ SOLN: 30000.0000 [IU] | INTRAMUSCULAR | Status: DC

## 2012-08-10 MED ORDER — EPOETIN ALFA 10000 UNIT/ML IJ SOLN
INTRAMUSCULAR | Status: AC
  Administered 2012-08-10: 10000 [IU] via SUBCUTANEOUS
  Filled 2012-08-10: qty 1

## 2012-08-16 DIAGNOSIS — I509 Heart failure, unspecified: Secondary | ICD-10-CM

## 2012-08-16 DIAGNOSIS — S72009D Fracture of unspecified part of neck of unspecified femur, subsequent encounter for closed fracture with routine healing: Secondary | ICD-10-CM

## 2012-08-16 DIAGNOSIS — I4891 Unspecified atrial fibrillation: Secondary | ICD-10-CM

## 2012-08-16 DIAGNOSIS — I5030 Unspecified diastolic (congestive) heart failure: Secondary | ICD-10-CM

## 2012-08-17 ENCOUNTER — Encounter (HOSPITAL_COMMUNITY)
Admission: RE | Admit: 2012-08-17 | Discharge: 2012-08-17 | Disposition: A | Discharge status: Home / Self Care | Payer: Medicare Other | Source: Ambulatory Visit | Attending: Nephrology | Admitting: Nephrology

## 2012-08-17 LAB — POCT HEMOGLOBIN-HEMACUE: Hemoglobin: 8.7 g/dL — ABNORMAL LOW (ref 12.0–15.0)

## 2012-08-17 MED ORDER — EPOETIN ALFA 10000 UNIT/ML IJ SOLN
INTRAMUSCULAR | Status: AC
  Administered 2012-08-17: 10000 [IU] via SUBCUTANEOUS
  Filled 2012-08-17: qty 1

## 2012-08-17 MED ORDER — EPOETIN ALFA 20000 UNIT/ML IJ SOLN
INTRAMUSCULAR | Status: AC
  Administered 2012-08-17: 20000 [IU] via SUBCUTANEOUS
  Filled 2012-08-17: qty 1

## 2012-08-17 MED ORDER — EPOETIN ALFA 40000 UNIT/ML IJ SOLN: 30000.0000 [IU] | INTRAMUSCULAR | Status: DC

## 2012-08-23 ENCOUNTER — Other Ambulatory Visit (HOSPITAL_COMMUNITY): Payer: Self-pay | Admitting: *Deleted

## 2012-08-24 ENCOUNTER — Encounter (HOSPITAL_COMMUNITY)
Admission: RE | Admit: 2012-08-24 | Discharge: 2012-08-24 | Disposition: A | Discharge status: Home / Self Care | Payer: Medicare Other | Source: Ambulatory Visit | Attending: Nephrology | Admitting: Nephrology

## 2012-08-24 DIAGNOSIS — D638 Anemia in other chronic diseases classified elsewhere: Secondary | ICD-10-CM | POA: Insufficient documentation

## 2012-08-24 DIAGNOSIS — N184 Chronic kidney disease, stage 4 (severe): Secondary | ICD-10-CM | POA: Insufficient documentation

## 2012-08-24 LAB — RENAL FUNCTION PANEL
Calcium: 8.8 mg/dL (ref 8.4–10.5)
Chloride: 85 mEq/L — ABNORMAL LOW (ref 96–112)
GFR calc Af Amer: 23 mL/min — ABNORMAL LOW (ref >90)
Phosphorus: 4.7 mg/dL — ABNORMAL HIGH (ref 2.3–4.6)
Potassium: 4.2 mEq/L (ref 3.5–5.1)
Sodium: 123 mEq/L — ABNORMAL LOW (ref 135–145)

## 2012-08-24 MED ORDER — EPOETIN ALFA 20000 UNIT/ML IJ SOLN
INTRAMUSCULAR | Status: AC
  Administered 2012-08-24: 20000 [IU] via SUBCUTANEOUS
  Filled 2012-08-24: qty 1

## 2012-08-24 MED ORDER — EPOETIN ALFA 10000 UNIT/ML IJ SOLN
INTRAMUSCULAR | Status: AC
  Administered 2012-08-24: 10000 [IU] via SUBCUTANEOUS
  Filled 2012-08-24: qty 1

## 2012-08-24 MED ORDER — EPOETIN ALFA 40000 UNIT/ML IJ SOLN: 30000.0000 [IU] | INTRAMUSCULAR | Status: DC

## 2012-08-25 ENCOUNTER — Ambulatory Visit (INDEPENDENT_AMBULATORY_CARE_PROVIDER_SITE_OTHER): Payer: Medicare Other | Admitting: Nurse Practitioner

## 2012-08-25 ENCOUNTER — Encounter: Payer: Self-pay | Admitting: Nurse Practitioner

## 2012-08-25 DIAGNOSIS — R0989 Other specified symptoms and signs involving the circulatory and respiratory systems: Secondary | ICD-10-CM

## 2012-08-25 NOTE — Progress Notes (Signed)
Cheryl [name] Date of Birth: 1925/09/09 Medical Record #[id number]  History of Present Illness: Mr. Mccauley is seen back today for a 6 month check. Seen for Dr. Antoine Poche. Has ESRD, HTN, hypothyroidism, temporal arteritis, past PE, mild MR, diastolic HF, PAF, pulmonary HTN, and GERD.  She has a h/o an abnormal myoview in 12/2009 with anterior ischemia, though cath was not performed 2/2 CKD/ESRD.   Last seen here in January - had had her fistula placed but not on dialysis yet - she was felt to be stable.   Fell back in June and broke her hip.   Comes back today. Daughter brought her. Did not know why they were here. Reportedly with no problems and did NOT wish to be seen today. Was to be back here in January of 2015.    Current Outpatient Prescriptions  Medication Sig Dispense Refill  . amLODipine (NORVASC) 5 MG tablet Take 5 mg by mouth daily.        [name] ampicillin (PRINCIPEN) 500 MG capsule Take 1 capsule (500 mg total) by mouth every 12 (twelve) hours.  10 capsule  0  . calcitRIOL (ROCALTROL) 0.25 MCG capsule Take 0.25 mcg by mouth daily.      . Cholecalciferol (VITAMIN D) 400 UNITS capsule Take 800 Units by mouth daily.       . furosemide (LASIX) 40 MG tablet Take 40 mg by mouth daily.      [name] HYDROcodone-acetaminophen (NORCO) 5-325 MG per tablet Take 1 tablet by mouth every 6 (six) hours as needed for pain.  40 tablet  0  . levothyroxine (SYNTHROID, LEVOTHROID) 75 MCG tablet Take 75 mcg by mouth daily.        [name] lubiprostone (AMITIZA) 24 MCG capsule Take 1 capsule (24 mcg total) by mouth 2 (two) times daily with a meal.  60 capsule  0  . Melatonin 5 MG TABS Take 5 mg by mouth at bedtime.       . metoprolol (LOPRESSOR) 100 MG tablet Take 1 tablet (100 mg total) by mouth 2 (two) times daily.  60 tablet  11  . Multiple Vitamin (MULTIVITAMIN WITH MINERALS) TABS Take 1 tablet by mouth daily. Centrum Silver      . omeprazole (PRILOSEC) 20 MG capsule Take 20 mg by mouth daily.        [name]  oxybutynin (DITROPAN-XL) 10 MG 24 hr tablet Take 10 mg by mouth daily.      . polyethylene glycol (MIRALAX / GLYCOLAX) packet Take 17 g by mouth daily.      . sertraline (ZOLOFT) 25 MG tablet Take 25 mg by mouth daily.      [name] warfarin (COUMADIN) 3 MG tablet Take 3 mg by mouth See admin instructions. Take 1 tablet on Sun, Tues, Wed, Thurs, Sat      . warfarin (COUMADIN) 4 MG tablet Take 4 mg by mouth 2 (two) times a week. Take 1 tablet on Mon and Fri       No current facility-administered medications for this visit.    Allergies  Allergen Reactions  . Codeine Other (See Comments)    unknown  . Sulfa Antibiotics     unknown    Past Medical History  Diagnosis Date  . HTN (hypertension)     2 YEARS  . Hypothyroidism   . Temporal arteritis   . Pulmonary embolism 1997  . Mitral regurgitation     a. mild by 01/2010 echo.  . Chronic diastolic CHF (congestive heart failure)  a. 01/2010 Echo: EF 60%, mild LVH, High LV filling pressures, No RWMA, mild MR, PASP .  Arman Bogus   . Chronic anemia   . Paroxysmal atrial fibrillation   . Pulmonary HTN     a. PASP by 01/2010 echo.  . S/P cardiac cath     a. 12/2009 R Heart Cath:  RA 12, RV 45/11, PA 47/21, PCWP MEAN 26, CO 4.56, CI 2.7.  . Ischemia     a. 12/2009 Myoview: Ant ischemia, EF 75%----NO CATH DONE DUE TO CKD  . End stage renal disease     a. s/p AVF - not on dialysis.  [name] History of urinary frequency   . GERD (gastroesophageal reflux disease)   . Sleep trouble   . Constipation, chronic   . Hard of hearing   . Complication of anesthesia   . PONV (postoperative nausea and vomiting)     Past Surgical History  Procedure Laterality Date  . Temporal arteny bx    . Cataract surgery    . Eye surgery    . Abdominal hysterectomy    . Intramedullary (im) nail intertrochanteric Left 06/30/2012    Procedure: INTRAMEDULLARY (IM) NAIL INTERTROCHANTRIC LEFT;  Surgeon: Harvie Junior, MD;  Location: [facility] OR;  Service:  Orthopedics;  Laterality: Left;  . Injection knee Left 06/30/2012    Procedure: Left Knee Aspiration with Intra-Articular Injection;  Surgeon: Harvie Junior, MD;  Location: [facility] OR;  Service: Orthopedics;  Laterality: Left;    History  Smoking status  . Never Smoker   Smokeless tobacco  . Never Used    History  Alcohol Use No    Family History  Problem Relation Age of Onset  . Heart attack Mother 78  . Stroke Father 24  . Hypertension Sister   . Other      THERE IS NO EARLY HEART DISEASE  . Cancer Brother     prostate  . Diabetes Daughter   . Hyperlipidemia Daughter   . Hypertension Daughter     Review of Systems: Not obtained.   Physical Exam: There were no vitals taken for this visit.  LABORATORY DATA:  Lab Results  Component Value Date   WBC 17.2* 07/03/2012   HGB 9.2* 08/24/2012   HCT 24.2* 07/03/2012   PLT 260 07/03/2012   GLUCOSE 126* 08/24/2012   ALT 15 11/20/2011   AST 51* 11/20/2011   NA 123* 08/24/2012   K 4.2 08/24/2012   CL 85* 08/24/2012   CREATININE 2.14* 08/24/2012   BUN 47* 08/24/2012   CO2 28 08/24/2012   TSH 0.812 11/20/2011   INR 2.24* 07/03/2012   Lab Results  Component Value Date   INR 2.24* 07/03/2012   INR 1.92* 07/02/2012   INR 2.10* 07/01/2012     Assessment / Plan: Patient left without being seen. Was to be seen in January of 2015 per Dr. Jenene Slicker last note. They have expressed no current issues that needed to be addressed and did not wish to be seen here today by me.   Rosalio Macadamia, RN, ANP-C [facility] [address] [place], [place]  [address]

## 2012-08-26 ENCOUNTER — Encounter: Payer: Self-pay | Admitting: Adult Health

## 2012-08-26 DIAGNOSIS — R630 Anorexia: Secondary | ICD-10-CM | POA: Insufficient documentation

## 2012-08-26 DIAGNOSIS — I2699 Other pulmonary embolism without acute cor pulmonale: Secondary | ICD-10-CM | POA: Insufficient documentation

## 2012-08-26 DIAGNOSIS — Z7901 Long term (current) use of anticoagulants: Secondary | ICD-10-CM | POA: Insufficient documentation

## 2012-08-26 DIAGNOSIS — N186 End stage renal disease: Secondary | ICD-10-CM | POA: Insufficient documentation

## 2012-08-26 NOTE — Progress Notes (Signed)
Patient ID: Cheryl [name], female   DOB: 02-20-1925, 79 y.o.   MRN: [id number]       PROGRESS NOTE  DATE: 07/13/2012  FACILITY:  [facility]  LEVEL OF CARE: SNF (31)  Acute Visit  CHIEF COMPLAINT:  Manage Coumadin therapy and poor appetite  HISTORY OF PRESENT ILLNESS: This is an 79 year old female who had a significant weight loss of 4.99 percent in 5 days. She is currently on antibiotic for UTI, diuretic for CHF and a recent status post ORIF of left intertrochanteric hip fracture. Latest INR is 3.5 - supratherapeutic. No bleeding nor bruising noted. She is on chronic Coumadin therapy due to DVT prophylaxis , paroxysmal atrial fibrillation and history of pulmonary embolism.   PAST MEDICAL HISTORY : Reviewed.  No changes.  CURRENT MEDICATIONS: Reviewed per [facility]  REVIEW OF SYSTEMS:  GENERAL: no change in appetite, no fatigue, no weight changes, no fever, chills or weakness RESPIRATORY: no cough, SOB, DOE,, wheezing, hemoptysis CARDIAC: no chest pain,or palpitations, + edema GI: no abdominal pain, diarrhea, constipation, heart burn, nausea or vomiting  PHYSICAL EXAMINATION  VS:  T 96.7        P 68       RR 16       BP 136/52            WT 133.2 (Lb)  GENERAL: no acute distress, normal body habitus EYES: conjunctivae normal, sclerae normal, normal eye lids NECK: supple, trachea midline, no neck masses, no thyroid tenderness, no thyromegaly RESPIRATORY: breathing is even & unlabored, BS CTAB CARDIAC: RRR, no murmur,no extra heart sounds, no edema GI: abdomen soft, normal BS, no masses, no tenderness, no hepatomegaly, no splenomegaly PSYCHIATRIC: the patient is alert & oriented to person, affect & behavior appropriate  LABS/RADIOLOGY: 07/05/12 WBC 14.5 hemoglobin 8.7 hematocrit 25.5 sodium 129 potassium 4.7 glucose 108 BUN 49 creatinine 1.62 calcium 8.7   ASSESSMENT/PLAN:  Long-term use of anticoagulant - hold Coumadin and repeat INR on 07/14/12  Poor  appetite - start Eldertonic 15 mL by mouth twice a day   Paroxysmal atrial fibrillation - Rate controlled  Pulmonary embolism, history - stable  CPT CODE: 40981

## 2012-08-26 NOTE — Progress Notes (Signed)
Patient ID: Cheryl [name], female   DOB: 10-05-1925, 79 y.o.   MRN: [id number]       PROGRESS NOTE  DATE: 07/15/2012  FACILITY:  [facility]  LEVEL OF CARE: SNF (31)  Acute Visit  CHIEF COMPLAINT:  Manage CHF and ESRD   HISTORY OF PRESENT ILLNESS:   This is an 79 year old female who was noted to have creatinine 2.10 - elevated (07/05/12 creatinine 1.62). She is currently taking Lasix for congestive heart failure. Patient has end-stage renal disease diagnosis. No shortness of breath noted and + for bipedal edema.   PAST MEDICAL HISTORY : Reviewed.  No changes.  CURRENT MEDICATIONS: Reviewed per [facility]  REVIEW OF SYSTEMS:  GENERAL: no change in appetite, no fatigue, no weight changes, no fever, chills or weakness RESPIRATORY: no cough, SOB, DOE,, wheezing, hemoptysis CARDIAC: no chest pain, edema or palpitations GI: no abdominal pain, diarrhea, constipation, heart burn, nausea or vomiting  PHYSICAL EXAMINATION  VS:  T 97.7       P 61       RR 16       BP 121/54            WT 133.2  (Lb)  GENERAL: no acute distress, normal body habitus EYES: conjunctivae normal, sclerae normal, normal eye lids NECK: supple, trachea midline, no neck masses, no thyroid tenderness, no thyromegaly RESPIRATORY: breathing is even & unlabored, BS CTAB CARDIAC: RRR, no murmur,no extra heart sounds,  Edema on BLE, 2+ GI: abdomen soft, normal BS, no masses, no tenderness, no hepatomegaly, no splenomegaly PSYCHIATRIC: the patient is alert & oriented to person, affect & behavior appropriate  LABS/RADIOLOGY: 07/05/12 WBC 14.5 hemoglobin 8.7 hematocrit 25.5 sodium 129 potassium 4.7 glucose 108 BUN 49 creatinine 1.62 calcium 8.7   ASSESSMENT/PLAN:   CHF - check BNP  ESRD - decrease Lasix to 20 mg one tab by mouth daily  CPT CODE: 65784

## 2012-08-27 ENCOUNTER — Encounter: Payer: Self-pay | Admitting: Adult Health

## 2012-08-27 NOTE — Progress Notes (Signed)
Patient ID: Cheryl [name], female   DOB: 24-Apr-1925, 79 y.o.   MRN: [id number] Subjective:     Indication: atrial fibrillation Bleeding signs/symptoms: None Thromboembolic signs/symptoms: None  Missed Coumadin doses: None Medication changes: no Dietary changes: no Bacterial/viral infection: no Other concerns: no  The following portions of the patient's history were reviewed and updated as appropriate: allergies, current medications, past family history, past medical history, past social history, past surgical history and problem list.  Review of Systems A comprehensive review of systems was negative.   Objective:    INR Today: 1.8 Current dose: Coumadin 1.5 mg by mouth daily  Assessment:    Subtherapeutic INR for goal of 2-3   Plan:    1. New dose: Increase Coumadin to 2 mg by mouth daily   2. Next INR:   07/30/12

## 2012-08-27 NOTE — Progress Notes (Deleted)
Patient ID: [name], female   DOB: 06-May-1925, 79 y.o.   MRN: [id number] Subjective:     Indication: atrial fibrillation Bleeding signs/symptoms: None Thromboembolic signs/symptoms: None  Missed Coumadin doses: None Medication changes: no Dietary changes: no Bacterial/viral infection: no Other concerns: no    Review of Systems A comprehensive review of systems was negative.   Objective:    INR Today: *** Current dose: ***   Assessment:    {therapeutic/sub/supra:12803} INR for goal of {2-3/2.5/3.5/3-4:12804}   Plan:    1. New dose: {no change:13088}   2. Next INR: {time frame/weeks 1-4:12805}

## 2012-08-27 NOTE — Progress Notes (Signed)
Patient ID: Cheryl [name], female   DOB: [birth year], 79 y.o.   MRN: [id number]       PROGRESS NOTE  DATE: 07/22/2012  FACILITY:  [facility]  LEVEL OF CARE: SNF (31)  Acute Visit  CHIEF COMPLAINT:  Manage GERD, Coumadin therapy and Hyponatremia  HISTORY OF PRESENT ILLNESS: This is an 79 year old female who complains of being nauseous before eating. It was noted that her sodium = 123 - low . Lasix was held x3 days due to hyponatremia (07/15/11 NA 123) Latest INR = 3.6 - supratherapeutic. No bruising or bleeding noted. She is on chronic Coumadin therapy due to atrial fibrillation.   PAST MEDICAL HISTORY : Reviewed.  No changes.  CURRENT MEDICATIONS: Reviewed per [facility]  REVIEW OF SYSTEMS:  GENERAL: no change in appetite, no fatigue, no weight changes, no fever, chills or weakness RESPIRATORY: no cough, SOB, DOE,, wheezing, hemoptysis CARDIAC: no chest pain, edema or palpitations GI: no abdominal pain, diarrhea, constipation, heart burn, + nausea & vomiting  PHYSICAL EXAMINATION  VS:  T 97.4        P 67       RR 14       BP 122/60            WT 137.6 (Lb)  GENERAL: no acute distress, normal body habitus NECK: supple, trachea midline, no neck masses, no thyroid tenderness, no thyromegaly LYMPHATICS: no LAN in the neck, no supraclavicular LAN RESPIRATORY: breathing is even & unlabored, BS CTAB CARDIAC: RRR, no murmur,no extra heart sounds, BLE edema, 2+ GI: abdomen soft, normal BS, no masses, no tenderness, no hepatomegaly, no splenomegaly PSYCHIATRIC: the patient is alert & oriented to person, affect & behavior appropriate  LABS/RADIOLOGY: 07/22/12 sodium 123 potassium 4.7 and glucose 108 BUN 48 creatinine 1.95 calcium 9.1 TSH 3.629 07/19/12 sodium 123 potassium 5.3 glucose 94 BUN 48 creatinine 2.07 calcium 8.6 07/16/12  BNP 644.7 07/14/12 WBC 9.8 hemoglobin 9.2 hematocrit 26.0 sodium 127 potassium 5.2 Glucose 100 BUN 46 creatinine 2.10 calcium 9.4 07/05/12 WBC 14.5  hemoglobin 8.7 hematocrit 25.5 sodium 129 potassium 4.7 glucose 108 BUN 49 creatinine 1.62 calcium 8.7   ASSESSMENT/PLAN:  GERD - start Reglan 5 mg 1 tab by mouth 3 times a day a.c. x1 week  Long-term use of anticoagulant - hold Coumadin and repeat INR on 07/23/12  Hyponatremia - start 0.9 NS at 70 cc/H. x1 L; BMP in a.m.  Atrial fibrillation - rate controlled   CPT CODE: 40981

## 2012-08-27 NOTE — Progress Notes (Signed)
Patient ID: Cheryl [name], female   DOB: 21-Aug-1925, 79 y.o.   MRN: [id number]       PROGRESS NOTE  DATE: 07/16/2012  FACILITY:  [facility]  LEVEL OF CARE: SNF (31)  Acute Visit  CHIEF COMPLAINT:  Manage CHF and Coumadin Therapy  HISTORY OF PRESENT ILLNESS: This is an 79 year old female ho has INR = 2.8 - therapeutic. No complaints of chest pain nor shortness of breath. She is currently on chronic Coumadin therapy. 2 paroxysmal atrial fibrillation. Latest BNP 644.7 - elevated. Lasix was decreased yesterday to 2 elevation in creatinine 2.10 .  PAST MEDICAL HISTORY : Reviewed.  No changes.  CURRENT MEDICATIONS: Reviewed per [facility]  REVIEW OF SYSTEMS:  GENERAL: no change in appetite, no fatigue, no weight changes, no fever, chills or weakness RESPIRATORY: no cough, SOB, DOE,, wheezing, hemoptysis CARDIAC: no chest pain, or palpitations GI: no abdominal pain, diarrhea, constipation, heart burn, nausea or vomiting  PHYSICAL EXAMINATION  VS:  T 97.5        P 63       RR 18       BP 120/56            WT 133.2 (Lb)  GENERAL: no acute distress, normal body habitus EYES: conjunctivae normal, sclerae normal, normal eye lids LYMPHATICS: no LAN in the neck, no supraclavicular LAN RESPIRATORY: breathing is even & unlabored, BS CTAB CARDIAC: RRR, no murmur,no extra heart sounds, bilateral lower extremity edema, 2+ GI: abdomen soft, normal BS, no masses, no tenderness, no hepatomegaly, no splenomegaly PSYCHIATRIC: the patient is alert & oriented to person, affect & behavior appropriate  LABS/RADIOLOGY: 07/16/12  BNP 644.7 07/14/12 WBC 9.8 hemoglobin 9.2 hematocrit 26.0 sodium 127 potassium 5.2 Glucose 100 BUN 46 creatinine 2.10 calcium 9.4 07/05/12 WBC 14.5 hemoglobin 8.7 hematocrit 25.5 sodium 129 potassium 4.7 glucose 108 BUN 49 creatinine 1.62 calcium 8.7   ASSESSMENT/PLAN:  Chronic diastolic CHF - increase Lasix to 40 mg once by mouth daily; BMP on 08/18/12; weight 3  times/week; notify M.D./NP +- 5 pounds   Atrial fibrillation - rate controlled  Long-term use of anticoagulants - continue Coumadin 2.5 mg by mouth daily; repeat INR on 07/20/12     CPT CODE: 78295

## 2012-08-27 NOTE — Progress Notes (Signed)
Patient ID: Cheryl [name], female   DOB: Jun 06, 1925, 79 y.o.   MRN: [id number]    Indication: atrial fibrillation Bleeding signs/symptoms: None Thromboembolic signs/symptoms: None  Missed Coumadin doses: None Medication changes: no Dietary changes: no Bacterial/viral infection: no Other concerns: no   Review of Systems A comprehensive review of systems was negative.    INR Today: 3.0 Current dose: Coumadin 2.5 mg by mouth daily   Therapeutic INR for goal of 2-3    1. New dose: decrease Coumadin to 2 mg 1 tab PO Q D   2. Next INR: 07/22/12

## 2012-08-27 NOTE — Progress Notes (Signed)
Patient ID: Cheryl [name], female   DOB: 10/16/1925, 79 y.o.   MRN: [id number]        PROGRESS NOTE  DATE: 08/07/2012  FACILITY:  [facility]  LEVEL OF CARE: SNF (31)  Acute Visit  CHIEF COMPLAINT:  Manage Anemia and Coumadin Therapy  HISTORY OF PRESENT ILLNESS:  This is an 79 year old female who has hemoglobin 7.9 (07/14/12 hemoglobin 9.2). Stool occult done was positive. She had a fall and sustained in intertrochanteric fracture status post IM nail intertrochanteric left. No complaints of shortness of breath nor dizziness. Latest INR 2.8 - therapeutic. She is on chronic Coumadin therapy due to atrial fibrillation.  PAST MEDICAL HISTORY : Reviewed.  No changes.  CURRENT MEDICATIONS: Reviewed per [facility]  REVIEW OF SYSTEMS:  GENERAL: no change in appetite, no fatigue, no weight changes, no fever, chills or weakness RESPIRATORY: no cough, SOB, DOE,, wheezing, hemoptysis CARDIAC: no chest pain,  Palpitations, + edema GI: no abdominal pain, diarrhea, constipation, heart burn, nausea or vomiting  PHYSICAL EXAMINATION  VS:  T 98.4        P 66       RR 18       BP 128/52      POX 95 %       WT 136.6 (Lb)  GENERAL: no acute distress, normal body habitus EYES: conjunctivae normal, sclerae normal, normal eye lids RESPIRATORY: breathing is even & unlabored, BS CTAB CARDIAC: RRR, no murmur,no extra heart sounds, BLE edema, 2+ GI: abdomen soft, normal BS, no masses, no tenderness, no hepatomegaly, no splenomegaly PSYCHIATRIC: the patient is alert & oriented to person, affect & behavior appropriate  LABS/RADIOLOGY: 08/05/12 WBC 5.7 hemoglobin 7.9 hematocrit 22.5 iron 40 vitamin B12 944 folate greater than 20.0 08/03/12 sodium 125-4.4 glucose 102 BUN 46 creatinine 1.75 07/22/12 sodium 123 potassium 4.7 and glucose 108 BUN 48 creatinine 1.95 calcium 9.1 TSH 3.629 07/19/12 sodium 123 potassium 5.3 glucose 94 BUN 48 creatinine 2.07 calcium 8.6 07/16/12  BNP 644.7 07/14/12 WBC 9.8  hemoglobin 9.2 hematocrit 26.0 sodium 127 potassium 5.2 Glucose 100 BUN 46 creatinine 2.10 calcium 9.4 07/05/12 WBC 14.5 hemoglobin 8.7 hematocrit 25.5 sodium 129 potassium 4.7 glucose 108 BUN 49 creatinine 1.62 calcium 8.7  ASSESSMENT/PLAN:  Atrial fibrillation - rate controlled  Anemia - start ferrous sulfate 325 mg 1 tab by mouth twice a day; GI consult  Long-term use of anticoagulant - continue Coumadin 2.5 mg by mouth daily and repeat INR on 08/10/12   CPT CODE: 78295

## 2012-08-31 ENCOUNTER — Encounter (HOSPITAL_COMMUNITY)
Admission: RE | Admit: 2012-08-31 | Discharge: 2012-08-31 | Disposition: A | Discharge status: Home / Self Care | Payer: Medicare Other | Source: Ambulatory Visit | Attending: Nephrology | Admitting: Nephrology

## 2012-08-31 LAB — IRON AND TIBC
Iron: 37 ug/dL — ABNORMAL LOW (ref 42–135)
Saturation Ratios: 23 % (ref 20–55)
TIBC: 163 ug/dL — ABNORMAL LOW (ref 250–470)

## 2012-08-31 LAB — FERRITIN: Ferritin: 1001 ng/mL — ABNORMAL HIGH (ref 10–291)

## 2012-08-31 LAB — HEPATITIS B SURFACE ANTIGEN: Hepatitis B Surface Ag: NEGATIVE

## 2012-08-31 MED ORDER — EPOETIN ALFA 40000 UNIT/ML IJ SOLN
30000.0000 [IU] | INTRAMUSCULAR | Status: DC
Start: 2012-08-31 — End: 2012-09-01

## 2012-08-31 MED ORDER — EPOETIN ALFA 20000 UNIT/ML IJ SOLN
INTRAMUSCULAR | Status: AC
  Administered 2012-08-31: 20000 [IU] via SUBCUTANEOUS
  Filled 2012-08-31: qty 1

## 2012-08-31 MED ORDER — EPOETIN ALFA 10000 UNIT/ML IJ SOLN
INTRAMUSCULAR | Status: AC
  Administered 2012-08-31: 10000 [IU] via SUBCUTANEOUS
  Filled 2012-08-31: qty 1

## 2012-09-01 LAB — VITAMIN D 25 HYDROXY (VIT D DEFICIENCY, FRACTURES): Vit D, 25-Hydroxy: 72 ng/mL (ref 30–89)

## 2012-09-07 ENCOUNTER — Encounter (HOSPITAL_COMMUNITY)
Admission: RE | Admit: 2012-09-07 | Discharge: 2012-09-07 | Disposition: A | Discharge status: Home / Self Care | Payer: Medicare Other | Source: Ambulatory Visit | Attending: Nephrology | Admitting: Nephrology

## 2012-09-07 DIAGNOSIS — J309 Allergic rhinitis, unspecified: Secondary | ICD-10-CM | POA: Insufficient documentation

## 2012-09-07 DIAGNOSIS — D689 Coagulation defect, unspecified: Secondary | ICD-10-CM | POA: Insufficient documentation

## 2012-09-07 DIAGNOSIS — R040 Epistaxis: Secondary | ICD-10-CM | POA: Insufficient documentation

## 2012-09-07 MED ORDER — EPOETIN ALFA 20000 UNIT/ML IJ SOLN
INTRAMUSCULAR | Status: AC
  Administered 2012-09-07: 20000 [IU] via SUBCUTANEOUS
  Filled 2012-09-07: qty 1

## 2012-09-07 MED ORDER — EPOETIN ALFA 10000 UNIT/ML IJ SOLN
INTRAMUSCULAR | Status: AC
  Administered 2012-09-07: 10000 [IU] via SUBCUTANEOUS
  Filled 2012-09-07: qty 1

## 2012-09-07 MED ORDER — EPOETIN ALFA 40000 UNIT/ML IJ SOLN: 30000.0000 [IU] | INTRAMUSCULAR | Status: DC

## 2012-09-07 NOTE — Progress Notes (Signed)
Patient ID: Cheryl [name], female   DOB: 11-01-1925, 79 y.o.   MRN: [id number]        PROGRESS NOTE  DATE: 08/09/2012   FACILITY: [facility]  LEVEL OF CARE: SNF (31)  Discharge Visit  CHIEF COMPLAINT:  Manage left hip fracture, coagulopathy, and postnasal discharge.    HISTORY OF PRESENT ILLNESS: I was requested by the social worker to perform face-to-face evaluation for discharge:  Patient was admitted to this facility for short-term rehabilitation after the patient's recent hospitalization.  Patient has completed SNF rehabilitation and therapy has cleared the patient for discharge.  Reassessment of ongoing problem(s):  HIP FRACTURE: The patient had a mechanical fall and sustained a femur fracture.  Patient subsequently underwent surgical repair and tolerated the procedure well. Patient is admitted to this facility for short-term rehabilitation. Patient denies hip pain currently. No complications reported from the pain medications currently being used.   COAGULOPATHY:  The patient was having nosebleeds yesterday, which is new.  She is on Coumadin.  INR today is 3.8.  She denies any nosebleeds this morning.    POSTNASAL DRIP:  Patient is complaining of postnasal drip and a thick nasal yellow-colored discharge.  She cannot identify precipitating or alleviating factors.  There are no other associated signs and symptoms.  There is no temporal relationship.    PAST MEDICAL HISTORY : Reviewed.  No changes.  CURRENT MEDICATIONS: Reviewed per [facility]  REVIEW OF SYSTEMS:  GENERAL: no change in appetite, no fatigue, no weight changes, no fever, chills or weakness RESPIRATORY: no cough, SOB, DOE, wheezing, hemoptysis CARDIAC: no chest pain, edema or palpitations GI: no abdominal pain, diarrhea, constipation, heart burn, nausea or vomiting  PHYSICAL EXAMINATION  GENERAL: no acute distress, normal body habitus EYES: conjunctivae normal, sclerae normal, normal eye  lids NECK: supple, trachea midline, no neck masses, no thyroid tenderness, no thyromegaly LYMPHATICS: no LAN in the neck, no supraclavicular LAN RESPIRATORY: breathing is even & unlabored, BS CTAB CARDIAC: RRR, no murmur,no extra heart sounds, no edema GI: abdomen soft, normal BS, no masses, no tenderness, no hepatomegaly, no splenomegaly PSYCHIATRIC: the patient is alert & oriented to person, affect & behavior appropriate  LABS/RADIOLOGY: 07/2012:  Serum iron level 40, vitamin B12 level 944, folate greater than 20.   Hemoglobin 7.9, MCV 100.4, otherwise CBC normal.    Sodium 125, glucose 102, chloride 89, BUN 46, creatinine 1.75, otherwise BMP normal.    TSH 3.629.    ASSESSMENT/PLAN:  Left hip fracture.  Status post left total hip arthroplasty.  Progressed well with therapy.    Coagulopathy.  New problem.  Give vitamin K 2.5 mg one-time dose p.o.  Check INR in a.m.  Hold Coumadin.    Epistaxis.  Likely secondary to supratherapeutic INR.  No further episodes.    Allergic rhinitis.  New problem.  Start Claritin 10 mg q.d.    Hypothyroidism.  Well controlled.      Hypertension.  Continue current medications.    I have filled out patient's discharge paperwork and written prescriptions.    Total discharge time: Greater than 30 minutes Discharge time involved coordination of the discharge process with social worker, nursing staff and therapy department. Medical justification for home health services/DME verified.  CPT CODE: 14782

## 2012-09-08 ENCOUNTER — Encounter (HOSPITAL_COMMUNITY): Payer: Medicare Other

## 2012-09-14 ENCOUNTER — Encounter (HOSPITAL_COMMUNITY)
Admission: RE | Admit: 2012-09-14 | Discharge: 2012-09-14 | Disposition: A | Discharge status: Home / Self Care | Payer: Medicare Other | Source: Ambulatory Visit | Attending: Nephrology | Admitting: Nephrology

## 2012-09-14 ENCOUNTER — Inpatient Hospital Stay (HOSPITAL_COMMUNITY)
Admission: EM | Admit: 2012-09-14 | Discharge: 2012-09-25 | DRG: 166 | Disposition: A | Discharge status: Home Health | Payer: Medicare Other | Attending: Internal Medicine | Admitting: Internal Medicine

## 2012-09-14 ENCOUNTER — Encounter (HOSPITAL_COMMUNITY): Payer: Self-pay | Admitting: Emergency Medicine

## 2012-09-14 ENCOUNTER — Emergency Department (HOSPITAL_COMMUNITY): Payer: Medicare Other

## 2012-09-14 DIAGNOSIS — Z96649 Presence of unspecified artificial hip joint: Secondary | ICD-10-CM

## 2012-09-14 DIAGNOSIS — D689 Coagulation defect, unspecified: Secondary | ICD-10-CM

## 2012-09-14 DIAGNOSIS — M316 Other giant cell arteritis: Secondary | ICD-10-CM | POA: Diagnosis present

## 2012-09-14 DIAGNOSIS — E875 Hyperkalemia: Secondary | ICD-10-CM

## 2012-09-14 DIAGNOSIS — R9439 Abnormal result of other cardiovascular function study: Secondary | ICD-10-CM

## 2012-09-14 DIAGNOSIS — I10 Essential (primary) hypertension: Secondary | ICD-10-CM

## 2012-09-14 DIAGNOSIS — E039 Hypothyroidism, unspecified: Secondary | ICD-10-CM | POA: Diagnosis present

## 2012-09-14 DIAGNOSIS — I480 Paroxysmal atrial fibrillation: Secondary | ICD-10-CM

## 2012-09-14 DIAGNOSIS — Z7901 Long term (current) use of anticoagulants: Secondary | ICD-10-CM

## 2012-09-14 DIAGNOSIS — R630 Anorexia: Secondary | ICD-10-CM

## 2012-09-14 DIAGNOSIS — N259 Disorder resulting from impaired renal tubular function, unspecified: Secondary | ICD-10-CM

## 2012-09-14 DIAGNOSIS — E871 Hypo-osmolality and hyponatremia: Secondary | ICD-10-CM | POA: Diagnosis present

## 2012-09-14 DIAGNOSIS — I080 Rheumatic disorders of both mitral and aortic valves: Secondary | ICD-10-CM

## 2012-09-14 DIAGNOSIS — I4891 Unspecified atrial fibrillation: Secondary | ICD-10-CM | POA: Diagnosis present

## 2012-09-14 DIAGNOSIS — I272 Pulmonary hypertension, unspecified: Secondary | ICD-10-CM

## 2012-09-14 DIAGNOSIS — N186 End stage renal disease: Secondary | ICD-10-CM | POA: Diagnosis present

## 2012-09-14 DIAGNOSIS — R002 Palpitations: Secondary | ICD-10-CM

## 2012-09-14 DIAGNOSIS — R300 Dysuria: Secondary | ICD-10-CM

## 2012-09-14 DIAGNOSIS — R0602 Shortness of breath: Secondary | ICD-10-CM

## 2012-09-14 DIAGNOSIS — K219 Gastro-esophageal reflux disease without esophagitis: Secondary | ICD-10-CM | POA: Diagnosis present

## 2012-09-14 DIAGNOSIS — J189 Pneumonia, unspecified organism: Secondary | ICD-10-CM | POA: Diagnosis present

## 2012-09-14 DIAGNOSIS — I5032 Chronic diastolic (congestive) heart failure: Secondary | ICD-10-CM | POA: Diagnosis present

## 2012-09-14 DIAGNOSIS — K5900 Constipation, unspecified: Secondary | ICD-10-CM | POA: Diagnosis present

## 2012-09-14 DIAGNOSIS — J15212 Pneumonia due to Methicillin resistant Staphylococcus aureus: Principal | ICD-10-CM | POA: Diagnosis present

## 2012-09-14 DIAGNOSIS — I509 Heart failure, unspecified: Secondary | ICD-10-CM | POA: Diagnosis present

## 2012-09-14 DIAGNOSIS — D649 Anemia, unspecified: Secondary | ICD-10-CM | POA: Diagnosis present

## 2012-09-14 DIAGNOSIS — R040 Epistaxis: Secondary | ICD-10-CM

## 2012-09-14 DIAGNOSIS — R531 Weakness: Secondary | ICD-10-CM

## 2012-09-14 DIAGNOSIS — J309 Allergic rhinitis, unspecified: Secondary | ICD-10-CM

## 2012-09-14 DIAGNOSIS — I2699 Other pulmonary embolism without acute cor pulmonale: Secondary | ICD-10-CM

## 2012-09-14 DIAGNOSIS — N289 Disorder of kidney and ureter, unspecified: Secondary | ICD-10-CM

## 2012-09-14 DIAGNOSIS — K5909 Other constipation: Secondary | ICD-10-CM

## 2012-09-14 DIAGNOSIS — I120 Hypertensive chronic kidney disease with stage 5 chronic kidney disease or end stage renal disease: Secondary | ICD-10-CM | POA: Diagnosis present

## 2012-09-14 DIAGNOSIS — Z86711 Personal history of pulmonary embolism: Secondary | ICD-10-CM

## 2012-09-14 DIAGNOSIS — I059 Rheumatic mitral valve disease, unspecified: Secondary | ICD-10-CM | POA: Diagnosis present

## 2012-09-14 DIAGNOSIS — R911 Solitary pulmonary nodule: Secondary | ICD-10-CM

## 2012-09-14 DIAGNOSIS — I2789 Other specified pulmonary heart diseases: Secondary | ICD-10-CM

## 2012-09-14 DIAGNOSIS — H9190 Unspecified hearing loss, unspecified ear: Secondary | ICD-10-CM | POA: Diagnosis present

## 2012-09-14 HISTORY — DX: Pneumonia, unspecified organism: J18.9

## 2012-09-14 LAB — CBC WITH DIFFERENTIAL/PLATELET
Basophils Relative: 0 % (ref 0–1)
Eosinophils Absolute: 0 10*3/uL (ref 0.0–0.7)
HCT: 33.1 % — ABNORMAL LOW (ref 36.0–46.0)
Hemoglobin: 12 g/dL (ref 12.0–15.0)
Lymphocytes Relative: 4 % — ABNORMAL LOW (ref 12–46)
MCHC: 36.3 g/dL — ABNORMAL HIGH (ref 30.0–36.0)
MCV: 100 fL (ref 78.0–100.0)
Monocytes Relative: 9 % (ref 3–12)
Neutro Abs: 22.8 10*3/uL — ABNORMAL HIGH (ref 1.7–7.7)

## 2012-09-14 LAB — COMPREHENSIVE METABOLIC PANEL
Alkaline Phosphatase: 107 U/L (ref 39–117)
BUN: 64 mg/dL — ABNORMAL HIGH (ref 6–23)
Chloride: 83 mEq/L — ABNORMAL LOW (ref 96–112)
GFR calc Af Amer: 24 mL/min — ABNORMAL LOW (ref >90)
Glucose, Bld: 106 mg/dL — ABNORMAL HIGH (ref 70–99)
Potassium: 4.1 mEq/L (ref 3.5–5.1)
Total Bilirubin: 1 mg/dL (ref 0.3–1.2)

## 2012-09-14 LAB — CG4 I-STAT (LACTIC ACID): Lactic Acid, Venous: 1.27 mmol/L (ref 0.5–2.2)

## 2012-09-14 MED ORDER — VANCOMYCIN HCL IN DEXTROSE 1-5 GM/200ML-% IV SOLN
1000.0000 mg | Freq: Once | INTRAVENOUS | Status: AC
  Administered 2012-09-15: 1000 mg via INTRAVENOUS
  Filled 2012-09-14: qty 200

## 2012-09-14 MED ORDER — EPOETIN ALFA 40000 UNIT/ML IJ SOLN
30000.0000 [IU] | INTRAMUSCULAR | Status: DC
  Administered 2012-09-14: 30000 [IU] via SUBCUTANEOUS

## 2012-09-14 MED ORDER — ALBUTEROL SULFATE (5 MG/ML) 0.5% IN NEBU
5.0000 mg | INHALATION_SOLUTION | Freq: Once | RESPIRATORY_TRACT | Status: AC
  Administered 2012-09-15: 5 mg via RESPIRATORY_TRACT
  Filled 2012-09-14: qty 1

## 2012-09-14 MED ORDER — EPOETIN ALFA 10000 UNIT/ML IJ SOLN
INTRAMUSCULAR | Status: AC
  Filled 2012-09-14: qty 1

## 2012-09-14 MED ORDER — EPOETIN ALFA 20000 UNIT/ML IJ SOLN
INTRAMUSCULAR | Status: AC
  Filled 2012-09-14: qty 1

## 2012-09-14 MED ORDER — PIPERACILLIN-TAZOBACTAM 3.375 G IVPB
3.3750 g | Freq: Once | INTRAVENOUS | Status: AC
  Administered 2012-09-15: 3.375 g via INTRAVENOUS
  Filled 2012-09-14: qty 50

## 2012-09-14 MED ORDER — ACETAMINOPHEN 325 MG PO TABS
650.0000 mg | ORAL_TABLET | Freq: Once | ORAL | Status: AC
  Administered 2012-09-14: 650 mg via ORAL

## 2012-09-14 NOTE — ED Notes (Signed)
PT. REPORTS PERSISTENT PRODUCTIVE COUGH / CHEST CONGESTION FOR SEVERAL DAYS , FEBRILE AT TRIAGE .

## 2012-09-15 ENCOUNTER — Inpatient Hospital Stay (HOSPITAL_COMMUNITY): Payer: Medicare Other

## 2012-09-15 ENCOUNTER — Encounter (HOSPITAL_COMMUNITY): Payer: Self-pay | Admitting: Radiology

## 2012-09-15 DIAGNOSIS — I4891 Unspecified atrial fibrillation: Secondary | ICD-10-CM

## 2012-09-15 DIAGNOSIS — N289 Disorder of kidney and ureter, unspecified: Secondary | ICD-10-CM

## 2012-09-15 DIAGNOSIS — J189 Pneumonia, unspecified organism: Secondary | ICD-10-CM

## 2012-09-15 DIAGNOSIS — Z7901 Long term (current) use of anticoagulants: Secondary | ICD-10-CM

## 2012-09-15 DIAGNOSIS — I2789 Other specified pulmonary heart diseases: Secondary | ICD-10-CM

## 2012-09-15 DIAGNOSIS — E871 Hypo-osmolality and hyponatremia: Secondary | ICD-10-CM

## 2012-09-15 HISTORY — DX: Pneumonia, unspecified organism: J18.9

## 2012-09-15 LAB — URINALYSIS, ROUTINE W REFLEX MICROSCOPIC
Glucose, UA: NEGATIVE mg/dL
Ketones, ur: NEGATIVE mg/dL
Leukocytes, UA: NEGATIVE
pH: 5.5 (ref 5.0–8.0)

## 2012-09-15 LAB — EXPECTORATED SPUTUM ASSESSMENT W GRAM STAIN, RFLX TO RESP C

## 2012-09-15 LAB — CBC
HCT: 31.3 % — ABNORMAL LOW (ref 36.0–46.0)
Hemoglobin: 10.8 g/dL — ABNORMAL LOW (ref 12.0–15.0)
MCH: 35.1 pg — ABNORMAL HIGH (ref 26.0–34.0)
MCHC: 34.5 g/dL (ref 30.0–36.0)
MCV: 101.6 fL — ABNORMAL HIGH (ref 78.0–100.0)
RDW: 17.9 % — ABNORMAL HIGH (ref 11.5–15.5)

## 2012-09-15 LAB — TROPONIN I: Troponin I: <0.3 ng/mL (ref <0.30)

## 2012-09-15 LAB — GRAM STAIN

## 2012-09-15 LAB — URINE MICROSCOPIC-ADD ON

## 2012-09-15 LAB — BASIC METABOLIC PANEL
BUN: 66 mg/dL — ABNORMAL HIGH (ref 6–23)
Calcium: 9.2 mg/dL (ref 8.4–10.5)
Creatinine, Ser: 2.16 mg/dL — ABNORMAL HIGH (ref 0.50–1.10)
GFR calc Af Amer: 23 mL/min — ABNORMAL LOW (ref >90)
GFR calc non Af Amer: 20 mL/min — ABNORMAL LOW (ref >90)
Glucose, Bld: 116 mg/dL — ABNORMAL HIGH (ref 70–99)

## 2012-09-15 LAB — STREP PNEUMONIAE URINARY ANTIGEN: Strep Pneumo Urinary Antigen: NEGATIVE

## 2012-09-15 LAB — PROTIME-INR
INR: 2.67 — ABNORMAL HIGH (ref 0.00–1.49)
Prothrombin Time: 27.5 seconds — ABNORMAL HIGH (ref 11.6–15.2)

## 2012-09-15 MED ORDER — POLYETHYLENE GLYCOL 3350 17 G PO PACK
17.0000 g | PACK | Freq: Every day | ORAL | Status: DC
  Administered 2012-09-17 – 2012-09-25 (×6): 17 g via ORAL
  Filled 2012-09-15 (×11): qty 1

## 2012-09-15 MED ORDER — SERTRALINE HCL 25 MG PO TABS
25.0000 mg | ORAL_TABLET | Freq: Every day | ORAL | Status: DC
  Administered 2012-09-15 – 2012-09-25 (×11): 25 mg via ORAL
  Filled 2012-09-15 (×11): qty 1

## 2012-09-15 MED ORDER — PANTOPRAZOLE SODIUM 40 MG PO TBEC
40.0000 mg | DELAYED_RELEASE_TABLET | Freq: Every day | ORAL | Status: DC
  Administered 2012-09-15 – 2012-09-25 (×11): 40 mg via ORAL
  Filled 2012-09-15 (×9): qty 1

## 2012-09-15 MED ORDER — ACETAMINOPHEN 325 MG PO TABS
650.0000 mg | ORAL_TABLET | Freq: Four times a day (QID) | ORAL | Status: DC | PRN
  Administered 2012-09-15 – 2012-09-24 (×5): 650 mg via ORAL
  Filled 2012-09-15 (×4): qty 2

## 2012-09-15 MED ORDER — AMLODIPINE BESYLATE 5 MG PO TABS
5.0000 mg | ORAL_TABLET | Freq: Every day | ORAL | Status: DC
  Administered 2012-09-15 – 2012-09-16 (×2): 5 mg via ORAL
  Filled 2012-09-15 (×3): qty 1

## 2012-09-15 MED ORDER — LUBIPROSTONE 24 MCG PO CAPS
24.0000 ug | ORAL_CAPSULE | Freq: Two times a day (BID) | ORAL | Status: DC
  Administered 2012-09-15 – 2012-09-24 (×16): 24 ug via ORAL
  Filled 2012-09-15 (×24): qty 1

## 2012-09-15 MED ORDER — LEVOTHYROXINE SODIUM 75 MCG PO TABS
75.0000 ug | ORAL_TABLET | Freq: Every day | ORAL | Status: DC
  Administered 2012-09-15 – 2012-09-25 (×11): 75 ug via ORAL
  Filled 2012-09-15 (×11): qty 1

## 2012-09-15 MED ORDER — METOPROLOL TARTRATE 100 MG PO TABS
100.0000 mg | ORAL_TABLET | Freq: Two times a day (BID) | ORAL | Status: DC
  Administered 2012-09-15 – 2012-09-25 (×21): 100 mg via ORAL
  Filled 2012-09-15 (×23): qty 1

## 2012-09-15 MED ORDER — FUROSEMIDE 40 MG PO TABS
40.0000 mg | ORAL_TABLET | Freq: Every day | ORAL | Status: DC
  Administered 2012-09-15 – 2012-09-16 (×2): 40 mg via ORAL
  Filled 2012-09-15 (×3): qty 1

## 2012-09-15 MED ORDER — OXYBUTYNIN CHLORIDE ER 10 MG PO TB24
10.0000 mg | ORAL_TABLET | Freq: Every day | ORAL | Status: DC
  Administered 2012-09-15 – 2012-09-25 (×11): 10 mg via ORAL
  Filled 2012-09-15 (×11): qty 1

## 2012-09-15 MED ORDER — VANCOMYCIN HCL IN DEXTROSE 750-5 MG/150ML-% IV SOLN
750.0000 mg | INTRAVENOUS | Status: DC
  Administered 2012-09-17 – 2012-09-21 (×3): 750 mg via INTRAVENOUS
  Filled 2012-09-15 (×3): qty 150

## 2012-09-15 MED ORDER — MELATONIN 5 MG PO TABS: 5.0000 mg | ORAL_TABLET | Freq: Every day | ORAL | Status: DC

## 2012-09-15 MED ORDER — HYDROCODONE-ACETAMINOPHEN 5-325 MG PO TABS
1.0000 | ORAL_TABLET | Freq: Every morning | ORAL | Status: DC
  Administered 2012-09-15 – 2012-09-22 (×6): 1 via ORAL
  Filled 2012-09-15 (×7): qty 1

## 2012-09-15 MED ORDER — DIPHENHYDRAMINE HCL 12.5 MG/5ML PO ELIX
12.5000 mg | ORAL_SOLUTION | Freq: Once | ORAL | Status: AC
  Administered 2012-09-16: 12.5 mg via ORAL
  Filled 2012-09-15: qty 5

## 2012-09-15 MED ORDER — GUAIFENESIN 100 MG/5ML PO SYRP
200.0000 mg | ORAL_SOLUTION | Freq: Three times a day (TID) | ORAL | Status: DC | PRN
  Administered 2012-09-15 – 2012-09-24 (×6): 200 mg via ORAL
  Filled 2012-09-15 (×7): qty 10

## 2012-09-15 MED ORDER — WARFARIN - PHARMACIST DOSING INPATIENT
Freq: Every day | Status: DC
  Administered 2012-09-15: 18:00:00

## 2012-09-15 MED ORDER — ADULT MULTIVITAMIN W/MINERALS CH
1.0000 | ORAL_TABLET | Freq: Every day | ORAL | Status: DC
  Administered 2012-09-15 – 2012-09-24 (×10): 1 via ORAL
  Filled 2012-09-15 (×11): qty 1

## 2012-09-15 MED ORDER — WARFARIN SODIUM 1 MG PO TABS
1.5000 mg | ORAL_TABLET | Freq: Once | ORAL | Status: AC
  Administered 2012-09-15: 1.5 mg via ORAL
  Filled 2012-09-15: qty 1

## 2012-09-15 MED ORDER — CHOLECALCIFEROL 10 MCG (400 UNIT) PO TABS
800.0000 [IU] | ORAL_TABLET | Freq: Every day | ORAL | Status: DC
  Administered 2012-09-15 – 2012-09-25 (×11): 800 [IU] via ORAL
  Filled 2012-09-15 (×11): qty 2

## 2012-09-15 MED ORDER — WARFARIN SODIUM 3 MG PO TABS
3.0000 mg | ORAL_TABLET | Freq: Once | ORAL | Status: DC
  Filled 2012-09-15: qty 1

## 2012-09-15 MED ORDER — PIPERACILLIN-TAZOBACTAM IN DEX 2-0.25 GM/50ML IV SOLN
2.2500 g | Freq: Four times a day (QID) | INTRAVENOUS | Status: DC
  Administered 2012-09-15 – 2012-09-18 (×13): 2.25 g via INTRAVENOUS
  Filled 2012-09-15 (×15): qty 50

## 2012-09-15 MED ORDER — CALCITRIOL 0.25 MCG PO CAPS
0.2500 ug | ORAL_CAPSULE | Freq: Every day | ORAL | Status: DC
  Administered 2012-09-15 – 2012-09-25 (×11): 0.25 ug via ORAL
  Filled 2012-09-15 (×11): qty 1

## 2012-09-15 MED FILL — Epoetin Alfa Inj 10000 Unit/ML: INTRAMUSCULAR | Qty: 1 | Status: AC

## 2012-09-15 MED FILL — Epoetin Alfa Inj 20000 Unit/ML: INTRAMUSCULAR | Qty: 1 | Status: AC

## 2012-09-15 NOTE — Progress Notes (Addendum)
[facility] PROGRESS NOTE  Cheryl [name] AVW:[id number] DOB: [birth year] DOA: 09/14/2012 PCP: Abigail Miyamoto, MD  Assessment/Plan: 79 y.o. female who presents with a history of productive cough for the past several weeks. She has been on ABx as an outpatient for suspected PNA. She finished a 10 day course of this as well as steroids several days ago but continues to have cough, occasional shortness of breath, and ongoing fevers. She presents to the ED today with fever of 101.2, WBC was 26.2k, and her CXR demonstrates a RML infiltrate c/w PNA. Her Creatinine of 2.09 and sodium of 124 appear to be baseline. Hospitalist has been asked to admit for HCAP (PNA felt to be HCAP since she had an admission in June for hip fracture) failed outpatient treatment.  1. HCAP - causing fever and WBC elevation, cont zosyn and vancomycin pharm to dose, thankfully despite this having been going on for several weeks and initially worrisome lab findings with fever, the patient does not appear at all toxic nor hypoxic on exam and her biggest complaint remains the cough. Despite her relative lack of symptoms however, this will need to be treated inpatient as she has clearly failed outpatient therapy. -will obtain CT chest WO for better evaluation d/w pulmonology Dr. Vassie Loll for possible bronchoscopy  -will obtain speech therapy eval  2. [name] - continue coumadin, pharm to dose, cardiac monitor, continue home rate control meds 3. Hyponatremic - chronic and baseline; likely lung related  4. ESRD - technically appears to be CKD stage 4 with GFR of about 20, this appears to be her baseline going back for several months. She does have a dialysis graft in her L arm which was put in Oct of last year, it has a strong pulsatile thrill, this graft has not been used for dialysis yet per patient. Continue home meds, monitor BMP while here but does not appear to be an acute issue at this time.   Code Status: full  Family  Communication: daughter at the bedside  (indicate person spoken with, relationship, and if by phone, the number) Disposition Plan: likely home    Consultants:  None   Procedures:  CT   Antibiotics:  Zosyn+vanc  (indicate start date, and stop date if known)  HPI/Subjective: Patient reports cough, no fever, no chest pain   Objective: Filed Vitals:   09/15/12 1155  BP: 122/39  Pulse: 77  Temp:   Resp:     Intake/Output Summary (Last 24 hours) at 09/15/12 1156 Last data filed at 09/15/12 1100  Gross per 24 hour  Intake    480 ml  Output    701 ml  Net   -221 ml   Filed Weights   09/15/12 0100 09/15/12 0223  Weight: 62 kg (136 lb 11 oz) 53.615 kg (118 lb 3.2 oz)    Exam:   General:  Alert awake oriented; HOH  Cardiovascular: S1, S2  Respiratory: mild rhonchi   Abdomen: soft, ND, ND   Musculoskeletal: non tender     Data Reviewed: Basic Metabolic Panel:  Recent Labs Lab 09/14/12 2227 09/15/12 0540  NA 124* 126*  K 4.1 4.0  CL 83* 84*  CO2 29 30  GLUCOSE 106* 116*  BUN 64* 66*  CREATININE 2.09* 2.16*  CALCIUM 9.2 9.2   Liver Function Tests:  Recent Labs Lab 09/14/12 2227  AST 28  ALT 13  ALKPHOS 107  BILITOT 1.0  PROT 7.8  ALBUMIN 3.2*   No results found for this  basename: LIPASE, AMYLASE,  in the last 168 hours No results found for this basename: AMMONIA,  in the last 168 hours CBC:  Recent Labs Lab 09/14/12 1421 09/14/12 2227 09/15/12 0540  WBC  --  26.2* 22.4*  NEUTROABS  --  22.8*  --   HGB 11.5* 12.0 10.8*  HCT  --  33.1* 31.3*  MCV  --  100.0 101.6*  PLT  --  329 291   Cardiac Enzymes:  Recent Labs Lab 09/15/12 0027  TROPONINI <0.30   BNP (last 3 results) No results found for this basename: PROBNP,  in the last 8760 hours CBG: No results found for this basename: GLUCAP,  in the last 168 hours  Recent Results (from the past 240 hour(s))  CULTURE, EXPECTORATED SPUTUM-ASSESSMENT     Status: None   Collection  Time    09/15/12  9:11 AM      Result Value Range Status   Specimen Description SPUTUM   Final   Special Requests NONE   Final   Sputum evaluation     Final   Value: THIS SPECIMEN IS ACCEPTABLE. RESPIRATORY CULTURE REPORT TO FOLLOW.   Report Status 09/15/2012 FINAL   Final  GRAM STAIN     Status: None   Collection Time    09/15/12  9:12 AM      Result Value Range Status   Specimen Description SPUTUM   Final   Special Requests NONE   Final   Gram Stain     Final   Value: ABUNDANT WBC PRESENT,BOTH PMN AND MONONUCLEAR     MODERATE GRAM POSITIVE COCCI IN PAIRS IN CLUSTERS     RARE GRAM NEGATIVE RODS     RARE YEAST   Report Status 09/15/2012 FINAL   Final     Studies: Dg Chest 2 View  09/14/2012   *RADIOLOGY REPORT*  Clinical Data: Cough, fever.  CHEST - 2 VIEW  Comparison: 06/28/2012  Findings: Right middle lobe consolidation. Background interstitial coarsening and hyperinflation. There may be trace fluid along the right fissure.  Otherwise, no pleural effusion or pneumothorax. The Aortic tortuosity atherosclerosis without dilatation.  Upper normal heart size.  Osteopenia without acute osseous finding.  IMPRESSION: Right middle lobe pneumonia.  Recommend radiograph follow-up to resolution.  COPD.   Original Report Authenticated By: Jearld Lesch, M.D.    Scheduled Meds: . amLODipine  5 mg Oral Daily  . calcitRIOL  0.25 mcg Oral Daily  . cholecalciferol  800 Units Oral Daily  . furosemide  40 mg Oral Daily  . HYDROcodone-acetaminophen  1 tablet Oral q morning - 10a  . levothyroxine  75 mcg Oral Daily  . lubiprostone  24 mcg Oral BID WC  . metoprolol  100 mg Oral BID  . multivitamin with minerals  1 tablet Oral Daily  . oxybutynin  10 mg Oral Daily  . pantoprazole  40 mg Oral Daily  . piperacillin-tazobactam (ZOSYN)  IV  2.25 g Intravenous Q6H  . polyethylene glycol  17 g Oral Daily  . sertraline  25 mg Oral Daily  . [START ON 09/17/2012] vancomycin  750 mg Intravenous Q48H  .  Warfarin - Pharmacist Dosing Inpatient   Does not apply q1800   Continuous Infusions:   Principal Problem:   HCAP (healthcare-associated pneumonia) Active Problems:   Atrial fibrillation   Hyponatremia   ESRD (end stage renal disease)    Time spent: > 25 minutes     Esperanza Sheets  [facility] Pager [phone].  If 7PM-7AM, please contact night-coverage at [email or web address], password [facility] 09/15/2012, 11:56 AM  LOS: 1 day

## 2012-09-15 NOTE — Care Management Note (Signed)
    Page 1 of 1   09/15/2012     3:59:01 PM   CARE MANAGEMENT NOTE 09/15/2012  Patient:  Cheryl [name], Cheryl [name]   Account Number:  [id number]  Date Initiated:  09/15/2012  Documentation initiated by:  Tera Mater  Subjective/Objective Assessment:   79yo female admitted with HCAP.  Pt. lives at home with daughter.     Action/Plan:   discharge planning   Anticipated DC Date:  09/17/2012   Anticipated DC Plan:  HOME W HOME HEALTH SERVICES      DC Planning Services  CM consult      [facility] Choice  Resumption Of Svcs/PTA Provider   Choice offered to / List presented to:             [facility] agency  [facility]   Status of service:  In process, will continue to follow Medicare Important Message given?   (If response is "NO", the following Medicare IM given date fields will be blank) Date Medicare IM given:   Date Additional Medicare IM given:    Discharge Disposition:    Per UR Regulation:  Reviewed for med. necessity/level of care/duration of stay  If discussed at Long Length of Stay Meetings, dates discussed:    Comments:  09/15/12 1545 Pt. is currently being seen by [facility] for [facility] PT/RN.  Will obtain resumption of care orders. NCM will follow for further dc needs. Tera Mater, RN, BSN NCM [phone]

## 2012-09-15 NOTE — H&P (Signed)
[facility] History and Physical  Cheryl [name] ZOX:[id number] DOB: 1925/05/26 DOA: 09/14/2012  Referring physician: ED PCP: Abigail Miyamoto, MD  Chief Complaint: PNA, failed outpatient treatment  HPI: Cheryl [name] is a 79 y.o. female who presents with a history of productive cough for the past several weeks.  She has been on ABx as an outpatient for suspected PNA.  She finished a 10 day course of this as well as steroids several days ago but continues to have cough, occasional shortness of breath, and ongoing fevers.  She presents to the ED today with fever of 101.2, WBC was 26.2k, and her CXR demonstrates a RML infiltrate c/w PNA.  Her Creatinine of 2.09 and sodium of 124 appear to be baseline.  Hospitalist has been asked to admit for HCAP (PNA felt to be HCAP since she had an admission in June for hip fracture) failed outpatient treatment.  Review of Systems: 12 systems reviewed and otherwise negative.  Past Medical History  Diagnosis Date  . HTN (hypertension)     2 YEARS  . Hypothyroidism   . Temporal arteritis   . Pulmonary embolism 1997  . Mitral regurgitation     a. mild by 01/2010 echo.  . Chronic diastolic CHF (congestive heart failure)     a. 01/2010 Echo: EF 60%, mild LVH, High LV filling pressures, No RWMA, mild MR, PASP .  Arman Bogus   . Chronic anemia   . Paroxysmal atrial fibrillation   . Pulmonary HTN     a. PASP by 01/2010 echo.  . S/P cardiac cath     a. 12/2009 R Heart Cath:  RA 12, RV 45/11, PA 47/21, PCWP MEAN 26, CO 4.56, CI 2.7.  . Ischemia     a. 12/2009 Myoview: Ant ischemia, EF 75%----NO CATH DONE DUE TO CKD  . End stage renal disease     a. s/p AVF - not on dialysis.  [name] History of urinary frequency   . GERD (gastroesophageal reflux disease)   . Sleep trouble   . Constipation, chronic   . Hard of hearing   . Complication of anesthesia   . PONV (postoperative nausea and vomiting)    Past Surgical History  Procedure  Laterality Date  . Temporal arteny bx    . Cataract surgery    . Eye surgery    . Abdominal hysterectomy    . Intramedullary (im) nail intertrochanteric Left 06/30/2012    Procedure: INTRAMEDULLARY (IM) NAIL INTERTROCHANTRIC LEFT;  Surgeon: Harvie Junior, MD;  Location: [facility] OR;  Service: Orthopedics;  Laterality: Left;  . Injection knee Left 06/30/2012    Procedure: Left Knee Aspiration with Intra-Articular Injection;  Surgeon: Harvie Junior, MD;  Location: [facility] OR;  Service: Orthopedics;  Laterality: Left;   Social History:  reports that she has never smoked. She has never used smokeless tobacco. She reports that she does not drink alcohol or use illicit drugs.   Allergies  Allergen Reactions  . Codeine Other (See Comments)    unknown  . Sulfa Antibiotics     unknown    Family History  Problem Relation Age of Onset  . Heart attack Mother 57  . Stroke Father 6  . Hypertension Sister   . Other      THERE IS NO EARLY HEART DISEASE  . Cancer Brother     prostate  . Diabetes Daughter   . Hyperlipidemia Daughter   . Hypertension Daughter      Prior to  Admission medications   Medication Sig Start Date End Date Taking? Authorizing Provider  amLODipine (NORVASC) 5 MG tablet Take 5 mg by mouth daily.     Yes Historical Provider, MD  calcitRIOL (ROCALTROL) 0.25 MCG capsule Take 0.25 mcg by mouth daily.   Yes Historical Provider, MD  Cholecalciferol (VITAMIN D) 400 UNITS capsule Take 800 Units by mouth daily.    Yes Historical Provider, MD  furosemide (LASIX) 40 MG tablet Take 40 mg by mouth daily.   Yes Historical Provider, MD  guaifenesin (ROBITUSSIN) 100 MG/5ML syrup Take 200 mg by mouth 3 (three) times daily as needed for cough.   Yes Historical Provider, MD  HYDROcodone-acetaminophen (NORCO/VICODIN) 5-325 MG per tablet Take 1 tablet by mouth every morning.   Yes Historical Provider, MD  levothyroxine (SYNTHROID, LEVOTHROID) 75 MCG tablet Take 75 mcg by mouth daily.     Yes Historical  Provider, MD  lubiprostone (AMITIZA) 24 MCG capsule Take 1 capsule (24 mcg total) by mouth 2 (two) times daily with a meal. 07/02/12  Yes Jerald Kief, MD  Melatonin 5 MG TABS Take 5 mg by mouth at bedtime.    Yes Historical Provider, MD  metoprolol (LOPRESSOR) 100 MG tablet Take 1 tablet (100 mg total) by mouth 2 (two) times daily. 06/28/12  Yes Rollene Rotunda, MD  Multiple Vitamin (MULTIVITAMIN WITH MINERALS) TABS Take 1 tablet by mouth daily. Centrum Silver   Yes Historical Provider, MD  omeprazole (PRILOSEC) 20 MG capsule Take 20 mg by mouth daily.     Yes Historical Provider, MD  oxybutynin (DITROPAN-XL) 10 MG 24 hr tablet Take 10 mg by mouth daily. 10/09/11  Yes Historical Provider, MD  polyethylene glycol (MIRALAX / GLYCOLAX) packet Take 17 g by mouth daily.   Yes Historical Provider, MD  sertraline (ZOLOFT) 25 MG tablet Take 25 mg by mouth daily.   Yes Historical Provider, MD  warfarin (COUMADIN) 3 MG tablet Take 3 mg by mouth See admin instructions. Take 1 tablet on Sun, Tues, Wed, Thurs, Sat   Yes Historical Provider, MD  warfarin (COUMADIN) 4 MG tablet Take 4 mg by mouth 2 (two) times a week. Take 1 tablet on Mon and Fri   Yes Historical Provider, MD   Physical Exam: Filed Vitals:   09/15/12 0100  BP: 114/43  Pulse: 72  Temp:   Resp:     General:  NAD, resting comfortably in bed Eyes: PEERLA EOMI ENT: mucous membranes moist Neck: supple w/o JVD Cardiovascular: RRR w/o MRG Respiratory: breath sounds diminished at the R base Abdomen: soft, nt, nd, bs+ Skin: no rash nor lesion Musculoskeletal: MAE, full ROM all 4 extremities Psychiatric: normal tone and affect Neurologic: AAOx3, grossly non-focal  Labs on Admission:  Basic Metabolic Panel:  Recent Labs Lab 09/14/12 2227  NA 124*  K 4.1  CL 83*  CO2 29  GLUCOSE 106*  BUN 64*  CREATININE 2.09*  CALCIUM 9.2   Liver Function Tests:  Recent Labs Lab 09/14/12 2227  AST 28  ALT 13  ALKPHOS 107  BILITOT 1.0   PROT 7.8  ALBUMIN 3.2*   No results found for this basename: LIPASE, AMYLASE,  in the last 168 hours No results found for this basename: AMMONIA,  in the last 168 hours CBC:  Recent Labs Lab 09/14/12 1421 09/14/12 2227  WBC  --  26.2*  NEUTROABS  --  22.8*  HGB 11.5* 12.0  HCT  --  33.1*  MCV  --  100.0  PLT  --  329   Cardiac Enzymes:  Recent Labs Lab 09/15/12 0027  TROPONINI <0.30    BNP (last 3 results) No results found for this basename: PROBNP,  in the last 8760 hours CBG: No results found for this basename: GLUCAP,  in the last 168 hours  Radiological Exams on Admission: Dg Chest 2 View  09/14/2012   *RADIOLOGY REPORT*  Clinical Data: Cough, fever.  CHEST - 2 VIEW  Comparison: 06/28/2012  Findings: Right middle lobe consolidation. Background interstitial coarsening and hyperinflation. There may be trace fluid along the right fissure.  Otherwise, no pleural effusion or pneumothorax. The Aortic tortuosity atherosclerosis without dilatation.  Upper normal heart size.  Osteopenia without acute osseous finding.  IMPRESSION: Right middle lobe pneumonia.  Recommend radiograph follow-up to resolution.  COPD.   Original Report Authenticated By: Jearld Lesch, M.D.    EKG: Independently reviewed.  Assessment/Plan Principal Problem:   HCAP (healthcare-associated pneumonia) Active Problems:   Atrial fibrillation   Hyponatremia   ESRD (end stage renal disease)   1. HCAP - causing fever and WBC elevation, treating with zosyn and vancomycin pharm to dose, thankfully despite this having been going on for several weeks and initially worrisome lab findings with fever, the patient does not appear at all toxic nor hypoxic on exam and her biggest complaint remains the cough.  Despite her relative lack of symptoms however, this will need to be treated inpatient as she has clearly failed outpatient therapy. 2. [name] - continue coumadin, pharm to dose, cardiac monitor, continue  home rate control meds 3. Hyponatremic - chronic and baseline 4. ESRD - technically appears to be CKD stage 4 with GFR of about 20, this appears to be her baseline going back for several months.  She does have a dialysis graft in her L arm which was put in Oct of last year, it has a strong pulsatile thrill, this graft has not been used for dialysis yet per patient.  Continue home meds, monitor BMP while here but does not appear to be an acute issue at this time.    Code Status: Full Code (must indicate code status--if unknown or must be presumed, indicate so) Family Communication: Spoke with daughter at bedside (indicate person spoken with, if applicable, with phone number if by telephone) Disposition Plan: Admit to inpatient (indicate anticipated LOS)  Time spent: 70 min  Burle Kwan M. [facility] Pager [phone]  If 7PM-7AM, please contact night-coverage [email or web address] Password [email or web address] 09/15/2012, 1:50 AM

## 2012-09-15 NOTE — Progress Notes (Signed)
Utilization Review Completed.   Sarah-Jane Nazario, RN, BSN Nurse Case Manager  [phone]  

## 2012-09-15 NOTE — Progress Notes (Signed)
ANTIBIOTIC CONSULT NOTE - INITIAL  Pharmacy Consult for Vancomycin and Zosyn and Coumadin Indication:  PNA and h/o Afib   Allergies  Allergen Reactions  . Codeine Other (See Comments)    unknown  . Sulfa Antibiotics     unknown    Patient Measurements: Height: 5' 1.81" (157 cm) Weight: 136 lb 11 oz (62 kg) IBW/kg (Calculated) : 49.67  Vital Signs: Temp: 100.7 F (38.2 C) (08/26 2343) Temp src: Oral (08/26 2343) BP: 114/43 mmHg (08/27 0100) Pulse Rate: 72 (08/27 0100)   Labs:  Recent Labs  09/14/12 1421 09/14/12 2227  WBC  --  26.2*  HGB 11.5* 12.0  PLT  --  329  CREATININE  --  2.09*   Estimated Creatinine Clearance: 17 ml/min (by C-G formula based on Cr of 2.09).  Recent Labs     09/15/12  0022  INR  2.67*    No results found for this basename: VANCOTROUGH, VANCOPEAK, VANCORANDOM, GENTTROUGH, GENTPEAK, GENTRANDOM, TOBRATROUGH, TOBRAPEAK, TOBRARND, AMIKACINPEAK, AMIKACINTROU, AMIKACIN,  in the last 72 hours   Microbiology: No results found for this or any previous visit (from the past 720 hour(s)).  Medical History: Past Medical History  Diagnosis Date  . HTN (hypertension)     2 YEARS  . Hypothyroidism   . Temporal arteritis   . Pulmonary embolism 1997  . Mitral regurgitation     a. mild by 01/2010 echo.  . Chronic diastolic CHF (congestive heart failure)     a. 01/2010 Echo: EF 60%, mild LVH, High LV filling pressures, No RWMA, mild MR, PASP .  Arman Bogus   . Chronic anemia   . Paroxysmal atrial fibrillation   . Pulmonary HTN     a. PASP by 01/2010 echo.  . S/P cardiac cath     a. 12/2009 R Heart Cath:  RA 12, RV 45/11, PA 47/21, PCWP MEAN 26, CO 4.56, CI 2.7.  . Ischemia     a. 12/2009 Myoview: Ant ischemia, EF 75%----NO CATH DONE DUE TO CKD  . End stage renal disease     a. s/p AVF - not on dialysis.  [name] History of urinary frequency   . GERD (gastroesophageal reflux disease)   . Sleep trouble   . Constipation, chronic   . Hard  of hearing   . Complication of anesthesia   . PONV (postoperative nausea and vomiting)     Medications:  Norvasc  Calcitriol  Vit D  Lasix  Robitussin  Norco  Synthroid  Amitiza  Melatonin  Lopressor  MVI  Prilosec  Ditropan XL  Miralax  Zoloft   Coumadin 3 mg TWRSS  mg MF  Assessment: 79 yo female with cough/fever/PNA for empiric antibiotics.  Vancomycin 1 g IV given in ED at 0145 Coumadin to continue for h/o Afib  Goal of Therapy:  Vancomycin trough level 15-20 mcg/ml INR 2-3  Plan:  Vancomycin 750 mg IV q48h Zosyn 2.25 g IV q6h Daily INR  Surya Schroeter, Gary Fleet 09/15/2012,1:44 AM

## 2012-09-15 NOTE — Progress Notes (Signed)
ANTICOAGULATION CONSULT NOTE - Initial Consult  Pharmacy Consult for coumadin---> heparin when inr <2 Indication: atrial fibrillation  Allergies  Allergen Reactions  . Codeine Other (See Comments)    unknown  . Sulfa Antibiotics     unknown    Patient Measurements: Height: 5\' 1"  (154.9 cm) Weight: 118 lb 3.2 oz (53.615 kg) (scale c) IBW/kg (Calculated) : 47.8 Heparin Dosing Weight:   Vital Signs: Temp: 98 F (36.7 C) (08/27 2050) Temp src: Oral (08/27 2050) BP: 108/50 mmHg (08/27 2050) Pulse Rate: 64 (08/27 2050)  Labs:  Recent Labs  09/14/12 2227 09/15/12 0022 09/15/12 0027 09/15/12 0540  HGB 12.0  --   --  10.8*  HCT 33.1*  --   --  31.3*  PLT 329  --   --  291  LABPROT  --  27.5*  --  29.8*  INR  --  2.67*  --  2.96*  CREATININE 2.09*  --   --  2.16*  TROPONINI  --   --  <0.30  --     Estimated Creatinine Clearance: 14.4 ml/min (by C-G formula based on Cr of 2.16).   Medical History: Past Medical History  Diagnosis Date  . HTN (hypertension)     2 YEARS  . Hypothyroidism   . Temporal arteritis   . Pulmonary embolism 1997  . Mitral regurgitation     a. mild by 01/2010 echo.  . Chronic diastolic CHF (congestive heart failure)     a. 01/2010 Echo: EF 60%, mild LVH, High LV filling pressures, No RWMA, mild MR, PASP .  Arman Bogus   . Chronic anemia   . Paroxysmal atrial fibrillation   . Pulmonary HTN     a. PASP by 01/2010 echo.  . S/P cardiac cath     a. 12/2009 R Heart Cath:  RA 12, RV 45/11, PA 47/21, PCWP MEAN 26, CO 4.56, CI 2.7.  . Ischemia     a. 12/2009 Myoview: Ant ischemia, EF 75%----NO CATH DONE DUE TO CKD  . End stage renal disease     a. s/p AVF - not on dialysis.  [name] History of urinary frequency   . GERD (gastroesophageal reflux disease)   . Sleep trouble   . Constipation, chronic   . Hard of hearing   . Complication of anesthesia   . PONV (postoperative nausea and vomiting)   . Pneumonia 09/15/2012    Medications:   Prescriptions prior to admission  Medication Sig Dispense Refill  . amLODipine (NORVASC) 5 MG tablet Take 5 mg by mouth daily.        . calcitRIOL (ROCALTROL) 0.25 MCG capsule Take 0.25 mcg by mouth daily.      . Cholecalciferol (VITAMIN D) 400 UNITS capsule Take 800 Units by mouth daily.       . furosemide (LASIX) 40 MG tablet Take 40 mg by mouth daily.      [name] guaifenesin (ROBITUSSIN) 100 MG/5ML syrup Take 200 mg by mouth 3 (three) times daily as needed for cough.      [name] HYDROcodone-acetaminophen (NORCO/VICODIN) 5-325 MG per tablet Take 1 tablet by mouth every morning.      [name] levothyroxine (SYNTHROID, LEVOTHROID) 75 MCG tablet Take 75 mcg by mouth daily.        [name] lubiprostone (AMITIZA) 24 MCG capsule Take 1 capsule (24 mcg total) by mouth 2 (two) times daily with a meal.  60 capsule  0  . Melatonin 5 MG TABS Take 5 mg by  mouth at bedtime.       . metoprolol (LOPRESSOR) 100 MG tablet Take 1 tablet (100 mg total) by mouth 2 (two) times daily.  60 tablet  11  . Multiple Vitamin (MULTIVITAMIN WITH MINERALS) TABS Take 1 tablet by mouth daily. Centrum Silver      . omeprazole (PRILOSEC) 20 MG capsule Take 20 mg by mouth daily.        [name] oxybutynin (DITROPAN-XL) 10 MG 24 hr tablet Take 10 mg by mouth daily.      . polyethylene glycol (MIRALAX / GLYCOLAX) packet Take 17 g by mouth daily.      . sertraline (ZOLOFT) 25 MG tablet Take 25 mg by mouth daily.      [name] warfarin (COUMADIN) 3 MG tablet Take 3 mg by mouth See admin instructions. Take 1 tablet on Sun, Tues, Wed, Thurs, Sat      . warfarin (COUMADIN) 4 MG tablet Take 4 mg by mouth 2 (two) times a week. Take 1 tablet on Mon and Fri        Assessment: Non-resolving pna requiring bronch. INR currently 2.96 with last dose of  Coumadin this evening 8/27. Coumadin dc'ed Goal of Therapy:  hepairn 0.3-0.7  Monitor platelets by anticoagulation protocol: Yes   Plan:  Monitor INR daily and begin heparin when <2 ( no vitamin k given at this time) will  f/u daily INR  Janice Coffin 09/15/2012,11:43 PM

## 2012-09-15 NOTE — Consult Note (Signed)
PULMONARY  / CRITICAL CARE MEDICINE  Name: Cheryl [name] MRN: [id number] DOB: [birth year]    ADMISSION DATE:  09/14/2012 CONSULTATION DATE:  09/15/2012  REFERRING MD :  York Spaniel [facility] PRIMARY SERVICE: [facility]  CHIEF COMPLAINT:  Non-resolving pneumonia  BRIEF PATIENT DESCRIPTION: 79 y/o female was admitted on 8/26 to the [facility] service at [facility] for pneumonia unresponsive to antibiotics as an outpatient; a CT chest was concerning for an endobronchial lesion causing post obstructive pneumonia.  SIGNIFICANT EVENTS / STUDIES:  8/27 CT chest >> complete occlusion of the RML bronchus with dense RML and RLL consolidation and interlobular septal thickening; enlarge paratracheal lymph nodes  LINES / TUBES:   CULTURES: 8/27 sputum >> mod gm pos cocci; rare GNR, rare yeast 8/27 blood >>  ANTIBIOTICS: 8/26 vanc >> 8/26 zosyn >>  HISTORY OF PRESENT ILLNESS:   79 y/o female was admitted on 8/26 to the [facility] service at [facility] for pneumonia unresponsive to antibiotics as an outpatient; a CT chest was concerning for an endobronchial lesion causing post obstructive pneumonia. She cannot recall the details of her illness on my visit.  She stated that she had been experiencing a strong cough for several days but did not recall whether or not she has been treated with antibiotics or the duration of the illness.  Chart review notes that she has been treated with antibiotics as an outpatient and was admitted for persistent fevers.  She underwent a hip replacement in June.  PAST MEDICAL HISTORY :  Past Medical History  Diagnosis Date  . HTN (hypertension)     2 YEARS  . Hypothyroidism   . Temporal arteritis   . Pulmonary embolism 1997  . Mitral regurgitation     a. mild by 01/2010 echo.  . Chronic diastolic CHF (congestive heart failure)     a. 01/2010 Echo: EF 60%, mild LVH, High LV filling pressures, No RWMA, mild MR, PASP .  Arman Bogus   . Chronic anemia   . Paroxysmal atrial fibrillation   . Pulmonary  HTN     a. PASP by 01/2010 echo.  . S/P cardiac cath     a. 12/2009 R Heart Cath:  RA 12, RV 45/11, PA 47/21, PCWP MEAN 26, CO 4.56, CI 2.7.  . Ischemia     a. 12/2009 Myoview: Ant ischemia, EF 75%----NO CATH DONE DUE TO CKD  . End stage renal disease     a. s/p AVF - not on dialysis.  [name] History of urinary frequency   . GERD (gastroesophageal reflux disease)   . Sleep trouble   . Constipation, chronic   . Hard of hearing   . Complication of anesthesia   . PONV (postoperative nausea and vomiting)   . Pneumonia 09/15/2012   Past Surgical History  Procedure Laterality Date  . Temporal arteny bx    . Cataract surgery    . Eye surgery    . Abdominal hysterectomy    . Intramedullary (im) nail intertrochanteric Left 06/30/2012    Procedure: INTRAMEDULLARY (IM) NAIL INTERTROCHANTRIC LEFT;  Surgeon: Harvie Junior, MD;  Location: [facility] OR;  Service: Orthopedics;  Laterality: Left;  . Injection knee Left 06/30/2012    Procedure: Left Knee Aspiration with Intra-Articular Injection;  Surgeon: Harvie Junior, MD;  Location: [facility] OR;  Service: Orthopedics;  Laterality: Left;   Prior to Admission medications   Medication Sig Start Date End Date Taking? Authorizing Provider  amLODipine (NORVASC) 5 MG tablet Take 5 mg by mouth daily.  Yes Historical Provider, MD  calcitRIOL (ROCALTROL) 0.25 MCG capsule Take 0.25 mcg by mouth daily.   Yes Historical Provider, MD  Cholecalciferol (VITAMIN D) 400 UNITS capsule Take 800 Units by mouth daily.    Yes Historical Provider, MD  furosemide (LASIX) 40 MG tablet Take 40 mg by mouth daily.   Yes Historical Provider, MD  guaifenesin (ROBITUSSIN) 100 MG/5ML syrup Take 200 mg by mouth 3 (three) times daily as needed for cough.   Yes Historical Provider, MD  HYDROcodone-acetaminophen (NORCO/VICODIN) 5-325 MG per tablet Take 1 tablet by mouth every morning.   Yes Historical Provider, MD  levothyroxine (SYNTHROID, LEVOTHROID) 75 MCG tablet Take 75 mcg by mouth  daily.     Yes Historical Provider, MD  lubiprostone (AMITIZA) 24 MCG capsule Take 1 capsule (24 mcg total) by mouth 2 (two) times daily with a meal. 07/02/12  Yes Jerald Kief, MD  Melatonin 5 MG TABS Take 5 mg by mouth at bedtime.    Yes Historical Provider, MD  metoprolol (LOPRESSOR) 100 MG tablet Take 1 tablet (100 mg total) by mouth 2 (two) times daily. 06/28/12  Yes Rollene Rotunda, MD  Multiple Vitamin (MULTIVITAMIN WITH MINERALS) TABS Take 1 tablet by mouth daily. Centrum Silver   Yes Historical Provider, MD  omeprazole (PRILOSEC) 20 MG capsule Take 20 mg by mouth daily.     Yes Historical Provider, MD  oxybutynin (DITROPAN-XL) 10 MG 24 hr tablet Take 10 mg by mouth daily. 10/09/11  Yes Historical Provider, MD  polyethylene glycol (MIRALAX / GLYCOLAX) packet Take 17 g by mouth daily.   Yes Historical Provider, MD  sertraline (ZOLOFT) 25 MG tablet Take 25 mg by mouth daily.   Yes Historical Provider, MD  warfarin (COUMADIN) 3 MG tablet Take 3 mg by mouth See admin instructions. Take 1 tablet on Sun, Tues, Wed, Thurs, Sat   Yes Historical Provider, MD  warfarin (COUMADIN) 4 MG tablet Take 4 mg by mouth 2 (two) times a week. Take 1 tablet on Mon and Fri   Yes Historical Provider, MD   Allergies  Allergen Reactions  . Codeine Other (See Comments)    unknown  . Sulfa Antibiotics     unknown    FAMILY HISTORY:  Family History  Problem Relation Age of Onset  . Heart attack Mother 43  . Stroke Father 37  . Hypertension Sister   . Other      THERE IS NO EARLY HEART DISEASE  . Cancer Brother     prostate  . Diabetes Daughter   . Hyperlipidemia Daughter   . Hypertension Daughter    SOCIAL HISTORY:  reports that she has never smoked. She has never used smokeless tobacco. She reports that she does not drink alcohol or use illicit drugs.  REVIEW OF SYSTEMS:  Gen: Denies fever, chills, weight change, fatigue, night sweats HEENT: Denies blurred vision, double vision, hearing loss,  tinnitus, sinus congestion, rhinorrhea, sore throat, neck stiffness, dysphagia PULM: per HPI CV: Denies chest pain, edema, orthopnea, paroxysmal nocturnal dyspnea, palpitations GI: Denies abdominal pain, nausea, vomiting, diarrhea, hematochezia, melena, constipation, change in bowel habits GU: Denies dysuria, hematuria, polyuria, oliguria, urethral discharge Endocrine: Denies hot or cold intolerance, polyuria, polyphagia or appetite change Derm: Denies rash, dry skin, scaling or peeling skin change Heme: Denies easy bruising, bleeding, bleeding gums Neuro: Denies headache, numbness, weakness, slurred speech, admits forgetfulness   SUBJECTIVE:   VITAL SIGNS: Temp:  [98 F (36.7 C)-100.7 F (38.2 C)] 98 F (36.7 C) (  08/27 2050) Pulse Rate:  [42-80] 64 (08/27 2050) Resp:  [18-20] 18 (08/27 2050) BP: (105-126)/(38-67) 108/50 mmHg (08/27 2050) SpO2:  [91 %-100 %] 95 % (08/27 2050) Weight:  [53.615 kg (118 lb 3.2 oz)-62 kg (136 lb 11 oz)] 53.615 kg (118 lb 3.2 oz) (08/27 0223) HEMODYNAMICS:   VENTILATOR SETTINGS:   INTAKE / OUTPUT: Intake/Output     08/27 0701 - 08/28 0700   P.O. 720   Total Intake(mL/kg) 720 (13.4)   Urine (mL/kg/hr) 1400 (1.6)   Stool 2 (0)   Total Output 1402   Net -682         PHYSICAL EXAMINATION: Gen: elderly female, no acute distress HEENT: NCAT, PERRL, EOMi, OP clear, neck supple without masses PULM: poor air movement RLL, crackles RLL, clear on left CV: Irreg irreg, no mgr, no JVD AB: BS+, soft, nontender, no hsm Ext: warm, no edema, no clubbing, no cyanosis Derm: no rash or skin breakdown Neuro: Awake and alert, oriented to place, situation, but cannot give detailed answers, follows commands, maew   LABS:  CBC Recent Labs     09/14/12  1421  09/14/12  2227  09/15/12  0540  WBC   --   26.2*  22.4*  HGB  11.5*  12.0  10.8*  HCT   --   33.1*  31.3*  PLT   --   329  291   Coag's Recent Labs     09/15/12  0022  09/15/12  0540  INR   2.67*  2.96*   BMET Recent Labs     09/14/12  2227  09/15/12  0540  NA  124*  126*  K  4.1  4.0  CL  83*  84*  CO2  29  30  BUN  64*  66*  CREATININE  2.09*  2.16*  GLUCOSE  106*  116*   Electrolytes Recent Labs     09/14/12  2227  09/15/12  0540  CALCIUM  9.2  9.2   Sepsis Markers No results found for this basename: LACTICACIDVEN, PROCALCITON, O2SATVEN,  in the last 72 hours ABG No results found for this basename: PHART, PCO2ART, PO2ART,  in the last 72 hours Liver Enzymes Recent Labs     09/14/12  2227  AST  28  ALT  13  ALKPHOS  107  BILITOT  1.0  ALBUMIN  3.2*   Cardiac Enzymes Recent Labs     09/15/12  0027  TROPONINI  <0.30   Glucose No results found for this basename: GLUCAP,  in the last 72 hours   CXR: RLL, RML airspace disease  ASSESSMENT / PLAN:  PULMONARY A: HCAP unresponsive to outpatient therapy; CT chest worrisome for RML obstruction, ddx includes mucus plugging vs malignancy  Mild to moderate pulmonary hypertension by [facility] in 2011; WHO class 2 from diastolic CHF   P:   -agree with current antibiotics, see ID -needs bronchoscopy, but would not perform transbronchial biopsies given PH -cannot perform biopsies until INR normalizes -will place heparin consult to be started when INR < 2.0 -no indication for pulmonary vasodilators in WHO class 2 pulmonary hypertension  CARDIOVASCULAR A: diastolic CHF, currently well compensated Afib on warfarin P:  -continue metop, lasix, amlodipine per TRH  RENAL A:  CKD at baseline Hyponatremia P:   -per [facility]   INFECTIOUS A:  HCAP P:   -agree with vanc/zosyn for now -f/u cultures  NEUROLOGIC A:  Slight forgetfullness/confusion on 8/27 during my visit; early dementia? Fatigue?  Drug effect P:   -monitor neuro status -minimize sedating meds  TODAY'S SUMMARYFonnie Jarvis Pulmonary and Critical Care Medicine [facility] Pager: [phone]  09/15/2012, 11:11  PM

## 2012-09-15 NOTE — Progress Notes (Signed)

## 2012-09-15 NOTE — Progress Notes (Signed)
[facility]  Patient Status: Active (receiving services up to time of hospitalization)  [facility] is providing the following services: RN and PT  If patient discharges after hours, please call [phone].   Cheryl [name] 09/15/2012, 3:08 PM

## 2012-09-15 NOTE — Progress Notes (Signed)
The patient arrived to room 4E19 from the ED.  She was oriented to the unit and placed on telemetry.  VS were taken and the patient was assessed.  She is A&Ox3 and does not have any complaints of pain at this time.  She is coughing up yellow-tinged sputum.  The call bell was placed within reach and the bed alarm was turned on.  The daughter is at the bedside.

## 2012-09-15 NOTE — Progress Notes (Signed)
ANTICOAGULATION CONSULT NOTE - Follow Up Consult  Pharmacy Consult for Coumadin Indication: atrial fibrillation  Allergies  Allergen Reactions  . Codeine Other (See Comments)    unknown  . Sulfa Antibiotics     unknown    Patient Measurements: Height: 5\' 1"  (154.9 cm) Weight: 118 lb 3.2 oz (53.615 kg) (scale c) IBW/kg (Calculated) : 47.8  Vital Signs: Temp: 98.9 F (37.2 C) (08/27 1300) Temp src: Oral (08/27 1300) BP: 105/38 mmHg (08/27 1300) Pulse Rate: 70 (08/27 1300)  Labs:  Recent Labs  09/14/12 2227 09/15/12 0022 09/15/12 0027 09/15/12 0540  HGB 12.0  --   --  10.8*  HCT 33.1*  --   --  31.3*  PLT 329  --   --  291  LABPROT  --  27.5*  --  29.8*  INR  --  2.67*  --  2.96*  CREATININE 2.09*  --   --  2.16*  TROPONINI  --   --  <0.30  --     Estimated Creatinine Clearance: 14.4 ml/min (by C-G formula based on Cr of 2.16).   Assessment: 79 y/o female admitted for HCAP. Patient is on chronic Coumadin for Afib. INR is therapeutic on upper end of normal at 2.96 today. Last dose at home was 8/26 and regimen is 3 mg daily except 4 mg Mon/Fri. Will give a little less Coumadin tonight. No bleeding noted, H/H lower than admit, platelets are stable.  Goal of Therapy:  INR 2-3 Monitor platelets by anticoagulation protocol: Yes   Plan:  - Coumadin 1.5 mg PO tonight - INR daily - Monitor for signs/symptoms of bleeding  [facility], [address].D., BCPS Clinical Pharmacist Pager: [phone] 09/15/2012 2:09 PM

## 2012-09-15 NOTE — ED Provider Notes (Signed)
CSN: [id number]     Arrival date & time 09/14/12  2214 History   First MD Initiated Contact with Patient 09/14/12 2347     Chief Complaint  Patient presents with  . Cough  . Fever   (Consider location/radiation/quality/duration/timing/severity/associated sxs/prior Treatment) HPI Patient presents emergency department with ongoing productive cough for the past several weeks.  She's been on antibiotics for suspected pneumonia.  She states she finished these several days ago.  She continues to have cough and occasional shortness of breath.  She presents the emergency department for fever around 1.2.  She denies abdominal pain.  No urinary complaints.  No nausea vomiting or diarrhea.  No rash.  She's mild headache at this time.  She denies neck pain or neck stiffness.  The reason that the family finally brought in today was because she had significant worsening of her cough this evening and it sounds that she developed some significant paroxysmal cough.  She states now this is improved somewhat.   Past Medical History  Diagnosis Date  . HTN (hypertension)     2 YEARS  . Hypothyroidism   . Temporal arteritis   . Pulmonary embolism 1997  . Mitral regurgitation     a. mild by 01/2010 echo.  . Chronic diastolic CHF (congestive heart failure)     a. 01/2010 Echo: EF 60%, mild LVH, High LV filling pressures, No RWMA, mild MR, PASP .  Arman Bogus   . Chronic anemia   . Paroxysmal atrial fibrillation   . Pulmonary HTN     a. PASP by 01/2010 echo.  . S/P cardiac cath     a. 12/2009 R Heart Cath:  RA 12, RV 45/11, PA 47/21, PCWP MEAN 26, CO 4.56, CI 2.7.  . Ischemia     a. 12/2009 Myoview: Ant ischemia, EF 75%----NO CATH DONE DUE TO CKD  . End stage renal disease     a. s/p AVF - not on dialysis.  [name] History of urinary frequency   . GERD (gastroesophageal reflux disease)   . Sleep trouble   . Constipation, chronic   . Hard of hearing   . Complication of anesthesia   . PONV  (postoperative nausea and vomiting)    Past Surgical History  Procedure Laterality Date  . Temporal arteny bx    . Cataract surgery    . Eye surgery    . Abdominal hysterectomy    . Intramedullary (im) nail intertrochanteric Left 06/30/2012    Procedure: INTRAMEDULLARY (IM) NAIL INTERTROCHANTRIC LEFT;  Surgeon: Harvie Junior, MD;  Location: [facility] OR;  Service: Orthopedics;  Laterality: Left;  . Injection knee Left 06/30/2012    Procedure: Left Knee Aspiration with Intra-Articular Injection;  Surgeon: Harvie Junior, MD;  Location: [facility] OR;  Service: Orthopedics;  Laterality: Left;   Family History  Problem Relation Age of Onset  . Heart attack Mother 24  . Stroke Father 10  . Hypertension Sister   . Other      THERE IS NO EARLY HEART DISEASE  . Cancer Brother     prostate  . Diabetes Daughter   . Hyperlipidemia Daughter   . Hypertension Daughter    History  Substance Use Topics  . Smoking status: Never Smoker   . Smokeless tobacco: Never Used  . Alcohol Use: No   OB History   Grav Para Term Preterm Abortions TAB SAB Ect Mult Living  Review of Systems  All other systems reviewed and are negative.    Allergies  Codeine and Sulfa antibiotics  Home Medications   Current Outpatient Rx  Name  Route  Sig  Dispense  Refill  . amLODipine (NORVASC) 5 MG tablet   Oral   Take 5 mg by mouth daily.           . calcitRIOL (ROCALTROL) 0.25 MCG capsule   Oral   Take 0.25 mcg by mouth daily.         . Cholecalciferol (VITAMIN D) 400 UNITS capsule   Oral   Take 800 Units by mouth daily.          . furosemide (LASIX) 40 MG tablet   Oral   Take 40 mg by mouth daily.         [name] guaifenesin (ROBITUSSIN) 100 MG/5ML syrup   Oral   Take 200 mg by mouth 3 (three) times daily as needed for cough.         [name] HYDROcodone-acetaminophen (NORCO/VICODIN) 5-325 MG per tablet   Oral   Take 1 tablet by mouth every morning.         [name] levothyroxine (SYNTHROID,  LEVOTHROID) 75 MCG tablet   Oral   Take 75 mcg by mouth daily.           [name] lubiprostone (AMITIZA) 24 MCG capsule   Oral   Take 1 capsule (24 mcg total) by mouth 2 (two) times daily with a meal.   60 capsule   0   . Melatonin 5 MG TABS   Oral   Take 5 mg by mouth at bedtime.          . metoprolol (LOPRESSOR) 100 MG tablet   Oral   Take 1 tablet (100 mg total) by mouth 2 (two) times daily.   60 tablet   11   . Multiple Vitamin (MULTIVITAMIN WITH MINERALS) TABS   Oral   Take 1 tablet by mouth daily. Centrum Silver         . omeprazole (PRILOSEC) 20 MG capsule   Oral   Take 20 mg by mouth daily.           [name] oxybutynin (DITROPAN-XL) 10 MG 24 hr tablet   Oral   Take 10 mg by mouth daily.         . polyethylene glycol (MIRALAX / GLYCOLAX) packet   Oral   Take 17 g by mouth daily.         . sertraline (ZOLOFT) 25 MG tablet   Oral   Take 25 mg by mouth daily.         [name] warfarin (COUMADIN) 3 MG tablet   Oral   Take 3 mg by mouth See admin instructions. Take 1 tablet on Sun, Tues, Wed, Thurs, Sat         . warfarin (COUMADIN) 4 MG tablet   Oral   Take 4 mg by mouth 2 (two) times a week. Take 1 tablet on Mon and Fri          BP 126/67  Pulse 42  Temp(Src) 100.7 F (38.2 C) (Oral)  Resp 16  SpO2 100% Physical Exam  Nursing note and vitals reviewed. Constitutional: She is oriented to person, place, and time. She appears well-developed and well-nourished. No distress.  HENT:  Head: Normocephalic and atraumatic.  Eyes: EOM are normal.  Neck: Normal range of motion.  Cardiovascular: Normal rate, regular rhythm and normal heart sounds.  Pulmonary/Chest: Effort normal and breath sounds normal.  Rhonchi bilaterally  Abdominal: Soft. She exhibits no distension. There is no tenderness.  Musculoskeletal: Normal range of motion.  Neurological: She is alert and oriented to person, place, and time.  Skin: Skin is warm and dry.  Psychiatric: She has a  normal mood and affect. Judgment normal.    ED Course  Procedures (including critical care time) Labs Review Labs Reviewed  CBC WITH DIFFERENTIAL - Abnormal; Notable for the following:    WBC 26.2 (*)    RBC 3.31 (*)    HCT 33.1 (*)    MCH 36.3 (*)    MCHC 36.3 (*)    RDW 17.5 (*)    Neutrophils Relative % 87 (*)    Lymphocytes Relative 4 (*)    Neutro Abs 22.8 (*)    Monocytes Absolute 2.4 (*)    All other components within normal limits  COMPREHENSIVE METABOLIC PANEL - Abnormal; Notable for the following:    Sodium 124 (*)    Chloride 83 (*)    Glucose, Bld 106 (*)    BUN 64 (*)    Creatinine, Ser 2.09 (*)    Albumin 3.2 (*)    GFR calc non Af Amer 20 (*)    GFR calc Af Amer 24 (*)    All other components within normal limits  CULTURE, BLOOD (ROUTINE X 2)  CULTURE, BLOOD (ROUTINE X 2)  URINALYSIS, ROUTINE W REFLEX MICROSCOPIC  TROPONIN I  PROTIME-INR  CG4 I-STAT (LACTIC ACID)   Imaging Review Dg Chest 2 View  09/14/2012   *RADIOLOGY REPORT*  Clinical Data: Cough, fever.  CHEST - 2 VIEW  Comparison: 06/28/2012  Findings: Right middle lobe consolidation. Background interstitial coarsening and hyperinflation. There may be trace fluid along the right fissure.  Otherwise, no pleural effusion or pneumothorax. The Aortic tortuosity atherosclerosis without dilatation.  Upper normal heart size.  Osteopenia without acute osseous finding.  IMPRESSION: Right middle lobe pneumonia.  Recommend radiograph follow-up to resolution.  COPD.   Original Report Authenticated By: Jearld Lesch, M.D.  I personally reviewed the imaging tests through PACS system I reviewed available ER/hospitalization records through the EMR   MDM   1. HCAP (healthcare-associated pneumonia)    Patient with evidence of right middle lobe pneumonia, fever, elevated white blood cell count.  Given her age she will need to be admitted the hospital for healthcare associated pneumonia given her recent treatment  with oral antibiotics.  Albuterol for cough.  Blood cultures obtained.  Urinalysis pending.    Lyanne Co, MD 09/15/12 [phone]

## 2012-09-16 LAB — CBC
Hemoglobin: 11.1 g/dL — ABNORMAL LOW (ref 12.0–15.0)
MCHC: 36 g/dL (ref 30.0–36.0)
Platelets: 284 10*3/uL (ref 150–400)
RBC: 3.08 MIL/uL — ABNORMAL LOW (ref 3.87–5.11)

## 2012-09-16 LAB — PROTIME-INR: Prothrombin Time: 32.6 seconds — ABNORMAL HIGH (ref 11.6–15.2)

## 2012-09-16 LAB — LEGIONELLA ANTIGEN, URINE: Legionella Antigen, Urine: NEGATIVE

## 2012-09-16 LAB — BASIC METABOLIC PANEL
BUN: 61 mg/dL — ABNORMAL HIGH (ref 6–23)
GFR calc Af Amer: 21 mL/min — ABNORMAL LOW (ref >90)
GFR calc non Af Amer: 18 mL/min — ABNORMAL LOW (ref >90)
Potassium: 4.5 mEq/L (ref 3.5–5.1)
Sodium: 126 mEq/L — ABNORMAL LOW (ref 135–145)

## 2012-09-16 MED ORDER — ENSURE COMPLETE PO LIQD
237.0000 mL | Freq: Two times a day (BID) | ORAL | Status: DC
  Administered 2012-09-16 – 2012-09-20 (×8): 237 mL via ORAL

## 2012-09-16 MED ORDER — LORAZEPAM 0.5 MG PO TABS
0.5000 mg | ORAL_TABLET | Freq: Once | ORAL | Status: AC
  Administered 2012-09-17: 0.5 mg via ORAL
  Filled 2012-09-16 (×2): qty 1

## 2012-09-16 NOTE — Progress Notes (Signed)
SLP Cancellation Note  Patient Details Name: Cheryl [name] MRN: [id number] DOB: [birth year]   Cancelled treatment:        Unable to complete swallow evaluation at this time, due to pt NPO for procedure.  Will continue efforts.   Cheryl [name] [facility], CCC-SLP [phone]   Leigh Aurora 09/16/2012, 9:19 AM

## 2012-09-16 NOTE — Progress Notes (Signed)
Pt's daughter, Nena Alexander, has been informed of the plan for a bronchoscopy later this morning per Dr. Kendrick Fries. The plan is for the daughter to arrive before 10am. Pt has been NPO since midnight. Thanks! Nursing.

## 2012-09-16 NOTE — Progress Notes (Signed)
INITIAL NUTRITION ASSESSMENT  DOCUMENTATION CODES Per approved criteria  -Severe malnutrition in the context of chronic illness   INTERVENTION: Recommend SLP evaluation to determine most appropriate diet texture and liquid consistency. Add Ensure Complete po BID, each supplement provides 350 kcal and 13 grams of protein. RD to continue to follow nutrition care plan.  NUTRITION DIAGNOSIS: Inadequate oral intake related to acute illness as evidenced by pt report and ongoing weight loss.   Goal: Intake to meet >90% of estimated nutrition needs.  Monitor:  weight trends, lab trends, I/O's, PO intake, supplement toleranc  Reason for Assessment: Malnutrition Screening Tool  79 y.o. female  Admitting Dx: HCAP (healthcare-associated pneumonia)  ASSESSMENT: PMHx significant for HTN, PE, ESRD non on HD. Admitted with cough x weeks, SOB and fevers. Work-up reveals PNA.  CCM evaluated pt and plan for bronchoscopy.  SLP consulted to evaluate swallowing, however was unable to see patient 2/2 NPO for procedure and is now on a Heart Healthy diet per MD orders.  Pt with 13% wt loss x 1 month. Pt confirms ongoing weight loss. Daughter at bedside that pt just grazes throughout the day and eats very little. She will drink Ensure from time to time. Agreeable to receiving while admitted.  Nutrition Focused Physical Exam:  Subcutaneous Fat:  Orbital Region: moderate wasting Upper Arm Region: moderate wasting Thoracic and Lumbar Region: n/a  Muscle:  Temple Region: severe wasting Clavicle Bone Region: severe wasting Clavicle and Acromion Bone Region: moderate wasting Scapular Bone Region: n/a Dorsal Hand: moderate wasting Patellar Region: n/a Anterior Thigh Region: n/a Posterior Calf Region: n/a  Edema: none  Pt meets criteria for severe MALNUTRITION in the context of chronic illness as evidenced by severe muscle mass loss, <75% of estimated energy intake x at least 1 month, and 13% wt  loss x 1 month.  Height: Ht Readings from Last 1 Encounters:  09/15/12 5\' 1"  (1.549 m)    Weight: Wt Readings from Last 1 Encounters:  09/16/12 115 lb 8.3 oz (52.4 kg)    Ideal Body Weight: 105 lb  % Ideal Body Weight: 110%  Wt Readings from Last 10 Encounters:  09/16/12 115 lb 8.3 oz (52.4 kg)  08/06/12 136 lb 9.6 oz (61.961 kg)  07/28/12 136 lb 9.6 oz (61.961 kg)  07/22/12 137 lb 9.6 oz (62.415 kg)  07/16/12 133 lb 3.2 oz (60.419 kg)  07/15/12 133 lb 3.2 oz (60.419 kg)  06/28/12 128 lb (58.06 kg)  06/28/12 128 lb (58.06 kg)  04/26/12 142 lb (64.411 kg)  02/04/12 142 lb (64.411 kg)    Usual Body Weight: 128 - 136 lb  % Usual Body Weight: 87%  BMI:  Body mass index is 21.84 kg/(m^2). Weight is WNL.  Estimated Nutritional Needs: Kcal: 1250 - 1500 Protein: 40 - 50 g Fluid: 1.3 - 1.5 liters daily  Skin: intact  Diet Order: Cardiac  EDUCATION NEEDS: -No education needs identified at this time   Intake/Output Summary (Last 24 hours) at 09/16/12 1422 Last data filed at 09/16/12 1344  Gross per 24 hour  Intake    480 ml  Output   1201 ml  Net   -721 ml    Last BM: 8/27  Labs:   Recent Labs Lab 09/14/12 2227 09/15/12 0540 09/16/12 0420  NA 124* 126* 126*  K 4.1 4.0 4.5  CL 83* 84* 85*  CO2 29 30 30   BUN 64* 66* 61*  CREATININE 2.09* 2.16* 2.32*  CALCIUM 9.2 9.2 9.2  GLUCOSE  106* 116* 92    CBG (last 3)  No results found for this basename: GLUCAP,  in the last 72 hours  Scheduled Meds: . amLODipine  5 mg Oral Daily  . calcitRIOL  0.25 mcg Oral Daily  . cholecalciferol  800 Units Oral Daily  . feeding supplement  237 mL Oral BID BM  . furosemide  40 mg Oral Daily  . HYDROcodone-acetaminophen  1 tablet Oral q morning - 10a  . levothyroxine  75 mcg Oral Daily  . lubiprostone  24 mcg Oral BID WC  . metoprolol  100 mg Oral BID  . multivitamin with minerals  1 tablet Oral Daily  . oxybutynin  10 mg Oral Daily  . pantoprazole  40 mg Oral  Daily  . piperacillin-tazobactam (ZOSYN)  IV  2.25 g Intravenous Q6H  . polyethylene glycol  17 g Oral Daily  . sertraline  25 mg Oral Daily  . [START ON 09/17/2012] vancomycin  750 mg Intravenous Q48H    Continuous Infusions:   Past Medical History  Diagnosis Date  . HTN (hypertension)     2 YEARS  . Hypothyroidism   . Temporal arteritis   . Pulmonary embolism 1997  . Mitral regurgitation     a. mild by 01/2010 echo.  . Chronic diastolic CHF (congestive heart failure)     a. 01/2010 Echo: EF 60%, mild LVH, High LV filling pressures, No RWMA, mild MR, PASP .  Arman Bogus   . Chronic anemia   . Paroxysmal atrial fibrillation   . Pulmonary HTN     a. PASP by 01/2010 echo.  . S/P cardiac cath     a. 12/2009 R Heart Cath:  RA 12, RV 45/11, PA 47/21, PCWP MEAN 26, CO 4.56, CI 2.7.  . Ischemia     a. 12/2009 Myoview: Ant ischemia, EF 75%----NO CATH DONE DUE TO CKD  . End stage renal disease     a. s/p AVF - not on dialysis.  [name] History of urinary frequency   . GERD (gastroesophageal reflux disease)   . Sleep trouble   . Constipation, chronic   . Hard of hearing   . Complication of anesthesia   . PONV (postoperative nausea and vomiting)   . Pneumonia 09/15/2012    Past Surgical History  Procedure Laterality Date  . Temporal arteny bx    . Cataract surgery    . Eye surgery    . Abdominal hysterectomy    . Intramedullary (im) nail intertrochanteric Left 06/30/2012    Procedure: INTRAMEDULLARY (IM) NAIL INTERTROCHANTRIC LEFT;  Surgeon: Harvie Junior, MD;  Location: [facility] OR;  Service: Orthopedics;  Laterality: Left;  . Injection knee Left 06/30/2012    Procedure: Left Knee Aspiration with Intra-Articular Injection;  Surgeon: Harvie Junior, MD;  Location: [facility] OR;  Service: Orthopedics;  Laterality: Left;    Jarold Motto MS, RD, LDN Pager: [phone] After-hours pager: [phone]

## 2012-09-16 NOTE — Progress Notes (Signed)
ANTICOAGULATION CONSULT NOTE - Follow-up  Pharmacy Consult for heparin Indication: atrial fibrillation, hx PE  Allergies  Allergen Reactions  . Codeine Other (See Comments)    unknown  . Sulfa Antibiotics     unknown    Patient Measurements: Height: 5\' 1"  (154.9 cm) Weight: 115 lb 8.3 oz (52.4 kg) (scale C) IBW/kg (Calculated) : 47.8  Vital Signs: Temp: 99.3 F (37.4 C) (08/28 0543) Temp src: Oral (08/28 0543) BP: 117/54 mmHg (08/28 0543) Pulse Rate: 76 (08/28 0543)  Labs:  Recent Labs  09/14/12 2227 09/15/12 0022 09/15/12 0027 09/15/12 0540 09/16/12 0420  HGB 12.0  --   --  10.8* 11.1*  HCT 33.1*  --   --  31.3* 30.8*  PLT 329  --   --  291 284  LABPROT  --  27.5*  --  29.8* 32.6*  INR  --  2.67*  --  2.96* 3.33*  CREATININE 2.09*  --   --  2.16* 2.32*  TROPONINI  --   --  <0.30  --   --     Estimated Creatinine Clearance: 13.4 ml/min (by C-G formula based on Cr of 2.32).  Assessment: 87 yof on chronic coumadin for afib and history of PE. INR is elevated today at 3.33. CBC is stable, no bleeding noted. Coumadin currently on hold for bronch. Heparin to start when INR<2.   Goal of Therapy:  Heparin level 0.3-0.7  Monitor platelets by anticoagulation protocol: Yes   Plan:  1. Start heparin when INR<2 - f/u AM INR 2. F/u restart warfarin  Lysle Pearl, PharmD, BCPS Pager # [phone] 09/16/2012 8:32 AM

## 2012-09-16 NOTE — Progress Notes (Signed)
[facility] PROGRESS NOTE  PEBBLE BOTKIN ZOX:[id number] DOB: April 21, 1925 DOA: 09/14/2012 PCP: Abigail Miyamoto, MD  Assessment/Plan: 79 y.o. female who presents with a history of productive cough for the past several weeks. She has been on ABx as an outpatient for suspected PNA. She finished a 10 day course of this as well as steroids several days ago but continues to have cough, occasional shortness of breath, and ongoing fevers. She presents to the ED today with fever of 101.2, WBC was 26.2k, and her CXR demonstrates a RML infiltrate c/w PNA. Her Creatinine of 2.09 and sodium of 124 appear to be baseline. Hospitalist has been asked to admit for HCAP (PNA felt to be HCAP since she had an admission in June for hip fracture) failed outpatient treatment. Found to have post obstructive pneumonia   1. HCAP/ post obstructive pneumonia, SIRS - CT chest R lung bronch obstruction midd/lowr lobe; awaiting  bronchoscopy per pulmonology, input appriciated; hold coumadin; heparin to be started per pharmacy  -cont zosyn and vancomycin pharm to dose,  -will obtain speech therapy eval  2. [name] - HR controlled; off coumadin for procedure; pharm to dose heparin , cardiac monitor, continue home rate control meds 3. Hyponatremic - chronic and baseline; likely lung related; SIADH;  4. ESRD - technically appears to be CKD stage 4 with GFR of about 20, this appears to be her baseline going back for several months. She does have a dialysis graft in her L arm which was put in Oct of last year, it has a strong pulsatile thrill, this graft has not been used for dialysis yet per patient. Continue home meds, monitor BMP while here but does not appear to be an acute issue at this time.    Code Status: full  Family Communication: daughter at the bedside  (indicate person spoken with, relationship, and if by phone, the number) Disposition Plan: likely home    Consultants:  None   Procedures:  CT    Antibiotics:  Zosyn+vanc  (indicate start date, and stop date if known)  HPI/Subjective: Patient reports cough, no fever, no chest pain   Objective: Filed Vitals:   09/16/12 1043  BP: 112/58  Pulse:   Temp:   Resp:     Intake/Output Summary (Last 24 hours) at 09/16/12 1135 Last data filed at 09/16/12 0807  Gross per 24 hour  Intake    480 ml  Output   1601 ml  Net  -1121 ml   Filed Weights   09/15/12 0100 09/15/12 0223 09/16/12 0543  Weight: 62 kg (136 lb 11 oz) 53.615 kg (118 lb 3.2 oz) 52.4 kg (115 lb 8.3 oz)    Exam:   General:  Alert awake oriented; HOH  Cardiovascular: S1, S2  Respiratory: mild rhonchi   Abdomen: soft, ND, ND   Musculoskeletal: non tender     Data Reviewed: Basic Metabolic Panel:  Recent Labs Lab 09/14/12 2227 09/15/12 0540 09/16/12 0420  NA 124* 126* 126*  K 4.1 4.0 4.5  CL 83* 84* 85*  CO2 29 30 30   GLUCOSE 106* 116* 92  BUN 64* 66* 61*  CREATININE 2.09* 2.16* 2.32*  CALCIUM 9.2 9.2 9.2   Liver Function Tests:  Recent Labs Lab 09/14/12 2227  AST 28  ALT 13  ALKPHOS 107  BILITOT 1.0  PROT 7.8  ALBUMIN 3.2*   No results found for this basename: LIPASE, AMYLASE,  in the last 168 hours No results found for this basename: AMMONIA,  in  the last 168 hours CBC:  Recent Labs Lab 09/14/12 1421 09/14/12 2227 09/15/12 0540 09/16/12 0420  WBC  --  26.2* 22.4* 22.1*  NEUTROABS  --  22.8*  --   --   HGB 11.5* 12.0 10.8* 11.1*  HCT  --  33.1* 31.3* 30.8*  MCV  --  100.0 101.6* 100.0  PLT  --  329 291 284   Cardiac Enzymes:  Recent Labs Lab 09/15/12 0027  TROPONINI <0.30   BNP (last 3 results) No results found for this basename: PROBNP,  in the last 8760 hours CBG: No results found for this basename: GLUCAP,  in the last 168 hours  Recent Results (from the past 240 hour(s))  CULTURE, BLOOD (ROUTINE X 2)     Status: None   Collection Time    09/15/12 12:20 AM      Result Value Range Status   Specimen  Description BLOOD RIGHT ARM   Final   Special Requests     Final   Value: BOTTLES DRAWN AEROBIC AND ANAEROBIC 10CC BLUE 8CC RED   Culture  Setup Time     Final   Value: 09/15/2012 11:06     Performed at [facility]   Culture     Final   Value:        BLOOD CULTURE RECEIVED NO GROWTH TO DATE CULTURE WILL BE HELD FOR 5 DAYS BEFORE ISSUING A FINAL NEGATIVE REPORT     Performed at [facility]   Report Status PENDING   Incomplete  CULTURE, BLOOD (ROUTINE X 2)     Status: None   Collection Time    09/15/12 12:30 AM      Result Value Range Status   Specimen Description BLOOD RIGHT WRIST   Final   Special Requests BOTTLES DRAWN AEROBIC ONLY [facility]   Final   Culture  Setup Time     Final   Value: 09/15/2012 11:06     Performed at [facility]   Culture     Final   Value:        BLOOD CULTURE RECEIVED NO GROWTH TO DATE CULTURE WILL BE HELD FOR 5 DAYS BEFORE ISSUING A FINAL NEGATIVE REPORT     Performed at [facility]   Report Status PENDING   Incomplete  CULTURE, EXPECTORATED SPUTUM-ASSESSMENT     Status: None   Collection Time    09/15/12  9:11 AM      Result Value Range Status   Specimen Description SPUTUM   Final   Special Requests NONE   Final   Sputum evaluation     Final   Value: THIS SPECIMEN IS ACCEPTABLE. RESPIRATORY CULTURE REPORT TO FOLLOW.   Report Status 09/15/2012 FINAL   Final  CULTURE, RESPIRATORY (NON-EXPECTORATED)     Status: None   Collection Time    09/15/12  9:11 AM      Result Value Range Status   Specimen Description SPUTUM   Final   Special Requests NONE   Final   Gram Stain     Final   Value: ABUNDANT WBC PRESENT,BOTH PMN AND MONONUCLEAR     NO SQUAMOUS EPITHELIAL CELLS SEEN     MODERATE GRAM POSITIVE COCCI     IN PAIRS IN CLUSTERS RARE GRAM NEGATIVE RODS     RARE YEAST   Culture     Final   Value: Culture reincubated for better growth     Performed at [facility]   Report  Status PENDING   Incomplete  GRAM STAIN      Status: None   Collection Time    09/15/12  9:12 AM      Result Value Range Status   Specimen Description SPUTUM   Final   Special Requests NONE   Final   Gram Stain     Final   Value: ABUNDANT WBC PRESENT,BOTH PMN AND MONONUCLEAR     MODERATE GRAM POSITIVE COCCI IN PAIRS IN CLUSTERS     RARE GRAM NEGATIVE RODS     RARE YEAST   Report Status 09/15/2012 FINAL   Final     Studies: Dg Chest 2 View  09/14/2012   *RADIOLOGY REPORT*  Clinical Data: Cough, fever.  CHEST - 2 VIEW  Comparison: 06/28/2012  Findings: Right middle lobe consolidation. Background interstitial coarsening and hyperinflation. There may be trace fluid along the right fissure.  Otherwise, no pleural effusion or pneumothorax. The Aortic tortuosity atherosclerosis without dilatation.  Upper normal heart size.  Osteopenia without acute osseous finding.  IMPRESSION: Right middle lobe pneumonia.  Recommend radiograph follow-up to resolution.  COPD.   Original Report Authenticated By: Jearld Lesch, M.D.   Ct Chest Wo Contrast  09/15/2012   *RADIOLOGY REPORT*  Clinical Data: Chronic pneumonia  CT CHEST WITHOUT CONTRAST  Technique:  Multidetector CT imaging of the chest was performed following the standard protocol without IV contrast.  Comparison: 09/14/2012.  Findings: Dense airspace consolidation involves the right middle lobe.  There is near complete occlusion of the proximal right middle lobe bronchus, image 31/series 3.Pain there is no airspace consolidation in the are identified within the right lower lobe. There is evidence of right middle lobe and right lower lobe bronchiectasis and bronchial wall thickening.  There is prominence of the pulmonary interstitium within the right lower lobe with interlobular septal thickening.  The left lung is relatively clear.  Normal heart size.  No pericardial effusion.  Enlarged mediastinal lymph nodes are identified.  Higher right paratracheal lymph node measures 1.2 cm, image 17/series 2.   There is a lower right paratracheal lymph node measuring 1.3 cm, image 22/series 2.  The no left hilar adenopathy.  There is no supraclavicular or axillary adenopathy.  Limited imaging through the upper abdomen shows no acute findings.  Bones appear mildly osteopenic and there is multilevel spondylosis within the thoracic spine.  IMPRESSION:  1.  Dense airspace consolidation involves the right middle lobe and there is patchy airspace disease involving the right lower lobe. This is a nonspecific finding and may reflect multifocal infection, sequelae of aspiration or tumor. 2.  There is complete occlusion of the right middle lobe bronchus and partial occlusion of the right lower lobe bronchus.  Findings may reflect aspiration, mucous plugging or intraluminal tumor. Advise further evaluation with bronchoscopy. 3.  Interlobular septal thickening within the right lower lobe is nonspecific but can be seen with lymphangitic spread of tumor. 4.  Enlarged right paratracheal lymph nodes are nonspecific. In the setting of infection these may be reactive in etiology. Differential considerations include metastatic adenopathy, granulomatous disease or lymphoma.   Original Report Authenticated By: Signa Kell, M.D.    Scheduled Meds: . amLODipine  5 mg Oral Daily  . calcitRIOL  0.25 mcg Oral Daily  . cholecalciferol  800 Units Oral Daily  . furosemide  40 mg Oral Daily  . HYDROcodone-acetaminophen  1 tablet Oral q morning - 10a  . levothyroxine  75 mcg Oral Daily  . lubiprostone  24 mcg  Oral BID WC  . metoprolol  100 mg Oral BID  . multivitamin with minerals  1 tablet Oral Daily  . oxybutynin  10 mg Oral Daily  . pantoprazole  40 mg Oral Daily  . piperacillin-tazobactam (ZOSYN)  IV  2.25 g Intravenous Q6H  . polyethylene glycol  17 g Oral Daily  . sertraline  25 mg Oral Daily  . [START ON 09/17/2012] vancomycin  750 mg Intravenous Q48H   Continuous Infusions:   Principal Problem:   HCAP  (healthcare-associated pneumonia) Active Problems:   Atrial fibrillation   Hyponatremia   ESRD (end stage renal disease)    Time spent: > 25 minutes     Esperanza Sheets  [facility] Pager [phone]. If 7PM-7AM, please contact night-coverage at [email or web address], password [facility] 09/16/2012, 11:35 AM  LOS: 2 days

## 2012-09-17 ENCOUNTER — Inpatient Hospital Stay (HOSPITAL_COMMUNITY): Payer: Medicare Other

## 2012-09-17 LAB — BASIC METABOLIC PANEL WITH GFR
BUN: 56 mg/dL — ABNORMAL HIGH (ref 6–23)
CO2: 24 meq/L (ref 19–32)
Calcium: 8.7 mg/dL (ref 8.4–10.5)
Chloride: 86 meq/L — ABNORMAL LOW (ref 96–112)
Creatinine, Ser: 2.61 mg/dL — ABNORMAL HIGH (ref 0.50–1.10)
GFR calc Af Amer: 18 mL/min — ABNORMAL LOW
GFR calc non Af Amer: 16 mL/min — ABNORMAL LOW
Glucose, Bld: 109 mg/dL — ABNORMAL HIGH (ref 70–99)
Potassium: 3.1 meq/L — ABNORMAL LOW (ref 3.5–5.1)
Sodium: 127 meq/L — ABNORMAL LOW (ref 135–145)

## 2012-09-17 LAB — CBC
Hemoglobin: 10.7 g/dL — ABNORMAL LOW (ref 12.0–15.0)
MCHC: 35.4 g/dL (ref 30.0–36.0)
Platelets: 315 10*3/uL (ref 150–400)
RDW: 17.6 % — ABNORMAL HIGH (ref 11.5–15.5)

## 2012-09-17 LAB — PROTIME-INR
INR: 3.25 — ABNORMAL HIGH (ref 0.00–1.49)
Prothrombin Time: 32 s — ABNORMAL HIGH (ref 11.6–15.2)

## 2012-09-17 MED ORDER — POTASSIUM CHLORIDE CRYS ER 20 MEQ PO TBCR
40.0000 meq | EXTENDED_RELEASE_TABLET | Freq: Once | ORAL | Status: AC
  Administered 2012-09-17: 40 meq via ORAL
  Filled 2012-09-17: qty 2

## 2012-09-17 MED ORDER — AMLODIPINE BESYLATE 2.5 MG PO TABS
2.5000 mg | ORAL_TABLET | Freq: Every day | ORAL | Status: DC
  Administered 2012-09-18 – 2012-09-21 (×4): 2.5 mg via ORAL
  Filled 2012-09-17 (×4): qty 1

## 2012-09-17 MED ORDER — LORAZEPAM 0.5 MG PO TABS
0.5000 mg | ORAL_TABLET | Freq: Every evening | ORAL | Status: DC | PRN
  Administered 2012-09-17 – 2012-09-25 (×8): 0.5 mg via ORAL
  Filled 2012-09-17 (×9): qty 1

## 2012-09-17 NOTE — Procedures (Signed)
Objective Swallowing Evaluation: Modified Barium Swallowing Study  Patient Details  Name: Cheryl [name] MRN: [id number] Date of Birth: Dec 01, 1925  Today's Date: 09/17/2012 Time: 1335-1400 SLP Time Calculation (min): 25 min  Past Medical History:  Past Medical History  Diagnosis Date  . HTN (hypertension)     2 YEARS  . Hypothyroidism   . Temporal arteritis   . Pulmonary embolism 1997  . Mitral regurgitation     a. mild by 01/2010 echo.  . Chronic diastolic CHF (congestive heart failure)     a. 01/2010 Echo: EF 60%, mild LVH, High LV filling pressures, No RWMA, mild MR, PASP .  Cheryl [name]   . Chronic anemia   . Paroxysmal atrial fibrillation   . Pulmonary HTN     a. PASP by 01/2010 echo.  . S/P cardiac cath     a. 12/2009 R Heart Cath:  RA 12, RV 45/11, PA 47/21, PCWP MEAN 26, CO 4.56, CI 2.7.  . Ischemia     a. 12/2009 Myoview: Ant ischemia, EF 75%----NO CATH DONE DUE TO CKD  . End stage renal disease     a. s/p AVF - not on dialysis.  [name] History of urinary frequency   . GERD (gastroesophageal reflux disease)   . Sleep trouble   . Constipation, chronic   . Hard of hearing   . Complication of anesthesia   . PONV (postoperative nausea and vomiting)   . Pneumonia 09/15/2012   Past Surgical History:  Past Surgical History  Procedure Laterality Date  . Temporal arteny bx    . Cataract surgery    . Eye surgery    . Abdominal hysterectomy    . Intramedullary (im) nail intertrochanteric Left 06/30/2012    Procedure: INTRAMEDULLARY (IM) NAIL INTERTROCHANTRIC LEFT;  Surgeon: Harvie Junior, MD;  Location: [facility] OR;  Service: Orthopedics;  Laterality: Left;  . Injection knee Left 06/30/2012    Procedure: Left Knee Aspiration with Intra-Articular Injection;  Surgeon: Harvie Junior, MD;  Location: [facility] OR;  Service: Orthopedics;  Laterality: Left;   HPI:  79 year old female admitted 09/14/12 with RML PNA, patchy airspace disease of RLL. PMH significant for COPD, GERD, hip  fx June 2014. BSE ordered to assess swallow function and safety, and did not reveal overt s/s aspiration.  Objective study was recommended due to risk of silent aspiration.     Assessment / Plan / Recommendation Clinical Impression  Dysphagia Diagnosis: Mild pharyngeal phase dysphagia Clinical impression: Mild sensory and motor based pharyngeal dysphagia, characterized by delayed swallow reflex, intermittent trace penetration of thin liquids, and vallecular residue of puree and cracker.  Oral phase appears to be [facility].  Reflex trigger was noted to occur at the vallecula on puree, cracker, and intermittently on thin liquids.  Spillage to pyriform was also seen intermitently on thin liquids.  Trace penetration was seen during the swallow of thin liquids, and was removed with cued throat clear.  Penetration is considered within normal limits for this age group.  Will modify diet for energy conservation, and provide safe swallow precautions (upright, remain upright 30 minutes after meals, clear throat occasionally).  Recommend meds whole in puree, due to large bolus size needed to take meds with thin liquid.  ST to follow briefly for diet tolerance and education.    Treatment Recommendation  Therapy as outlined in treatment plan below    Diet Recommendation Dysphagia 3 (Mechanical Soft);Thin liquid (chop meats)   Liquid Administration via: Cup;Straw Medication  Administration: Whole meds with puree Supervision: Patient able to self feed Compensations: Slow rate;Small sips/bites;Clear throat intermittently Postural Changes and/or Swallow Maneuvers: Seated upright 90 degrees;Upright 30-60 min after meal    Other  Recommendations Oral Care Recommendations: Oral care QID Other Recommendations: Clarify dietary restrictions   Follow Up Recommendations  24 hour supervision/assistance    Frequency and Duration min 1 x/week  1 week   Pertinent Vitals/Pain No pain reported   SLP Swallow Goals Patient  will consume recommended diet without observed clinical signs of aspiration with: Set-up Swallow Study Goal #1 - Progress: Progressing toward goal Patient will utilize recommended strategies during swallow to increase swallowing safety with: Set-up Swallow Study Goal #2 - Progress: Progressing toward goal   General Date of Onset: 09/14/12 HPI: 79 year old female admitted 09/14/12 with RML PNA, patchy airspace disease of RLL. PMH significant for COPD, GERD, hip fx June 2014. BSE ordered to assess swallow function and safety, and did not reveal overt s/s aspiration.  Objective study was recommended due to risk of silent aspiration. Type of Study: Modified Barium Swallowing Study Reason for Referral: Objectively evaluate swallowing function Previous Swallow Assessment: BSE 09/17/12 Diet Prior to this Study: Regular;Thin liquids Temperature Spikes Noted: No Respiratory Status: Room air History of Recent Intubation: No Behavior/Cognition: Alert;Cooperative;Hard of hearing;Pleasant mood Oral Cavity - Dentition: Dentures, bottom;Dentures, top Oral Motor / Sensory Function: Within functional limits Self-Feeding Abilities: Able to feed self Patient Positioning: Upright in chair Baseline Vocal Quality: Clear Volitional Cough: Strong Volitional Swallow: Able to elicit Anatomy: Within functional limits Pharyngeal Secretions: Not observed secondary MBS    Reason for Referral Objectively evaluate swallowing function   Oral Phase Oral Preparation/Oral Phase Oral Phase: WFL   Pharyngeal Phase Pharyngeal Phase Pharyngeal Phase: Impaired Pharyngeal - Thin Pharyngeal - Thin Cup: Delayed swallow initiation;Premature spillage to valleculae;Premature spillage to pyriform sinuses;Penetration/Aspiration during swallow;Reduced airway/laryngeal closure;Compensatory strategies attempted (Comment) (small sip, throat clear) Penetration/Aspiration details (thin cup): Material enters airway, passes BELOW cords then  ejected out;Material does not enter airway;Material enters airway, remains ABOVE vocal cords and not ejected out;Material enters airway, remains ABOVE vocal cords then ejected out Pharyngeal - Thin Straw: Premature spillage to valleculae;Premature spillage to pyriform sinuses;Delayed swallow initiation;Penetration/Aspiration during swallow;Reduced airway/laryngeal closure;Compensatory strategies attempted (Comment) (small sip, throat clear) Penetration/Aspiration details (thin straw): Material does not enter airway;Material enters airway, remains ABOVE vocal cords then ejected out;Material enters airway, remains ABOVE vocal cords and not ejected out Pharyngeal - Solids Pharyngeal - Puree: Premature spillage to valleculae;Delayed swallow initiation;Reduced tongue base retraction;Pharyngeal residue - valleculae;Compensatory strategies attempted (Comment) (dry swallow) Pharyngeal - Multi-consistency: Delayed swallow initiation;Premature spillage to valleculae;Reduced tongue base retraction;Pharyngeal residue - valleculae (dry swallow)  Cervical Esophageal Phase    GO Celia B. Murvin Natal [facility], CCC-SLP [phone]   Cervical Esophageal Phase Cervical Esophageal Phase: [facility]         Leigh Aurora 09/17/2012, 3:03 PM

## 2012-09-17 NOTE — Progress Notes (Addendum)
[facility] PROGRESS NOTE  Cheryl [name] OZD:[id number] DOB: 08-23-1925 DOA: 09/14/2012 PCP: Abigail Miyamoto, MD  Assessment/Plan: 79 y.o. female who presents with a history of productive cough for the past several weeks. She has been on ABx as an outpatient for suspected PNA. She finished a 10 day course of this as well as steroids several days ago but continues to have cough, occasional shortness of breath, and ongoing fevers. She presents to the ED today with fever of 101.2, WBC was 26.2k, and her CXR demonstrates a RML infiltrate c/w PNA. Her Creatinine of 2.09 and sodium of 124 appear to be baseline. Hospitalist has been asked to admit for HCAP (PNA felt to be HCAP since she had an admission in June for hip fracture) failed outpatient treatment. Found to have post obstructive pneumonia   1. HCAP/ post obstructive pneumonia, SIRS - CT chest R lung bronch obstruction midd/lowr lobe; awaiting  bronchoscopy per pulmonology, input appriciated; hold coumadin; heparin to be started per pharmacy  -cont zosyn and vancomycin pharm to dose,  -will obtain speech therapy eval  2. [name] - HR controlled; off coumadin for procedure; pharm to dose heparin , cardiac monitor, continue home rate control meds -hold lasix due to low BP 3. Hyponatremic - chronic and baseline; likely lung related; SIADH;  4. ESRD - technically appears to be CKD stage 4 with GFR of about 20, this appears to be her baseline going back for several months. She does have a dialysis graft in her L arm which was put in Oct of last year, it has a strong pulsatile thrill, this graft has not been used for dialysis yet per patient. Continue home meds, monitor BMP while here but does not appear to be an acute issue at this time. -hold lasix due to low BP  5. HTN low BP; decrease amlodipine from 5-->2.5 (8/29); hold lasix (home dose 40 mg); cont BB due to a fib; monitor  6. Hypothyroidism; on levothyroxine; recheck tsh  Code  Status: full  Family Communication: daughter at the bedside  (indicate person spoken with, relationship, and if by phone, the number) Disposition Plan: likely home    Consultants:  None   Procedures:  CT   Antibiotics:  Zosyn+vanc  (indicate start date, and stop date if known)  HPI/Subjective: Patient reports cough, no fever, no chest pain   Objective: Filed Vitals:   09/17/12 0700  BP: 102/41  Pulse:   Temp:   Resp:     Intake/Output Summary (Last 24 hours) at 09/17/12 1041 Last data filed at 09/17/12 0531  Gross per 24 hour  Intake    730 ml  Output    561 ml  Net    169 ml   Filed Weights   09/15/12 0223 09/16/12 0543 09/17/12 0602  Weight: 53.615 kg (118 lb 3.2 oz) 52.4 kg (115 lb 8.3 oz) 51.3 kg (113 lb 1.5 oz)    Exam:   General:  Alert awake oriented; HOH  Cardiovascular: S1, S2  Respiratory: mild rhonchi   Abdomen: soft, ND, ND   Musculoskeletal: non tender     Data Reviewed: Basic Metabolic Panel:  Recent Labs Lab 09/14/12 2227 09/15/12 0540 09/16/12 0420 09/17/12 0550  NA 124* 126* 126* 127*  K 4.1 4.0 4.5 3.1*  CL 83* 84* 85* 86*  CO2 29 30 30 24   GLUCOSE 106* 116* 92 109*  BUN 64* 66* 61* 56*  CREATININE 2.09* 2.16* 2.32* 2.61*  CALCIUM 9.2 9.2 9.2 8.7  Liver Function Tests:  Recent Labs Lab 09/14/12 2227  AST 28  ALT 13  ALKPHOS 107  BILITOT 1.0  PROT 7.8  ALBUMIN 3.2*   No results found for this basename: LIPASE, AMYLASE,  in the last 168 hours No results found for this basename: AMMONIA,  in the last 168 hours CBC:  Recent Labs Lab 09/14/12 1421 09/14/12 2227 09/15/12 0540 09/16/12 0420 09/17/12 0550  WBC  --  26.2* 22.4* 22.1* 16.0*  NEUTROABS  --  22.8*  --   --   --   HGB 11.5* 12.0 10.8* 11.1* 10.7*  HCT  --  33.1* 31.3* 30.8* 30.2*  MCV  --  100.0 101.6* 100.0 100.3*  PLT  --  329 291 284 315   Cardiac Enzymes:  Recent Labs Lab 09/15/12 0027  TROPONINI <0.30   BNP (last 3 results) No  results found for this basename: PROBNP,  in the last 8760 hours CBG: No results found for this basename: GLUCAP,  in the last 168 hours  Recent Results (from the past 240 hour(s))  CULTURE, BLOOD (ROUTINE X 2)     Status: None   Collection Time    09/15/12 12:20 AM      Result Value Range Status   Specimen Description BLOOD RIGHT ARM   Final   Special Requests     Final   Value: BOTTLES DRAWN AEROBIC AND ANAEROBIC 10CC BLUE 8CC RED   Culture  Setup Time     Final   Value: 09/15/2012 11:06     Performed at [facility]   Culture     Final   Value:        BLOOD CULTURE RECEIVED NO GROWTH TO DATE CULTURE WILL BE HELD FOR 5 DAYS BEFORE ISSUING A FINAL NEGATIVE REPORT     Performed at [facility]   Report Status PENDING   Incomplete  CULTURE, BLOOD (ROUTINE X 2)     Status: None   Collection Time    09/15/12 12:30 AM      Result Value Range Status   Specimen Description BLOOD RIGHT WRIST   Final   Special Requests BOTTLES DRAWN AEROBIC ONLY [facility]   Final   Culture  Setup Time     Final   Value: 09/15/2012 11:06     Performed at [facility]   Culture     Final   Value:        BLOOD CULTURE RECEIVED NO GROWTH TO DATE CULTURE WILL BE HELD FOR 5 DAYS BEFORE ISSUING A FINAL NEGATIVE REPORT     Performed at [facility]   Report Status PENDING   Incomplete  CULTURE, EXPECTORATED SPUTUM-ASSESSMENT     Status: None   Collection Time    09/15/12  9:11 AM      Result Value Range Status   Specimen Description SPUTUM   Final   Special Requests NONE   Final   Sputum evaluation     Final   Value: THIS SPECIMEN IS ACCEPTABLE. RESPIRATORY CULTURE REPORT TO FOLLOW.   Report Status 09/15/2012 FINAL   Final  CULTURE, RESPIRATORY (NON-EXPECTORATED)     Status: None   Collection Time    09/15/12  9:11 AM      Result Value Range Status   Specimen Description SPUTUM   Final   Special Requests NONE   Final   Gram Stain     Final   Value: ABUNDANT WBC PRESENT,BOTH  PMN AND MONONUCLEAR  NO SQUAMOUS EPITHELIAL CELLS SEEN     MODERATE GRAM POSITIVE COCCI     IN PAIRS IN CLUSTERS RARE GRAM NEGATIVE RODS     RARE YEAST   Culture     Final   Value: ABUNDANT STAPHYLOCOCCUS AUREUS     Note: RIFAMPIN AND GENTAMICIN SHOULD NOT BE USED AS SINGLE DRUGS FOR TREATMENT OF STAPH INFECTIONS.     ABUNDANT GRAM NEGATIVE RODS     Performed at [facility]   Report Status PENDING   Incomplete  GRAM STAIN     Status: None   Collection Time    09/15/12  9:12 AM      Result Value Range Status   Specimen Description SPUTUM   Final   Special Requests NONE   Final   Gram Stain     Final   Value: ABUNDANT WBC PRESENT,BOTH PMN AND MONONUCLEAR     MODERATE GRAM POSITIVE COCCI IN PAIRS IN CLUSTERS     RARE GRAM NEGATIVE RODS     RARE YEAST   Report Status 09/15/2012 FINAL   Final     Studies: Ct Chest Wo Contrast  09/15/2012   *RADIOLOGY REPORT*  Clinical Data: Chronic pneumonia  CT CHEST WITHOUT CONTRAST  Technique:  Multidetector CT imaging of the chest was performed following the standard protocol without IV contrast.  Comparison: 09/14/2012.  Findings: Dense airspace consolidation involves the right middle lobe.  There is near complete occlusion of the proximal right middle lobe bronchus, image 31/series 3.Pain there is no airspace consolidation in the are identified within the right lower lobe. There is evidence of right middle lobe and right lower lobe bronchiectasis and bronchial wall thickening.  There is prominence of the pulmonary interstitium within the right lower lobe with interlobular septal thickening.  The left lung is relatively clear.  Normal heart size.  No pericardial effusion.  Enlarged mediastinal lymph nodes are identified.  Higher right paratracheal lymph node measures 1.2 cm, image 17/series 2.  There is a lower right paratracheal lymph node measuring 1.3 cm, image 22/series 2.  The no left hilar adenopathy.  There is no supraclavicular or  axillary adenopathy.  Limited imaging through the upper abdomen shows no acute findings.  Bones appear mildly osteopenic and there is multilevel spondylosis within the thoracic spine.  IMPRESSION:  1.  Dense airspace consolidation involves the right middle lobe and there is patchy airspace disease involving the right lower lobe. This is a nonspecific finding and may reflect multifocal infection, sequelae of aspiration or tumor. 2.  There is complete occlusion of the right middle lobe bronchus and partial occlusion of the right lower lobe bronchus.  Findings may reflect aspiration, mucous plugging or intraluminal tumor. Advise further evaluation with bronchoscopy. 3.  Interlobular septal thickening within the right lower lobe is nonspecific but can be seen with lymphangitic spread of tumor. 4.  Enlarged right paratracheal lymph nodes are nonspecific. In the setting of infection these may be reactive in etiology. Differential considerations include metastatic adenopathy, granulomatous disease or lymphoma.   Original Report Authenticated By: Signa Kell, M.D.    Scheduled Meds: . amLODipine  5 mg Oral Daily  . calcitRIOL  0.25 mcg Oral Daily  . cholecalciferol  800 Units Oral Daily  . feeding supplement  237 mL Oral BID BM  . furosemide  40 mg Oral Daily  . HYDROcodone-acetaminophen  1 tablet Oral q morning - 10a  . levothyroxine  75 mcg Oral Daily  . lubiprostone  24  mcg Oral BID WC  . metoprolol  100 mg Oral BID  . multivitamin with minerals  1 tablet Oral Daily  . oxybutynin  10 mg Oral Daily  . pantoprazole  40 mg Oral Daily  . piperacillin-tazobactam (ZOSYN)  IV  2.25 g Intravenous Q6H  . polyethylene glycol  17 g Oral Daily  . sertraline  25 mg Oral Daily  . vancomycin  750 mg Intravenous Q48H   Continuous Infusions:   Principal Problem:   HCAP (healthcare-associated pneumonia) Active Problems:   Atrial fibrillation   Hyponatremia   ESRD (end stage renal disease)    Time spent: >  25 minutes     Esperanza Sheets  [facility] Pager [phone]. If 7PM-7AM, please contact night-coverage at [email or web address], password [facility] 09/17/2012, 10:41 AM  LOS: 3 days

## 2012-09-17 NOTE — Progress Notes (Signed)
INR remains elevated. We will check on her again Monday 9/1 and consider setting up for FOB Tues if coags correct. IF ready for discharge otherwise, we can arrange for outpt FOB  Billy Fischer, MD ; [facility] [phone].  After 5:30 PM or weekends, call [phone]

## 2012-09-17 NOTE — Evaluation (Signed)
Clinical/Bedside Swallow Evaluation Patient Details  Name: Cheryl [name] MRN: [id number] Date of Birth: Jun 03, 1925  Today's Date: 09/17/2012 Time: 1010-1054 SLP Time Calculation (min): 44 min  Past Medical History:  Past Medical History  Diagnosis Date  . HTN (hypertension)     2 YEARS  . Hypothyroidism   . Temporal arteritis   . Pulmonary embolism 1997  . Mitral regurgitation     a. mild by 01/2010 echo.  . Chronic diastolic CHF (congestive heart failure)     a. 01/2010 Echo: EF 60%, mild LVH, High LV filling pressures, No RWMA, mild MR, PASP .  Arman Bogus   . Chronic anemia   . Paroxysmal atrial fibrillation   . Pulmonary HTN     a. PASP by 01/2010 echo.  . S/P cardiac cath     a. 12/2009 R Heart Cath:  RA 12, RV 45/11, PA 47/21, PCWP MEAN 26, CO 4.56, CI 2.7.  . Ischemia     a. 12/2009 Myoview: Ant ischemia, EF 75%----NO CATH DONE DUE TO CKD  . End stage renal disease     a. s/p AVF - not on dialysis.  Cheryl [name] History of urinary frequency   . GERD (gastroesophageal reflux disease)   . Sleep trouble   . Constipation, chronic   . Hard of hearing   . Complication of anesthesia   . PONV (postoperative nausea and vomiting)   . Pneumonia 09/15/2012   Past Surgical History:  Past Surgical History  Procedure Laterality Date  . Temporal arteny bx    . Cataract surgery    . Eye surgery    . Abdominal hysterectomy    . Intramedullary (im) nail intertrochanteric Left 06/30/2012    Procedure: INTRAMEDULLARY (IM) NAIL INTERTROCHANTRIC LEFT;  Surgeon: Harvie Junior, MD;  Location: [facility] OR;  Service: Orthopedics;  Laterality: Left;  . Injection knee Left 06/30/2012    Procedure: Left Knee Aspiration with Intra-Articular Injection;  Surgeon: Harvie Junior, MD;  Location: [facility] OR;  Service: Orthopedics;  Laterality: Left;   HPI:  79 year old female admitted 09/14/12 with RML PNA, patchy airspace disease of RLL. PMH significant for COPD, GERD, hip fx June 2014. BSE ordered to  assess swallow function and safety.   Assessment / Plan / Recommendation Clinical Impression  Pt completed oral care with set up.  Upper and lower dentures cleaned and replaced. Pt exhibits normal oral motor strength and function.  No overt s/s observed or reported during BSE, however, pt has hx of GERD and COPD, which increases risk of silent aspiration.  Pt also reported a 30 pound weight loss since hip fx/surgery in June of this year.  Based on collected information, MBS is warranted to objectively evaluate swallow function and safety.  Scheduled for 1330 in radiology, today. MD, RN aware.    Aspiration Risk  Mild    Diet Recommendation Regular;Thin liquid   Liquid Administration via: Cup;Straw Medication Administration: Whole meds with liquid Supervision: Patient able to self feed Compensations: Slow rate;Small sips/bites Postural Changes and/or Swallow Maneuvers: Seated upright 90 degrees;Upright 30-60 min after meal    Other  Recommendations Recommended Consults: MBS Oral Care Recommendations: Oral care QID Other Recommendations: Clarify dietary restrictions   Follow Up Recommendations   ( pending MBS results/recs)    Frequency and Duration        Pertinent Vitals/Pain No pain unless walking    SLP Swallow Goals Patient will consume recommended diet without observed clinical signs of aspiration with:  Set-up Swallow Study Goal #1 - Progress: Progressing toward goal Patient will utilize recommended strategies during swallow to increase swallowing safety with: Set-up Swallow Study Goal #2 - Progress: Progressing toward goal   Swallow Study Prior Functional Status   Tolerating regular diet/thin liquid prior to admit. Pt reports 30# weight loss since hip fx/surgery in June 2014.    General Date of Onset: 09/14/12 HPI: 79 year old female admitted 09/14/12 with RML PNA, patchy airspace disease of RLL. PMH significant for COPD, GERD, hip fx June 2014. BSE ordered to assess  swallow function and safety. Type of Study: Bedside swallow evaluation Diet Prior to this Study: Regular;Thin liquids Temperature Spikes Noted: No Respiratory Status: Room air History of Recent Intubation: No Behavior/Cognition: Alert;Cooperative;Hard of hearing;Pleasant mood Oral Cavity - Dentition: Dentures, bottom;Dentures, top Self-Feeding Abilities: Able to feed self Patient Positioning:  (upright at EOB) Baseline Vocal Quality: Clear Volitional Cough: Strong Volitional Swallow: Able to elicit    Oral/Motor/Sensory Function Overall Oral Motor/Sensory Function: Appears within functional limits for tasks assessed   Ice Chips Ice chips: Not tested   Thin Liquid Thin Liquid: Within functional limits Presentation: Cup    Nectar Thick Nectar Thick Liquid: Not tested   Honey Thick Honey Thick Liquid: Not tested   Puree Puree: Within functional limits Presentation: Self Fed;Spoon   Solid   GO    Solid: Within functional limits Presentation: Self Fed      Cheryl [name] [facility], CCC-SLP [phone]  Leigh Aurora 09/17/2012,11:08 AM

## 2012-09-17 NOTE — Progress Notes (Signed)
Pt's cardiac rhythm shifted to Afib from NSR, HR running 100's-130's, asymptomatic, denies any chest pain, not in respiratory distress. Will continue to monitor pt.

## 2012-09-17 NOTE — Progress Notes (Signed)
ANTICOAGULATION CONSULT NOTE - Follow-up  Pharmacy Consult for heparin when INR < 2 Indication: atrial fibrillation, hx PE  Allergies  Allergen Reactions  . Codeine Other (See Comments)    unknown  . Sulfa Antibiotics     unknown    Patient Measurements: Height: 5\' 1"  (154.9 cm) Weight: 113 lb 1.5 oz (51.3 kg) (scale c) IBW/kg (Calculated) : 47.8  Vital Signs: Temp: 97.9 F (36.6 C) (08/29 0602) Temp src: Oral (08/29 0602) BP: 102/41 mmHg (08/29 0700) Pulse Rate: 110 (08/29 0602)  Labs:  Recent Labs  09/14/12 2227  09/15/12 0027 09/15/12 0540 09/16/12 0420 09/17/12 0550  HGB 12.0  --   --  10.8* 11.1* 10.7*  HCT 33.1*  --   --  31.3* 30.8* 30.2*  PLT 329  --   --  291 284 315  LABPROT  --   < >  --  29.8* 32.6* 32.0*  INR  --   < >  --  2.96* 3.33* 3.25*  CREATININE 2.09*  --   --  2.16* 2.32*  --   TROPONINI  --   --  <0.30  --   --   --   < > = values in this interval not displayed.  Estimated Creatinine Clearance: 13.4 ml/min (by C-G formula based on Cr of 2.32).  79 yo F admitted 09/14/2012  with PNA, failed 10 day outpatient course abx and steroids. Pharmacy consulted to dose warfarin when INR <2, vancomycin and zosyn.  PMH: HTN, hypothyroid, PE, MR, CHF, anemia, afib, pulm HTN, ESRD, GERD  Events: went into Afib early this am.  AC: PE/afib hx Heparin when INR<2 - INR remains elevated, H/H low but stable, no bleeding noted  ID:  HCAP (admit in June) - Tmax 100.1, WBC trend down,  doses appropriate  Vanc 8/27>> Zosyn 8/27>>  8/27 sputum - ngtd 8/27 BCx2 - ngtd  CV: Hx HTN, Afib, pulm HTN, CHF (no recent ECHO) - BP 80-110, HR 60-110 - amlodipine, lasix, metoprolol   Endo: Hx hypothyroid - synthroid  GI/Nutrition: Hx GERD, chronic constipation - - PPI, vit D, amitiza, mvi, miralax  Neuro: zoloft  Renal: CKD stage 4 (not on HD but has graft), SCr 2.32 (slight trend up). Lytes ok except Na 126 - oxybut ER  Best Practices: Coumadin  Plan: 1. No  heparin today - f/u AM INR 2. F/u restart coumadin 3. Continue vanc 750mg  IV Q48H 4. Continue zosyn 2.25gm IV Q6H 5. F/u renal fxn, C&S, clinical status and trough at [facility]   Thank you for allowing pharmacy to be a part of this patients care team.  Lovenia Kim Pharm.D., BCPS Clinical Pharmacist 09/17/2012 7:46 AM Pager: [phone] Phone: [phone]

## 2012-09-17 NOTE — Progress Notes (Signed)
1415 EKG converted to NSR with occasional pacs  First degree av block

## 2012-09-18 LAB — BASIC METABOLIC PANEL
BUN: 62 mg/dL — ABNORMAL HIGH (ref 6–23)
CO2: 26 mEq/L (ref 19–32)
Chloride: 87 mEq/L — ABNORMAL LOW (ref 96–112)
Glucose, Bld: 117 mg/dL — ABNORMAL HIGH (ref 70–99)
Potassium: 3.5 mEq/L (ref 3.5–5.1)

## 2012-09-18 LAB — CULTURE, RESPIRATORY W GRAM STAIN

## 2012-09-18 LAB — SODIUM, URINE, RANDOM: Sodium, Ur: 18 mEq/L

## 2012-09-18 MED ORDER — CEFEPIME HCL 1 G IJ SOLR
1.0000 g | INTRAMUSCULAR | Status: DC
  Administered 2012-09-18 – 2012-09-24 (×7): 1 g via INTRAVENOUS
  Filled 2012-09-18 (×8): qty 1

## 2012-09-18 NOTE — Progress Notes (Signed)
[facility] PROGRESS NOTE  Cheryl [name] IHK:[id number] DOB: Jun 03, 1925 DOA: 09/14/2012 PCP: Abigail Miyamoto, MD  Assessment/Plan: 79 y.o. female who presents with a history of productive cough for the past several weeks. She has been on ABx as an outpatient for suspected PNA. She finished a 10 day course of this as well as steroids several days ago but continues to have cough, occasional shortness of breath, and ongoing fevers. She presents to the ED today with fever of 101.2, WBC was 26.2k, and her CXR demonstrates a RML infiltrate c/w PNA. Her Creatinine of 2.09 and sodium of 124 appear to be baseline. Hospitalist has been asked to admit for HCAP (PNA felt to be HCAP since she had an admission in June for hip fracture) failed outpatient treatment. Found to have post obstructive pneumonia   1. HCAP/ post obstructive pneumonia, SIRS - CT chest R lung bronch obstruction midd/lowr lobe; awaiting  bronchoscopy per pulmonology, input appriciated; holding coumadin; heparin to be started per pharmacy  -was empirically on zosyn and vancomycin pharm to dose,c/s showed +MRSA and ENTEROBACTER AEROGENES; atx changed to cefepime+vanc based on sensitivity (8/30)  -patient will likely benefit from inpatient bronchoscopy -diet chang per speech therapy eval  2. [name] - HR controlled; off coumadin for procedure; pharm to dose heparin , cardiac monitor, continue home rate control meds -hold lasix due to low BP 3. Hyponatremic - chronic and baseline; likely lung related/SIADH;  4. ESRD - technically appears to be CKD stage 4 with GFR of about 20, this appears to be her baseline going back for several months. She does have a dialysis graft in her L arm which was put in Oct of last year, it has a strong pulsatile thrill, this graft has not been used for dialysis yet per patient. Continue home meds, monitor BMP while here but does not appear to be an acute issue at this time. -hold lasix due to low BP   5. HTN; 8/29 BP low BP; decrease amlodipine from 5-->2.5 (8/29); hold lasix (home dose 40 mg); cont BB due to a fib; monitor  6. Hypothyroidism; on levothyroxine; recheck tsh  Code Status: full  Family Communication: daughter at the bedside  (indicate person spoken with, relationship, and if by phone, the number) Disposition Plan: likely home    Consultants:  None   Procedures:  CT   Antibiotics:  Zosyn+vanc  (indicate start date, and stop date if known)  HPI/Subjective: Patient reports cough, no fever, no chest pain   Objective: Filed Vitals:   09/18/12 0533  BP: 118/60  Pulse: 68  Temp: 97.5 F (36.4 C)  Resp: 20    Intake/Output Summary (Last 24 hours) at 09/18/12 1025 Last data filed at 09/18/12 0947  Gross per 24 hour  Intake    880 ml  Output    601 ml  Net    279 ml   Filed Weights   09/16/12 0543 09/17/12 0602 09/18/12 0533  Weight: 52.4 kg (115 lb 8.3 oz) 51.3 kg (113 lb 1.5 oz) 52.6 kg (115 lb 15.4 oz)    Exam:   General:  Alert awake oriented; HOH  Cardiovascular: S1, S2  Respiratory: mild rhonchi   Abdomen: soft, ND, ND   Musculoskeletal: non tender     Data Reviewed: Basic Metabolic Panel:  Recent Labs Lab 09/14/12 2227 09/15/12 0540 09/16/12 0420 09/17/12 0550 09/18/12 0449  NA 124* 126* 126* 127* 127*  K 4.1 4.0 4.5 3.1* 3.5  CL 83* 84* 85* 86*  87*  CO2 29 30 30 24 26   GLUCOSE 106* 116* 92 109* 117*  BUN 64* 66* 61* 56* 62*  CREATININE 2.09* 2.16* 2.32* 2.61* 2.79*  CALCIUM 9.2 9.2 9.2 8.7 8.8   Liver Function Tests:  Recent Labs Lab 09/14/12 2227  AST 28  ALT 13  ALKPHOS 107  BILITOT 1.0  PROT 7.8  ALBUMIN 3.2*   No results found for this basename: LIPASE, AMYLASE,  in the last 168 hours No results found for this basename: AMMONIA,  in the last 168 hours CBC:  Recent Labs Lab 09/14/12 1421 09/14/12 2227 09/15/12 0540 09/16/12 0420 09/17/12 0550  WBC  --  26.2* 22.4* 22.1* 16.0*  NEUTROABS  --   22.8*  --   --   --   HGB 11.5* 12.0 10.8* 11.1* 10.7*  HCT  --  33.1* 31.3* 30.8* 30.2*  MCV  --  100.0 101.6* 100.0 100.3*  PLT  --  329 291 284 315   Cardiac Enzymes:  Recent Labs Lab 09/15/12 0027  TROPONINI <0.30   BNP (last 3 results) No results found for this basename: PROBNP,  in the last 8760 hours CBG: No results found for this basename: GLUCAP,  in the last 168 hours  Recent Results (from the past 240 hour(s))  CULTURE, BLOOD (ROUTINE X 2)     Status: None   Collection Time    09/15/12 12:20 AM      Result Value Range Status   Specimen Description BLOOD RIGHT ARM   Final   Special Requests     Final   Value: BOTTLES DRAWN AEROBIC AND ANAEROBIC 10CC BLUE 8CC RED   Culture  Setup Time     Final   Value: 09/15/2012 11:06     Performed at [facility]   Culture     Final   Value:        BLOOD CULTURE RECEIVED NO GROWTH TO DATE CULTURE WILL BE HELD FOR 5 DAYS BEFORE ISSUING A FINAL NEGATIVE REPORT     Performed at [facility]   Report Status PENDING   Incomplete  CULTURE, BLOOD (ROUTINE X 2)     Status: None   Collection Time    09/15/12 12:30 AM      Result Value Range Status   Specimen Description BLOOD RIGHT WRIST   Final   Special Requests BOTTLES DRAWN AEROBIC ONLY [facility]   Final   Culture  Setup Time     Final   Value: 09/15/2012 11:06     Performed at [facility]   Culture     Final   Value:        BLOOD CULTURE RECEIVED NO GROWTH TO DATE CULTURE WILL BE HELD FOR 5 DAYS BEFORE ISSUING A FINAL NEGATIVE REPORT     Performed at [facility]   Report Status PENDING   Incomplete  CULTURE, EXPECTORATED SPUTUM-ASSESSMENT     Status: None   Collection Time    09/15/12  9:11 AM      Result Value Range Status   Specimen Description SPUTUM   Final   Special Requests NONE   Final   Sputum evaluation     Final   Value: THIS SPECIMEN IS ACCEPTABLE. RESPIRATORY CULTURE REPORT TO FOLLOW.   Report Status 09/15/2012 FINAL   Final   CULTURE, RESPIRATORY (NON-EXPECTORATED)     Status: None   Collection Time    09/15/12  9:11 AM  Result Value Range Status   Specimen Description SPUTUM   Final   Special Requests NONE   Final   Gram Stain     Final   Value: ABUNDANT WBC PRESENT,BOTH PMN AND MONONUCLEAR     NO SQUAMOUS EPITHELIAL CELLS SEEN     MODERATE GRAM POSITIVE COCCI     IN PAIRS IN CLUSTERS RARE GRAM NEGATIVE RODS     RARE YEAST   Culture     Final   Value: ABUNDANT METHICILLIN RESISTANT STAPHYLOCOCCUS AUREUS     Note: RIFAMPIN AND GENTAMICIN SHOULD NOT BE USED AS SINGLE DRUGS FOR TREATMENT OF STAPH INFECTIONS. CRITICAL RESULT CALLED TO, READ BACK BY AND VERIFIED WITH: PEGGY GERHARD RN 10AM 09/18/12 GUSTK     ABUNDANT ENTEROBACTER AEROGENES     Performed at [facility]   Report Status 09/18/2012 FINAL   Final   Organism ID, Bacteria METHICILLIN RESISTANT STAPHYLOCOCCUS AUREUS   Final   Organism ID, Bacteria ENTEROBACTER AEROGENES   Final  GRAM STAIN     Status: None   Collection Time    09/15/12  9:12 AM      Result Value Range Status   Specimen Description SPUTUM   Final   Special Requests NONE   Final   Gram Stain     Final   Value: ABUNDANT WBC PRESENT,BOTH PMN AND MONONUCLEAR     MODERATE GRAM POSITIVE COCCI IN PAIRS IN CLUSTERS     RARE GRAM NEGATIVE RODS     RARE YEAST   Report Status 09/15/2012 FINAL   Final     Studies: Dg Swallowing Func-speech Pathology  09/17/2012   Gray Bernhardt, CCC-SLP     09/17/2012  3:04 PM Objective Swallowing Evaluation: Modified Barium Swallowing Study   Patient Details  Name: Cheryl [name] MRN: [id number] Date of Birth: [birth year]  Today's Date: 09/17/2012 Time: 1335-1400 SLP Time Calculation (min): 25 min  Past Medical History:  Past Medical History  Diagnosis Date  . HTN (hypertension)     2 YEARS  . Hypothyroidism   . Temporal arteritis   . Pulmonary embolism 1997  . Mitral regurgitation     a. mild by 01/2010 echo.  . Chronic diastolic CHF (congestive  heart failure)     a. 01/2010 Echo: EF 60%, mild LVH, High LV filling pressures, No  RWMA, mild MR, PASP .  Arman Bogus   . Chronic anemia   . Paroxysmal atrial fibrillation   . Pulmonary HTN     a. PASP by 01/2010 echo.  . S/P cardiac cath     a. 12/2009 R Heart Cath:  RA 12, RV 45/11, PA 47/21, PCWP MEAN  26, CO 4.56, CI 2.7.  . Ischemia     a. 12/2009 Myoview: Ant ischemia, EF 75%----NO CATH DONE DUE TO  CKD  . End stage renal disease     a. s/p AVF - not on dialysis.  [name] History of urinary frequency   . GERD (gastroesophageal reflux disease)   . Sleep trouble   . Constipation, chronic   . Hard of hearing   . Complication of anesthesia   . PONV (postoperative nausea and vomiting)   . Pneumonia 09/15/2012   Past Surgical History:  Past Surgical History  Procedure Laterality Date  . Temporal arteny bx    . Cataract surgery    . Eye surgery    . Abdominal hysterectomy    . Intramedullary (im) nail intertrochanteric Left 06/30/2012  Procedure: INTRAMEDULLARY (IM) NAIL INTERTROCHANTRIC LEFT;   Surgeon: Harvie Junior, MD;  Location: [facility] OR;  Service:  Orthopedics;  Laterality: Left;  . Injection knee Left 06/30/2012    Procedure: Left Knee Aspiration with Intra-Articular Injection;   Surgeon: Harvie Junior, MD;  Location: [facility] OR;  Service:  Orthopedics;  Laterality: Left;   HPI:  79 year old female admitted 09/14/12 with RML PNA, patchy airspace  disease of RLL. PMH significant for COPD, GERD, hip fx June 2014.  BSE ordered to assess swallow function and safety, and did not  reveal overt s/s aspiration.  Objective study was recommended due  to risk of silent aspiration.     Assessment / Plan / Recommendation Clinical Impression  Dysphagia Diagnosis: Mild pharyngeal phase dysphagia Clinical impression: Mild sensory and motor based pharyngeal  dysphagia, characterized by delayed swallow reflex, intermittent  trace penetration of thin liquids, and vallecular residue of  puree and cracker.  Oral phase appears to be  [facility].  Reflex trigger  was noted to occur at the vallecula on puree, cracker, and  intermittently on thin liquids.  Spillage to pyriform was also  seen intermitently on thin liquids.  Trace penetration was seen  during the swallow of thin liquids, and was removed with cued  throat clear.  Penetration is considered within normal limits for  this age group.  Will modify diet for energy conservation, and  provide safe swallow precautions (upright, remain upright 30  minutes after meals, clear throat occasionally).  Recommend meds  whole in puree, due to large bolus size needed to take meds with  thin liquid.  ST to follow briefly for diet tolerance and  education.    Treatment Recommendation  Therapy as outlined in treatment plan below    Diet Recommendation Dysphagia 3 (Mechanical Soft);Thin liquid  (chop meats)   Liquid Administration via: Cup;Straw Medication Administration: Whole meds with puree Supervision: Patient able to self feed Compensations: Slow rate;Small sips/bites;Clear throat  intermittently Postural Changes and/or Swallow Maneuvers: Seated upright 90  degrees;Upright 30-60 min after meal    Other  Recommendations Oral Care Recommendations: Oral care QID Other Recommendations: Clarify dietary restrictions   Follow Up Recommendations  24 hour supervision/assistance    Frequency and Duration min 1 x/week  1 week   Pertinent Vitals/Pain No pain reported   SLP Swallow Goals Patient will consume recommended diet without observed clinical  signs of aspiration with: Set-up Swallow Study Goal #1 - Progress: Progressing toward goal Patient will utilize recommended strategies during swallow to  increase swallowing safety with: Set-up Swallow Study Goal #2 - Progress: Progressing toward goal   General Date of Onset: 09/14/12 HPI: 79 year old female admitted 09/14/12 with RML PNA, patchy  airspace disease of RLL. PMH significant for COPD, GERD, hip fx  June 2014. BSE ordered to assess swallow function and safety, and   did not reveal overt s/s aspiration.  Objective study was  recommended due to risk of silent aspiration. Type of Study: Modified Barium Swallowing Study Reason for Referral: Objectively evaluate swallowing function Previous Swallow Assessment: BSE 09/17/12 Diet Prior to this Study: Regular;Thin liquids Temperature Spikes Noted: No Respiratory Status: Room air History of Recent Intubation: No Behavior/Cognition: Alert;Cooperative;Hard of hearing;Pleasant  mood Oral Cavity - Dentition: Dentures, bottom;Dentures, top Oral Motor / Sensory Function: Within functional limits Self-Feeding Abilities: Able to feed self Patient Positioning: Upright in chair Baseline Vocal Quality: Clear Volitional Cough: Strong Volitional Swallow: Able to elicit Anatomy: Within functional limits  Pharyngeal Secretions: Not observed secondary MBS    Reason for Referral Objectively evaluate swallowing function   Oral Phase Oral Preparation/Oral Phase Oral Phase: WFL   Pharyngeal Phase Pharyngeal Phase Pharyngeal Phase: Impaired Pharyngeal - Thin Pharyngeal - Thin Cup: Delayed swallow initiation;Premature  spillage to valleculae;Premature spillage to pyriform  sinuses;Penetration/Aspiration during swallow;Reduced  airway/laryngeal closure;Compensatory strategies attempted  (Comment) (small sip, throat clear) Penetration/Aspiration details (thin cup): Material enters  airway, passes BELOW cords then ejected out;Material does not  enter airway;Material enters airway, remains ABOVE vocal cords  and not ejected out;Material enters airway, remains ABOVE vocal  cords then ejected out Pharyngeal - Thin Straw: Premature spillage to  valleculae;Premature spillage to pyriform sinuses;Delayed swallow  initiation;Penetration/Aspiration during swallow;Reduced  airway/laryngeal closure;Compensatory strategies attempted  (Comment) (small sip, throat clear) Penetration/Aspiration details (thin straw): Material does not  enter airway;Material enters airway,  remains ABOVE vocal cords  then ejected out;Material enters airway, remains ABOVE vocal  cords and not ejected out Pharyngeal - Solids Pharyngeal - Puree: Premature spillage to valleculae;Delayed  swallow initiation;Reduced tongue base retraction;Pharyngeal  residue - valleculae;Compensatory strategies attempted (Comment)  (dry swallow) Pharyngeal - Multi-consistency: Delayed swallow  initiation;Premature spillage to valleculae;Reduced tongue base  retraction;Pharyngeal residue - valleculae (dry swallow)  Cervical Esophageal Phase    GO Celia B. Murvin [facility], CCC-SLP [phone]   Cervical Esophageal Phase Cervical Esophageal Phase: [facility]         Leigh Aurora 09/17/2012, 3:03 PM     Scheduled Meds: . amLODipine  2.5 mg Oral Daily  . calcitRIOL  0.25 mcg Oral Daily  . cholecalciferol  800 Units Oral Daily  . feeding supplement  237 mL Oral BID BM  . HYDROcodone-acetaminophen  1 tablet Oral q morning - 10a  . levothyroxine  75 mcg Oral Daily  . lubiprostone  24 mcg Oral BID WC  . metoprolol  100 mg Oral BID  . multivitamin with minerals  1 tablet Oral Daily  . oxybutynin  10 mg Oral Daily  . pantoprazole  40 mg Oral Daily  . piperacillin-tazobactam (ZOSYN)  IV  2.25 g Intravenous Q6H  . polyethylene glycol  17 g Oral Daily  . sertraline  25 mg Oral Daily  . vancomycin  750 mg Intravenous Q48H   Continuous Infusions:   Principal Problem:   HCAP (healthcare-associated pneumonia) Active Problems:   Atrial fibrillation   Hyponatremia   ESRD (end stage renal disease)    Time spent: > 25 minutes     Esperanza Sheets  [facility] Pager [phone]. If 7PM-7AM, please contact night-coverage at [email or web address], password [facility] 09/18/2012, 10:25 AM  LOS: 4 days

## 2012-09-18 NOTE — Progress Notes (Signed)
Report received that pt has MRSA in sputum culture. Placed on contact precautions. Md aware

## 2012-09-18 NOTE — Progress Notes (Signed)
ANTICOAGULATION and ANTIBIOTIC CONSULT NOTE - Follow Up Consult  Pharmacy Consult for heparin when INR >2; vancomycin, cefepime Indication: atrial fibrillation, hx PE; MRSA and Enterobacter pneumonia  Allergies  Allergen Reactions  . Codeine Other (See Comments)    unknown  . Sulfa Antibiotics     unknown    Patient Measurements: Height: 5\' 1"  (154.9 cm) Weight: 115 lb 15.4 oz (52.6 kg) IBW/kg (Calculated) : 47.8  Vital Signs: Temp: 97.5 F (36.4 C) (08/30 0533) Temp src: Oral (08/30 0533) BP: 118/60 mmHg (08/30 0533) Pulse Rate: 68 (08/30 0533) Intake/Output from previous day: 08/29 0701 - 08/30 0700 In: 940 [P.O.:840; IV Piggyback:100] Out: 450 [Urine:450] Intake/Output from this shift: Total I/O In: 180 [P.O.:180] Out: 252 [Urine:250; Stool:2]  Labs:  Recent Labs  09/16/12 0420 09/17/12 0550 09/18/12 0449  HGB 11.1* 10.7*  --   HCT 30.8* 30.2*  --   PLT 284 315  --   LABPROT 32.6* 32.0* 29.2*  INR 3.33* 3.25* 2.89*  CREATININE 2.32* 2.61* 2.79*    Estimated Creatinine Clearance: 11.1 ml/min (by C-G formula based on Cr of 2.79).  Microbiology: Recent Results (from the past 720 hour(s))  CULTURE, BLOOD (ROUTINE X 2)     Status: None   Collection Time    09/15/12 12:20 AM      Result Value Range Status   Specimen Description BLOOD RIGHT ARM   Final   Special Requests     Final   Value: BOTTLES DRAWN AEROBIC AND ANAEROBIC 10CC BLUE 8CC RED   Culture  Setup Time     Final   Value: 09/15/2012 11:06     Performed at [facility]   Culture     Final   Value:        BLOOD CULTURE RECEIVED NO GROWTH TO DATE CULTURE WILL BE HELD FOR 5 DAYS BEFORE ISSUING A FINAL NEGATIVE REPORT     Performed at [facility]   Report Status PENDING   Incomplete  CULTURE, BLOOD (ROUTINE X 2)     Status: None   Collection Time    09/15/12 12:30 AM      Result Value Range Status   Specimen Description BLOOD RIGHT WRIST   Final   Special Requests BOTTLES DRAWN  AEROBIC ONLY [facility]   Final   Culture  Setup Time     Final   Value: 09/15/2012 11:06     Performed at [facility]   Culture     Final   Value:        BLOOD CULTURE RECEIVED NO GROWTH TO DATE CULTURE WILL BE HELD FOR 5 DAYS BEFORE ISSUING A FINAL NEGATIVE REPORT     Performed at [facility]   Report Status PENDING   Incomplete  CULTURE, EXPECTORATED SPUTUM-ASSESSMENT     Status: None   Collection Time    09/15/12  9:11 AM      Result Value Range Status   Specimen Description SPUTUM   Final   Special Requests NONE   Final   Sputum evaluation     Final   Value: THIS SPECIMEN IS ACCEPTABLE. RESPIRATORY CULTURE REPORT TO FOLLOW.   Report Status 09/15/2012 FINAL   Final  CULTURE, RESPIRATORY (NON-EXPECTORATED)     Status: None   Collection Time    09/15/12  9:11 AM      Result Value Range Status   Specimen Description SPUTUM   Final   Special Requests NONE   Final  Gram Stain     Final   Value: ABUNDANT WBC PRESENT,BOTH PMN AND MONONUCLEAR     NO SQUAMOUS EPITHELIAL CELLS SEEN     MODERATE GRAM POSITIVE COCCI     IN PAIRS IN CLUSTERS RARE GRAM NEGATIVE RODS     RARE YEAST   Culture     Final   Value: ABUNDANT METHICILLIN RESISTANT STAPHYLOCOCCUS AUREUS     Note: RIFAMPIN AND GENTAMICIN SHOULD NOT BE USED AS SINGLE DRUGS FOR TREATMENT OF STAPH INFECTIONS. CRITICAL RESULT CALLED TO, READ BACK BY AND VERIFIED WITH: PEGGY GERHARD RN 10AM 09/18/12 GUSTK     ABUNDANT ENTEROBACTER AEROGENES     Performed at [facility]   Report Status 09/18/2012 FINAL   Final   Organism ID, Bacteria METHICILLIN RESISTANT STAPHYLOCOCCUS AUREUS   Final   Organism ID, Bacteria ENTEROBACTER AEROGENES   Final  GRAM STAIN     Status: None   Collection Time    09/15/12  9:12 AM      Result Value Range Status   Specimen Description SPUTUM   Final   Special Requests NONE   Final   Gram Stain     Final   Value: ABUNDANT WBC PRESENT,BOTH PMN AND MONONUCLEAR     MODERATE GRAM POSITIVE  COCCI IN PAIRS IN CLUSTERS     RARE GRAM NEGATIVE RODS     RARE YEAST   Report Status 09/15/2012 FINAL   Final    Anti-infectives   Start     Dose/Rate Route Frequency Ordered Stop   09/18/12 1400  ceFEPIme (MAXIPIME) 1 g in dextrose 5 % 50 mL IVPB     1 g 100 mL/hr over 30 Minutes Intravenous Every 24 hours 09/18/12 1222     09/17/12 0600  vancomycin (VANCOCIN) IVPB 750 mg/150 ml premix     750 mg 150 mL/hr over 60 Minutes Intravenous Every 48 hours 09/15/12 0156     09/15/12 0600  piperacillin-tazobactam (ZOSYN) IVPB 2.25 g  Status:  Discontinued     2.25 g 100 mL/hr over 30 Minutes Intravenous 4 times per day 09/15/12 0156 09/18/12 1038   09/15/12 0000  vancomycin (VANCOCIN) IVPB 1000 mg/200 mL premix     1,000 mg 200 mL/hr over 60 Minutes Intravenous  Once 09/14/12 2353 09/15/12 0243   09/15/12 0000  piperacillin-tazobactam (ZOSYN) IVPB 3.375 g     3.375 g 12.5 mL/hr over 240 Minutes Intravenous  Once 09/14/12 2353 09/15/12 0445      Assessment: 79 yo F admitted 09/14/2012 with PNA who failed 10 day outpatient course of antibiotics and steroids. She is also on chronic Coumadin for hx PE and Afib.  Sputum culture resulted back with MRSA and Enterobacter resistant to Zosyn. She continues on day 4 vancomycin and pharmacy consulted to change Zosyn to cefepime.  Vanc 8/27>> Zosyn 8/27>>8/30 cefpeime 8/30>>  8/27 sputum - MRSA (MIC 1), Enterobacter (R-ancef, cefoxitin, ceftaz, zosyn; S-cefepime, cipro, imipenem, AG, tmp-smz) 8/27 BCx2 - ngtd  Coumadin on hold for bronchoscopy next week. Pharmacy consulted to begin heparin when INR <2. INR remains therapeutic at 2.89 today.   Goal of Therapy:  Vancomycin trough 15-20 mcg/ml Eradication of infection INR 2-3 Heparin level 0.3-0.7 units/ml Monitor platelets by anticoagulation protocol: Yes   Plan:  -No heparin today -INR in am -F/u restart Coumadin -Continue vancomycin 750 mg IV q48h -Cefepime 1 g IV q24h -F/u renal fxn,  C&S, clinical status and trough at [facility] [place].D., BCPS Clinical Pharmacist  Pager: [phone] 09/18/2012 12:43 PM

## 2012-09-19 LAB — CBC
HCT: 29.9 % — ABNORMAL LOW (ref 36.0–46.0)
Hemoglobin: 10.6 g/dL — ABNORMAL LOW (ref 12.0–15.0)
MCH: 35.2 pg — ABNORMAL HIGH (ref 26.0–34.0)
MCV: 99.3 fL (ref 78.0–100.0)
RBC: 3.01 MIL/uL — ABNORMAL LOW (ref 3.87–5.11)
WBC: 13 10*3/uL — ABNORMAL HIGH (ref 4.0–10.5)

## 2012-09-19 LAB — BASIC METABOLIC PANEL
CO2: 28 mEq/L (ref 19–32)
Calcium: 9 mg/dL (ref 8.4–10.5)
Chloride: 88 mEq/L — ABNORMAL LOW (ref 96–112)
Creatinine, Ser: 2.56 mg/dL — ABNORMAL HIGH (ref 0.50–1.10)
Glucose, Bld: 114 mg/dL — ABNORMAL HIGH (ref 70–99)

## 2012-09-19 LAB — PROTIME-INR
INR: 2.53 — ABNORMAL HIGH (ref 0.00–1.49)
Prothrombin Time: 26.4 seconds — ABNORMAL HIGH (ref 11.6–15.2)

## 2012-09-19 MED ORDER — FUROSEMIDE 20 MG PO TABS
20.0000 mg | ORAL_TABLET | Freq: Every day | ORAL | Status: DC
  Administered 2012-09-20 – 2012-09-21 (×2): 20 mg via ORAL
  Filled 2012-09-19 (×2): qty 1

## 2012-09-19 NOTE — Progress Notes (Signed)
[facility] PROGRESS NOTE  Cheryl [name] WUJ:[id number] DOB: 01-31-1925 DOA: 09/14/2012 PCP: Abigail Miyamoto, MD  Assessment/Plan: 79 y.o. female who presents with a history of productive cough for the past several weeks. She has been on ABx as an outpatient for suspected PNA. She finished a 10 day course of this as well as steroids several days ago but continues to have cough, occasional shortness of breath, and ongoing fevers. She presents to the ED today with fever of 101.2, WBC was 26.2k, and her CXR demonstrates a RML infiltrate c/w PNA. Her Creatinine of 2.09 and sodium of 124 appear to be baseline. Hospitalist has been asked to admit for HCAP (PNA felt to be HCAP since she had an admission in June for hip fracture) failed outpatient treatment. Found to have post obstructive pneumonia   1. HCAP/ post obstructive pneumonia, SIRS - CT chest R lung bronch obstruction midd/lowr lobe; awaiting  bronchoscopy per pulmonology, input appriciated; holding coumadin; heparin to be started per pharmacy  -was empirically on zosyn and vancomycin pharm to dose,c/s showed +MRSA and ENTEROBACTER AEROGENES; atx changed to cefepime+vanc based on sensitivity (8/30)  -patient will likely benefit from inpatient bronchoscopy, likely on Tuesday; if -elevated INR may give FFP  -diet chang per speech therapy eval  2. [name] - HR controlled; off coumadin for procedure; pharm to dose heparin , cardiac monitor, continue home rate control meds 3. Hyponatremic - chronic and baseline; likely lung related/SIADH;  4. ESRD - technically appears to be CKD stage 4 with GFR of about 20, this appears to be her baseline going back for several months. She does have a dialysis graft in her L arm which was put in Oct of last year, it has a strong pulsatile thrill, this graft has not been used for dialysis yet per patient. Continue home meds, monitor BMP while here but does not appear to be an acute issue at this  time. -resume lasix on 9/1 at 20 mg; due to low BP; home dose 40 mg  5. HTN; 8/29 BP low BP; decrease amlodipine from 5-->2.5 (8/29); hold lasix (home dose 40 mg); cont BB due to a fib; monitor  6. Hypothyroidism; on levothyroxine; recheck tsh  Code Status: full  Family Communication: daughter at the bedside  (indicate person spoken with, relationship, and if by phone, the number) Disposition Plan: likely home    Consultants:  None   Procedures:  CT   Antibiotics:  Zosyn+vanc  (indicate start date, and stop date if known)  HPI/Subjective: Patient reports cough, no fever, no chest pain   Objective: Filed Vitals:   09/19/12 0555  BP: 134/63  Pulse: 81  Temp: 98.7 F (37.1 C)  Resp:     Intake/Output Summary (Last 24 hours) at 09/19/12 1204 Last data filed at 09/19/12 1119  Gross per 24 hour  Intake    680 ml  Output    900 ml  Net   -220 ml   Filed Weights   09/17/12 0602 09/18/12 0533 09/19/12 0552  Weight: 51.3 kg (113 lb 1.5 oz) 52.6 kg (115 lb 15.4 oz) 52.617 kg (116 lb)    Exam:   General:  Alert awake oriented; HOH  Cardiovascular: S1, S2  Respiratory: mild rhonchi   Abdomen: soft, ND, ND   Musculoskeletal: non tender     Data Reviewed: Basic Metabolic Panel:  Recent Labs Lab 09/15/12 0540 09/16/12 0420 09/17/12 0550 09/18/12 0449 09/19/12 0445  NA 126* 126* 127* 127* 127*  K 4.0  4.5 3.1* 3.5 3.9  CL 84* 85* 86* 87* 88*  CO2 30 30 24 26 28   GLUCOSE 116* 92 109* 117* 114*  BUN 66* 61* 56* 62* 57*  CREATININE 2.16* 2.32* 2.61* 2.79* 2.56*  CALCIUM 9.2 9.2 8.7 8.8 9.0   Liver Function Tests:  Recent Labs Lab 09/14/12 2227  AST 28  ALT 13  ALKPHOS 107  BILITOT 1.0  PROT 7.8  ALBUMIN 3.2*   No results found for this basename: LIPASE, AMYLASE,  in the last 168 hours No results found for this basename: AMMONIA,  in the last 168 hours CBC:  Recent Labs Lab 09/14/12 2227 09/15/12 0540 09/16/12 0420 09/17/12 0550  09/19/12 0445  WBC 26.2* 22.4* 22.1* 16.0* 13.0*  NEUTROABS 22.8*  --   --   --   --   HGB 12.0 10.8* 11.1* 10.7* 10.6*  HCT 33.1* 31.3* 30.8* 30.2* 29.9*  MCV 100.0 101.6* 100.0 100.3* 99.3  PLT 329 291 284 315 304   Cardiac Enzymes:  Recent Labs Lab 09/15/12 0027  TROPONINI <0.30   BNP (last 3 results) No results found for this basename: PROBNP,  in the last 8760 hours CBG: No results found for this basename: GLUCAP,  in the last 168 hours  Recent Results (from the past 240 hour(s))  CULTURE, BLOOD (ROUTINE X 2)     Status: None   Collection Time    09/15/12 12:20 AM      Result Value Range Status   Specimen Description BLOOD RIGHT ARM   Final   Special Requests     Final   Value: BOTTLES DRAWN AEROBIC AND ANAEROBIC 10CC BLUE 8CC RED   Culture  Setup Time     Final   Value: 09/15/2012 11:06     Performed at [facility]   Culture     Final   Value:        BLOOD CULTURE RECEIVED NO GROWTH TO DATE CULTURE WILL BE HELD FOR 5 DAYS BEFORE ISSUING A FINAL NEGATIVE REPORT     Performed at [facility]   Report Status PENDING   Incomplete  CULTURE, BLOOD (ROUTINE X 2)     Status: None   Collection Time    09/15/12 12:30 AM      Result Value Range Status   Specimen Description BLOOD RIGHT WRIST   Final   Special Requests BOTTLES DRAWN AEROBIC ONLY [facility]   Final   Culture  Setup Time     Final   Value: 09/15/2012 11:06     Performed at [facility]   Culture     Final   Value:        BLOOD CULTURE RECEIVED NO GROWTH TO DATE CULTURE WILL BE HELD FOR 5 DAYS BEFORE ISSUING A FINAL NEGATIVE REPORT     Performed at [facility]   Report Status PENDING   Incomplete  CULTURE, EXPECTORATED SPUTUM-ASSESSMENT     Status: None   Collection Time    09/15/12  9:11 AM      Result Value Range Status   Specimen Description SPUTUM   Final   Special Requests NONE   Final   Sputum evaluation     Final   Value: THIS SPECIMEN IS ACCEPTABLE. RESPIRATORY CULTURE  REPORT TO FOLLOW.   Report Status 09/15/2012 FINAL   Final  CULTURE, RESPIRATORY (NON-EXPECTORATED)     Status: None   Collection Time    09/15/12  9:11 AM  Result Value Range Status   Specimen Description SPUTUM   Final   Special Requests NONE   Final   Gram Stain     Final   Value: ABUNDANT WBC PRESENT,BOTH PMN AND MONONUCLEAR     NO SQUAMOUS EPITHELIAL CELLS SEEN     MODERATE GRAM POSITIVE COCCI     IN PAIRS IN CLUSTERS RARE GRAM NEGATIVE RODS     RARE YEAST   Culture     Final   Value: ABUNDANT METHICILLIN RESISTANT STAPHYLOCOCCUS AUREUS     Note: RIFAMPIN AND GENTAMICIN SHOULD NOT BE USED AS SINGLE DRUGS FOR TREATMENT OF STAPH INFECTIONS. CRITICAL RESULT CALLED TO, READ BACK BY AND VERIFIED WITH: PEGGY GERHARD RN 10AM 09/18/12 GUSTK     ABUNDANT ENTEROBACTER AEROGENES     Performed at [facility]   Report Status 09/18/2012 FINAL   Final   Organism ID, Bacteria METHICILLIN RESISTANT STAPHYLOCOCCUS AUREUS   Final   Organism ID, Bacteria ENTEROBACTER AEROGENES   Final  GRAM STAIN     Status: None   Collection Time    09/15/12  9:12 AM      Result Value Range Status   Specimen Description SPUTUM   Final   Special Requests NONE   Final   Gram Stain     Final   Value: ABUNDANT WBC PRESENT,BOTH PMN AND MONONUCLEAR     MODERATE GRAM POSITIVE COCCI IN PAIRS IN CLUSTERS     RARE GRAM NEGATIVE RODS     RARE YEAST   Report Status 09/15/2012 FINAL   Final     Studies: Dg Swallowing Func-speech Pathology  09/17/2012   Gray Bernhardt, CCC-SLP     09/17/2012  3:04 PM Objective Swallowing Evaluation: Modified Barium Swallowing Study   Patient Details  Name: KATHI DOHN MRN: [id number] Date of Birth: 1926-01-05  Today's Date: 09/17/2012 Time: 1335-1400 SLP Time Calculation (min): 25 min  Past Medical History:  Past Medical History  Diagnosis Date  . HTN (hypertension)     2 YEARS  . Hypothyroidism   . Temporal arteritis   . Pulmonary embolism 1997  . Mitral regurgitation     a.  mild by 01/2010 echo.  . Chronic diastolic CHF (congestive heart failure)     a. 01/2010 Echo: EF 60%, mild LVH, High LV filling pressures, No  RWMA, mild MR, PASP .  Arman Bogus   . Chronic anemia   . Paroxysmal atrial fibrillation   . Pulmonary HTN     a. PASP by 01/2010 echo.  . S/P cardiac cath     a. 12/2009 R Heart Cath:  RA 12, RV 45/11, PA 47/21, PCWP MEAN  26, CO 4.56, CI 2.7.  . Ischemia     a. 12/2009 Myoview: Ant ischemia, EF 75%----NO CATH DONE DUE TO  CKD  . End stage renal disease     a. s/p AVF - not on dialysis.  [name] History of urinary frequency   . GERD (gastroesophageal reflux disease)   . Sleep trouble   . Constipation, chronic   . Hard of hearing   . Complication of anesthesia   . PONV (postoperative nausea and vomiting)   . Pneumonia 09/15/2012   Past Surgical History:  Past Surgical History  Procedure Laterality Date  . Temporal arteny bx    . Cataract surgery    . Eye surgery    . Abdominal hysterectomy    . Intramedullary (im) nail intertrochanteric Left 06/30/2012  Procedure: INTRAMEDULLARY (IM) NAIL INTERTROCHANTRIC LEFT;   Surgeon: Harvie Junior, MD;  Location: [facility] OR;  Service:  Orthopedics;  Laterality: Left;  . Injection knee Left 06/30/2012    Procedure: Left Knee Aspiration with Intra-Articular Injection;   Surgeon: Harvie Junior, MD;  Location: [facility] OR;  Service:  Orthopedics;  Laterality: Left;   HPI:  79 year old female admitted 09/14/12 with RML PNA, patchy airspace  disease of RLL. PMH significant for COPD, GERD, hip fx June 2014.  BSE ordered to assess swallow function and safety, and did not  reveal overt s/s aspiration.  Objective study was recommended due  to risk of silent aspiration.     Assessment / Plan / Recommendation Clinical Impression  Dysphagia Diagnosis: Mild pharyngeal phase dysphagia Clinical impression: Mild sensory and motor based pharyngeal  dysphagia, characterized by delayed swallow reflex, intermittent  trace penetration of thin liquids, and vallecular  residue of  puree and cracker.  Oral phase appears to be [facility].  Reflex trigger  was noted to occur at the vallecula on puree, cracker, and  intermittently on thin liquids.  Spillage to pyriform was also  seen intermitently on thin liquids.  Trace penetration was seen  during the swallow of thin liquids, and was removed with cued  throat clear.  Penetration is considered within normal limits for  this age group.  Will modify diet for energy conservation, and  provide safe swallow precautions (upright, remain upright 30  minutes after meals, clear throat occasionally).  Recommend meds  whole in puree, due to large bolus size needed to take meds with  thin liquid.  ST to follow briefly for diet tolerance and  education.    Treatment Recommendation  Therapy as outlined in treatment plan below    Diet Recommendation Dysphagia 3 (Mechanical Soft);Thin liquid  (chop meats)   Liquid Administration via: Cup;Straw Medication Administration: Whole meds with puree Supervision: Patient able to self feed Compensations: Slow rate;Small sips/bites;Clear throat  intermittently Postural Changes and/or Swallow Maneuvers: Seated upright 90  degrees;Upright 30-60 min after meal    Other  Recommendations Oral Care Recommendations: Oral care QID Other Recommendations: Clarify dietary restrictions   Follow Up Recommendations  24 hour supervision/assistance    Frequency and Duration min 1 x/week  1 week   Pertinent Vitals/Pain No pain reported   SLP Swallow Goals Patient will consume recommended diet without observed clinical  signs of aspiration with: Set-up Swallow Study Goal #1 - Progress: Progressing toward goal Patient will utilize recommended strategies during swallow to  increase swallowing safety with: Set-up Swallow Study Goal #2 - Progress: Progressing toward goal   General Date of Onset: 09/14/12 HPI: 79 year old female admitted 09/14/12 with RML PNA, patchy  airspace disease of RLL. PMH significant for COPD, GERD, hip fx  June  2014. BSE ordered to assess swallow function and safety, and  did not reveal overt s/s aspiration.  Objective study was  recommended due to risk of silent aspiration. Type of Study: Modified Barium Swallowing Study Reason for Referral: Objectively evaluate swallowing function Previous Swallow Assessment: BSE 09/17/12 Diet Prior to this Study: Regular;Thin liquids Temperature Spikes Noted: No Respiratory Status: Room air History of Recent Intubation: No Behavior/Cognition: Alert;Cooperative;Hard of hearing;Pleasant  mood Oral Cavity - Dentition: Dentures, bottom;Dentures, top Oral Motor / Sensory Function: Within functional limits Self-Feeding Abilities: Able to feed self Patient Positioning: Upright in chair Baseline Vocal Quality: Clear Volitional Cough: Strong Volitional Swallow: Able to elicit Anatomy: Within functional limits  Pharyngeal Secretions: Not observed secondary MBS    Reason for Referral Objectively evaluate swallowing function   Oral Phase Oral Preparation/Oral Phase Oral Phase: WFL   Pharyngeal Phase Pharyngeal Phase Pharyngeal Phase: Impaired Pharyngeal - Thin Pharyngeal - Thin Cup: Delayed swallow initiation;Premature  spillage to valleculae;Premature spillage to pyriform  sinuses;Penetration/Aspiration during swallow;Reduced  airway/laryngeal closure;Compensatory strategies attempted  (Comment) (small sip, throat clear) Penetration/Aspiration details (thin cup): Material enters  airway, passes BELOW cords then ejected out;Material does not  enter airway;Material enters airway, remains ABOVE vocal cords  and not ejected out;Material enters airway, remains ABOVE vocal  cords then ejected out Pharyngeal - Thin Straw: Premature spillage to  valleculae;Premature spillage to pyriform sinuses;Delayed swallow  initiation;Penetration/Aspiration during swallow;Reduced  airway/laryngeal closure;Compensatory strategies attempted  (Comment) (small sip, throat clear) Penetration/Aspiration details (thin straw):  Material does not  enter airway;Material enters airway, remains ABOVE vocal cords  then ejected out;Material enters airway, remains ABOVE vocal  cords and not ejected out Pharyngeal - Solids Pharyngeal - Puree: Premature spillage to valleculae;Delayed  swallow initiation;Reduced tongue base retraction;Pharyngeal  residue - valleculae;Compensatory strategies attempted (Comment)  (dry swallow) Pharyngeal - Multi-consistency: Delayed swallow  initiation;Premature spillage to valleculae;Reduced tongue base  retraction;Pharyngeal residue - valleculae (dry swallow)  Cervical Esophageal Phase    GO Celia B. Murvin Natal [facility], CCC-SLP [phone]   Cervical Esophageal Phase Cervical Esophageal Phase: [facility]         Leigh Aurora 09/17/2012, 3:03 PM     Scheduled Meds: . amLODipine  2.5 mg Oral Daily  . calcitRIOL  0.25 mcg Oral Daily  . ceFEPime (MAXIPIME) IV  1 g Intravenous Q24H  . cholecalciferol  800 Units Oral Daily  . feeding supplement  237 mL Oral BID BM  . HYDROcodone-acetaminophen  1 tablet Oral q morning - 10a  . levothyroxine  75 mcg Oral Daily  . lubiprostone  24 mcg Oral BID WC  . metoprolol  100 mg Oral BID  . multivitamin with minerals  1 tablet Oral Daily  . oxybutynin  10 mg Oral Daily  . pantoprazole  40 mg Oral Daily  . polyethylene glycol  17 g Oral Daily  . sertraline  25 mg Oral Daily  . vancomycin  750 mg Intravenous Q48H   Continuous Infusions:   Principal Problem:   HCAP (healthcare-associated pneumonia) Active Problems:   Atrial fibrillation   Hyponatremia   ESRD (end stage renal disease)    Time spent: > 25 minutes     Esperanza Sheets  [facility] Pager [phone]. If 7PM-7AM, please contact night-coverage at [email or web address], password [facility] 09/19/2012, 12:04 PM  LOS: 5 days

## 2012-09-19 NOTE — Progress Notes (Signed)
ANTICOAGULATION CONSULT NOTE - Follow Up Consult  Pharmacy Consult for heparin when INR >2 Indication: atrial fibrillation, hx PE  Allergies  Allergen Reactions  . Codeine Other (See Comments)    unknown  . Sulfa Antibiotics     unknown    Patient Measurements: Height: 5\' 1"  (154.9 cm) Weight: 116 lb (52.617 kg) IBW/kg (Calculated) : 47.8  Vital Signs: Temp: 98.7 F (37.1 C) (08/31 0555) Temp src: Oral (08/31 0555) BP: 134/63 mmHg (08/31 0555) Pulse Rate: 81 (08/31 0555)  Labs:  Recent Labs  09/17/12 0550 09/18/12 0449 09/19/12 0445  HGB 10.7*  --  10.6*  HCT 30.2*  --  29.9*  PLT 315  --  304  LABPROT 32.0* 29.2* 26.4*  INR 3.25* 2.89* 2.53*  CREATININE 2.61* 2.79* 2.56*    Estimated Creatinine Clearance: 12.1 ml/min (by C-G formula based on Cr of 2.56).  Assessment: 79 y/o on chronic Coumadin for hx PE and Afib. Coumadin on hold for bronchoscopy next week. Pharmacy consulted to begin heparin when INR <2. INR remains therapeutic at 2.53.  Goal of Therapy:  INR 2-3 Heparin level 0.3-0.7 units/ml Monitor platelets by anticoagulation protocol: Yes   Plan:  -No heparin today  -INR in am  -F/u restart Coumadin  [facility], [place].D., BCPS Clinical Pharmacist Pager: [phone] 09/19/2012 12:41 PM

## 2012-09-20 DIAGNOSIS — D649 Anemia, unspecified: Secondary | ICD-10-CM

## 2012-09-20 DIAGNOSIS — D689 Coagulation defect, unspecified: Secondary | ICD-10-CM

## 2012-09-20 DIAGNOSIS — I509 Heart failure, unspecified: Secondary | ICD-10-CM

## 2012-09-20 DIAGNOSIS — R5381 Other malaise: Secondary | ICD-10-CM

## 2012-09-20 DIAGNOSIS — I080 Rheumatic disorders of both mitral and aortic valves: Secondary | ICD-10-CM

## 2012-09-20 DIAGNOSIS — I5032 Chronic diastolic (congestive) heart failure: Secondary | ICD-10-CM

## 2012-09-20 LAB — PROTIME-INR: Prothrombin Time: 21.4 seconds — ABNORMAL HIGH (ref 11.6–15.2)

## 2012-09-20 MED ORDER — HEPARIN (PORCINE) IN NACL 100-0.45 UNIT/ML-% IJ SOLN
850.0000 [IU]/h | INTRAMUSCULAR | Status: DC
  Administered 2012-09-20: 650 [IU]/h via INTRAVENOUS
  Filled 2012-09-20 (×2): qty 250

## 2012-09-20 NOTE — Progress Notes (Signed)
ANTICOAGULATION & ANTIBIOTIC CONSULT NOTE - Follow Up Consult  Pharmacy Consult for Heparin; Vancomycin and Cefepime Indication: atrial fibrillation, hx PE; MRSA and Enterobacter pneumonia  Allergies  Allergen Reactions  . Codeine Other (See Comments)    unknown  . Sulfa Antibiotics     unknown    Patient Measurements: Height: 5\' 1"  (154.9 cm) Weight: 116 lb 6.5 oz (52.8 kg) IBW/kg (Calculated) : 47.8 Heparin Dosing Weight: 52.8 kg  Vital Signs: Temp: 98.3 F (36.8 C) (09/01 0549) Temp src: Oral (09/01 0549) BP: 132/59 mmHg (09/01 0549) Pulse Rate: 72 (09/01 0549)  Labs:  Recent Labs  09/18/12 0449 09/19/12 0445 09/20/12 0405  HGB  --  10.6*  --   HCT  --  29.9*  --   PLT  --  304  --   LABPROT 29.2* 26.4* 21.4*  INR 2.89* 2.53* 1.92*  CREATININE 2.79* 2.56*  --     Estimated Creatinine Clearance: 12.1 ml/min (by C-G formula based on Cr of 2.56).  Assessment:   Coumadin on hold since 8/27 for bronchoscopy, possibly on 9/2. Steady decrease in INR over the last 4 days, now down to 1.92.  Heparin to begin today.   HCAP/post-obstructive pneumonia; MRSA and Enterobacter in sputum culture 8/27.   Day # 6 Vancomycin, day # 3 Cefepime (after 3 days of Zoysn).   CKD, stage 4.  Goal of Therapy:  Heparin level 0.3-0.7 units/ml Monitor platelets by anticoagulation protocol: Yes Vancomycin troughs 15-20 mcg/ml   Plan:   Begin heparin drip at 650 units/hr (~12 units/kg/hr)  Heparin level ~8 hrs after drip begins.  Daily heparin level, PT/INR and CBC.  Follow-up restart Coumadin post-bronch.  Continue Vancomycin 750 mg IV q48hrs.  Will check trough level and bmet prior to 6am dose on 9/2.  Continue Cefepime 1 gram IV q24hrs.   Dennie Fetters, [place] Pager: [phone] 09/20/2012,8:31 AM

## 2012-09-20 NOTE — Progress Notes (Signed)
PULMONARY  / CRITICAL CARE MEDICINE  Name: Cheryl [name] MRN: [id number] DOB: 01/20/1926    ADMISSION DATE:  09/14/2012 CONSULTATION DATE:  09/15/2012  REFERRING MD :  York Spaniel [facility] PRIMARY SERVICE: [facility]  CHIEF COMPLAINT:  Non-resolving pneumonia  BRIEF PATIENT DESCRIPTION: 79 y/o female was admitted on 8/26 to the [facility] service at [facility] for pneumonia unresponsive to antibiotics as an outpatient; a CT chest was concerning for an endobronchial lesion causing post obstructive pneumonia.  SIGNIFICANT EVENTS / STUDIES:  8/27 CT chest >> complete occlusion of the RML bronchus with dense RML and RLL consolidation and interlobular septal thickening; enlarge paratracheal lymph nodes   LINES / TUBES:   CULTURES: 8/27 sputum >> MRSA & Enterobacter Aerogenes (sens Maxipeme) 8/27 blood >> no growth  ANTIBIOTICS: 8/26 vanc >> 8/26 zosyn >> changed to Maxipeme  HISTORY OF PRESENT ILLNESS:   79 y/o female was admitted on 8/26 to the [facility] service at [facility] for pneumonia unresponsive to antibiotics as an outpatient; a CT chest was concerning for an endobronchial lesion causing post obstructive pneumonia. She cannot recall the details of her illness on my visit.  She stated that she had been experiencing a strong cough for several days but did not recall whether or not she has been treated with antibiotics or the duration of the illness.  Chart review notes that she has been treated with antibiotics as an outpatient and was admitted for persistent fevers.  She underwent a hip replacement in June.  PAST MEDICAL HISTORY :  Past Medical History  Diagnosis Date  . HTN (hypertension)     2 YEARS  . Hypothyroidism   . Temporal arteritis   . Pulmonary embolism 1997  . Mitral regurgitation     a. mild by 01/2010 echo.  . Chronic diastolic CHF (congestive heart failure)     a. 01/2010 Echo: EF 60%, mild LVH, High LV filling pressures, No RWMA, mild MR, PASP .  Arman Bogus   . Chronic anemia   .  Paroxysmal atrial fibrillation   . Pulmonary HTN     a. PASP by 01/2010 echo.  . S/P cardiac cath     a. 12/2009 R Heart Cath:  RA 12, RV 45/11, PA 47/21, PCWP MEAN 26, CO 4.56, CI 2.7.  . Ischemia     a. 12/2009 Myoview: Ant ischemia, EF 75%----NO CATH DONE DUE TO CKD  . End stage renal disease     a. s/p AVF - not on dialysis.  [name] History of urinary frequency   . GERD (gastroesophageal reflux disease)   . Sleep trouble   . Constipation, chronic   . Hard of hearing   . Complication of anesthesia   . PONV (postoperative nausea and vomiting)   . Pneumonia 09/15/2012   Past Surgical History  Procedure Laterality Date  . Temporal arteny bx    . Cataract surgery    . Eye surgery    . Abdominal hysterectomy    . Intramedullary (im) nail intertrochanteric Left 06/30/2012    Procedure: INTRAMEDULLARY (IM) NAIL INTERTROCHANTRIC LEFT;  Surgeon: Harvie Junior, MD;  Location: [facility] OR;  Service: Orthopedics;  Laterality: Left;  . Injection knee Left 06/30/2012    Procedure: Left Knee Aspiration with Intra-Articular Injection;  Surgeon: Harvie Junior, MD;  Location: [facility] OR;  Service: Orthopedics;  Laterality: Left;    . amLODipine  2.5 mg Oral Daily  . calcitRIOL  0.25 mcg Oral Daily  . ceFEPime (MAXIPIME) IV  1  g Intravenous Q24H  . cholecalciferol  800 Units Oral Daily  . feeding supplement  237 mL Oral BID BM  . furosemide  20 mg Oral Daily  . HYDROcodone-acetaminophen  1 tablet Oral q morning - 10a  . levothyroxine  75 mcg Oral Daily  . lubiprostone  24 mcg Oral BID WC  . metoprolol  100 mg Oral BID  . multivitamin with minerals  1 tablet Oral Daily  . oxybutynin  10 mg Oral Daily  . pantoprazole  40 mg Oral Daily  . polyethylene glycol  17 g Oral Daily  . sertraline  25 mg Oral Daily  . vancomycin  750 mg Intravenous Q48H     SUBJECTIVE:   VITAL SIGNS: Temp:  [98.3 F (36.8 C)-99.2 F (37.3 C)] 98.3 F (36.8 C) (09/01 0549) Pulse Rate:  [72-84] 72 (09/01 0549) Resp:   [18-20] 18 (09/01 0549) BP: (114-132)/(59-63) 132/59 mmHg (09/01 0549) SpO2:  [94 %-98 %] 94 % (09/01 0549) Weight:  [52.8 kg (116 lb 6.5 oz)] 52.8 kg (116 lb 6.5 oz) (09/01 0549) HEMODYNAMICS:   VENTILATOR SETTINGS:   INTAKE / OUTPUT: Intake/Output     08/31 0701 - 09/01 0700 09/01 0701 - 09/02 0700   P.O. 720    Total Intake(mL/kg) 720 (13.6)    Urine (mL/kg/hr) 1250 (1) 100 (1.1)   Stool     Total Output 1250 100   Net -530 -100          PHYSICAL EXAMINATION: Gen: elderly female, no acute distress HEENT: NCAT, PERRL, EOMi, OP clear, neck supple without masses PULM: poor air movement RLL, crackles RLL, clear on left CV: Irreg irreg, no mgr, no JVD AB: BS+, soft, nontender, no hsm Ext: warm, no edema, no clubbing, no cyanosis Derm: no rash or skin breakdown Neuro: Awake and alert, oriented to place, situation, but cannot give detailed answers, follows commands, maew   LABS:  CBC Recent Labs     09/19/12  0445  WBC  13.0*  HGB  10.6*  HCT  29.9*  PLT  304   Coag's Recent Labs     09/18/12  0449  09/19/12  0445  09/20/12  0405  INR  2.89*  2.53*  1.92*   BMET Recent Labs     09/18/12  0449  09/19/12  0445  NA  127*  127*  K  3.5  3.9  CL  87*  88*  CO2  26  28  BUN  62*  57*  CREATININE  2.79*  2.56*  GLUCOSE  117*  114*   Electrolytes Recent Labs     09/18/12  0449  09/19/12  0445  CALCIUM  8.8  9.0   Sepsis Markers No results found for this basename: LACTICACIDVEN, PROCALCITON, O2SATVEN,  in the last 72 hours ABG No results found for this basename: PHART, PCO2ART, PO2ART,  in the last 72 hours Liver Enzymes No results found for this basename: AST, ALT, ALKPHOS, BILITOT, ALBUMIN,  in the last 72 hours Cardiac Enzymes No results found for this basename: TROPONINI, PROBNP,  in the last 72 hours Glucose No results found for this basename: GLUCAP,  in the last 72 hours   CXR: RLL, RML airspace disease CTChest w/ occlusion RML  bronchus   ASSESSMENT / PLAN:  PULMONARY A: HCAP unresponsive to outpatient therapy; CT chest worrisome for RML obstruction, ddx includes mucus plugging vs malignancy  Mild to moderate pulmonary hypertension by [facility] in 2011; WHO class 2 from  diastolic CHF   P:   -agree with current antibiotics, see ID -needs bronchoscopy, but would not perform transbronchial biopsies given PH -cannot perform biopsies until INR normalizes -will place heparin consult to be started when INR < 2.0 -no indication for pulmonary vasodilators in WHO class 2 pulmonary hypertension 9/1> PT=21.4/ INR=1.9 off coumadin x several days; will make NPO in AM in case bronch can proceed  CARDIOVASCULAR A: diastolic CHF, currently well compensated Afib on warfarin P:  -continue metop, lasix, amlodipine per TRH  RENAL A:  CKD at baseline Hyponatremia P:   -per TRH   INFECTIOUS A:  HCAP P:   -agree with vanc/maxipeme for now -f/u cultures  NEUROLOGIC A:  Slight forgetfullness/confusion on 8/27 during my visit; early dementia? Fatigue? Drug effect P:   -monitor neuro status -minimize sedating meds   Ocia Simek M,MD Pulmonary and Critical Care Medicine [facility] 09/20/2012, 8:40 AM

## 2012-09-20 NOTE — Progress Notes (Signed)
[facility] PROGRESS NOTE  Cheryl [name] ZOX:[id number] DOB: 05/29/1925 DOA: 09/14/2012 PCP: Abigail Miyamoto, MD  Assessment/Plan: 79 y.o. female who presents with a history of productive cough for the past several weeks. She has been on ABx as an outpatient for suspected PNA. She finished a 10 day course of this as well as steroids several days ago but continues to have cough, occasional shortness of breath, and ongoing fevers. She presents to the ED today with fever of 101.2, WBC was 26.2k, and her CXR demonstrates a RML infiltrate c/w PNA. Her Creatinine of 2.09 and sodium of 124 appear to be baseline. Hospitalist has been asked to admit for HCAP (PNA felt to be HCAP since she had an admission in June for hip fracture) failed outpatient treatment. Found to have post obstructive pneumonia   1. HCAP/ post obstructive pneumonia, SIRS - CT chest R lung bronch obstruction midd/lowr lobe; awaiting  bronchoscopy per pulmonology, input appriciated; holding coumadin; heparin to be started per pharmacy  -was empirically on zosyn and vancomycin (8/27) pharm to dose, but c/s showed +MRSA and ENTEROBACTER AEROGENES; atx changed to cefepime+vanc based on sensitivity (8/30)  -patient will likely benefit from inpatient bronchoscopy, likely on Tuesday; if -elevated INR may give FFP  -CT: There is complete occlusion of the right middle lobe bronchus and partial occlusion of the right lower lobe bronchus; likely need broch -d/w Dr. Sung Amabile who kindly agreed to follow up today;  -diet chang per speech therapy eval  2. [name] - HR controlled; off coumadin for procedure; pharm to dose heparin , cardiac monitor, continue home rate control meds 3. Hyponatremic - chronic and baseline; likely lung related/SIADH;  4. ESRD - technically appears to be CKD stage 4 with GFR of about 20, this appears to be her baseline going back for several months. She does have a dialysis graft in her L arm which was put in Oct  of last year, it has a strong pulsatile thrill, this graft has not been used for dialysis yet per patient. Continue home meds, monitor BMP while here but does not appear to be an acute issue at this time. -resume lasix on 9/1 at 20 mg; due to low BP; home dose 40 mg  5. HTN; 8/29 BP was on low; decrease amlodipine from 5-->2.5 (8/29); lasix was on hold (home dose 40 mg); resume 9/1; cont BB; monitor  6. Hypothyroidism; on levothyroxine; recheck tsh  Code Status: full  Family Communication: daughter at the bedside  (indicate person spoken with, relationship, and if by phone, the number) Disposition Plan: likely home    Consultants:  None   Procedures:  CT   Antibiotics:  Zosyn+vanc  (indicate start date, and stop date if known)  HPI/Subjective: Patient reports cough, no fever, no chest pain   Objective: Filed Vitals:   09/20/12 0549  BP: 132/59  Pulse: 72  Temp: 98.3 F (36.8 C)  Resp: 18    Intake/Output Summary (Last 24 hours) at 09/20/12 0915 Last data filed at 09/20/12 0900  Gross per 24 hour  Intake    840 ml  Output   1050 ml  Net   -210 ml   Filed Weights   09/18/12 0533 09/19/12 0552 09/20/12 0549  Weight: 52.6 kg (115 lb 15.4 oz) 52.617 kg (116 lb) 52.8 kg (116 lb 6.5 oz)    Exam:   General:  Alert awake oriented; HOH  Cardiovascular: S1, S2  Respiratory: mild rhonchi   Abdomen: soft, ND, ND  Musculoskeletal: non tender     Data Reviewed: Basic Metabolic Panel:  Recent Labs Lab 09/15/12 0540 09/16/12 0420 09/17/12 0550 09/18/12 0449 09/19/12 0445  NA 126* 126* 127* 127* 127*  K 4.0 4.5 3.1* 3.5 3.9  CL 84* 85* 86* 87* 88*  CO2 30 30 24 26 28   GLUCOSE 116* 92 109* 117* 114*  BUN 66* 61* 56* 62* 57*  CREATININE 2.16* 2.32* 2.61* 2.79* 2.56*  CALCIUM 9.2 9.2 8.7 8.8 9.0   Liver Function Tests:  Recent Labs Lab 09/14/12 2227  AST 28  ALT 13  ALKPHOS 107  BILITOT 1.0  PROT 7.8  ALBUMIN 3.2*   No results found for this  basename: LIPASE, AMYLASE,  in the last 168 hours No results found for this basename: AMMONIA,  in the last 168 hours CBC:  Recent Labs Lab 09/14/12 2227 09/15/12 0540 09/16/12 0420 09/17/12 0550 09/19/12 0445  WBC 26.2* 22.4* 22.1* 16.0* 13.0*  NEUTROABS 22.8*  --   --   --   --   HGB 12.0 10.8* 11.1* 10.7* 10.6*  HCT 33.1* 31.3* 30.8* 30.2* 29.9*  MCV 100.0 101.6* 100.0 100.3* 99.3  PLT 329 291 284 315 304   Cardiac Enzymes:  Recent Labs Lab 09/15/12 0027  TROPONINI <0.30   BNP (last 3 results) No results found for this basename: PROBNP,  in the last 8760 hours CBG: No results found for this basename: GLUCAP,  in the last 168 hours  Recent Results (from the past 240 hour(s))  CULTURE, BLOOD (ROUTINE X 2)     Status: None   Collection Time    09/15/12 12:20 AM      Result Value Range Status   Specimen Description BLOOD RIGHT ARM   Final   Special Requests     Final   Value: BOTTLES DRAWN AEROBIC AND ANAEROBIC 10CC BLUE 8CC RED   Culture  Setup Time     Final   Value: 09/15/2012 11:06     Performed at [facility]   Culture     Final   Value:        BLOOD CULTURE RECEIVED NO GROWTH TO DATE CULTURE WILL BE HELD FOR 5 DAYS BEFORE ISSUING A FINAL NEGATIVE REPORT     Performed at [facility]   Report Status PENDING   Incomplete  CULTURE, BLOOD (ROUTINE X 2)     Status: None   Collection Time    09/15/12 12:30 AM      Result Value Range Status   Specimen Description BLOOD RIGHT WRIST   Final   Special Requests BOTTLES DRAWN AEROBIC ONLY [facility]   Final   Culture  Setup Time     Final   Value: 09/15/2012 11:06     Performed at [facility]   Culture     Final   Value:        BLOOD CULTURE RECEIVED NO GROWTH TO DATE CULTURE WILL BE HELD FOR 5 DAYS BEFORE ISSUING A FINAL NEGATIVE REPORT     Performed at [facility]   Report Status PENDING   Incomplete  CULTURE, EXPECTORATED SPUTUM-ASSESSMENT     Status: None   Collection Time     09/15/12  9:11 AM      Result Value Range Status   Specimen Description SPUTUM   Final   Special Requests NONE   Final   Sputum evaluation     Final   Value: THIS SPECIMEN IS ACCEPTABLE. RESPIRATORY CULTURE  REPORT TO FOLLOW.   Report Status 09/15/2012 FINAL   Final  CULTURE, RESPIRATORY (NON-EXPECTORATED)     Status: None   Collection Time    09/15/12  9:11 AM      Result Value Range Status   Specimen Description SPUTUM   Final   Special Requests NONE   Final   Gram Stain     Final   Value: ABUNDANT WBC PRESENT,BOTH PMN AND MONONUCLEAR     NO SQUAMOUS EPITHELIAL CELLS SEEN     MODERATE GRAM POSITIVE COCCI     IN PAIRS IN CLUSTERS RARE GRAM NEGATIVE RODS     RARE YEAST   Culture     Final   Value: ABUNDANT METHICILLIN RESISTANT STAPHYLOCOCCUS AUREUS     Note: RIFAMPIN AND GENTAMICIN SHOULD NOT BE USED AS SINGLE DRUGS FOR TREATMENT OF STAPH INFECTIONS. CRITICAL RESULT CALLED TO, READ BACK BY AND VERIFIED WITH: PEGGY GERHARD RN 10AM 09/18/12 GUSTK     ABUNDANT ENTEROBACTER AEROGENES     Performed at [facility]   Report Status 09/18/2012 FINAL   Final   Organism ID, Bacteria METHICILLIN RESISTANT STAPHYLOCOCCUS AUREUS   Final   Organism ID, Bacteria ENTEROBACTER AEROGENES   Final  GRAM STAIN     Status: None   Collection Time    09/15/12  9:12 AM      Result Value Range Status   Specimen Description SPUTUM   Final   Special Requests NONE   Final   Gram Stain     Final   Value: ABUNDANT WBC PRESENT,BOTH PMN AND MONONUCLEAR     MODERATE GRAM POSITIVE COCCI IN PAIRS IN CLUSTERS     RARE GRAM NEGATIVE RODS     RARE YEAST   Report Status 09/15/2012 FINAL   Final     Studies: No results found.  Scheduled Meds: . amLODipine  2.5 mg Oral Daily  . calcitRIOL  0.25 mcg Oral Daily  . ceFEPime (MAXIPIME) IV  1 g Intravenous Q24H  . cholecalciferol  800 Units Oral Daily  . feeding supplement  237 mL Oral BID BM  . furosemide  20 mg Oral Daily  . HYDROcodone-acetaminophen  1  tablet Oral q morning - 10a  . levothyroxine  75 mcg Oral Daily  . lubiprostone  24 mcg Oral BID WC  . metoprolol  100 mg Oral BID  . multivitamin with minerals  1 tablet Oral Daily  . oxybutynin  10 mg Oral Daily  . pantoprazole  40 mg Oral Daily  . polyethylene glycol  17 g Oral Daily  . sertraline  25 mg Oral Daily  . vancomycin  750 mg Intravenous Q48H   Continuous Infusions: . heparin      Principal Problem:   HCAP (healthcare-associated pneumonia) Active Problems:   Atrial fibrillation   Hyponatremia   ESRD (end stage renal disease)    Time spent: > 25 minutes     Esperanza Sheets  [facility] Pager [phone]. If 7PM-7AM, please contact night-coverage at [email or web address], password [facility] 09/20/2012, 9:15 AM  LOS: 6 days

## 2012-09-20 NOTE — Progress Notes (Signed)
ANTICOAGULATION CONSULT NOTE - Follow Up Consult  Pharmacy Consult for heparin Indication: atrial fibrillation  Allergies  Allergen Reactions  . Codeine Other (See Comments)    unknown  . Sulfa Antibiotics     unknown    Patient Measurements: Height: 5\' 1"  (154.9 cm) Weight: 116 lb 6.5 oz (52.8 kg) IBW/kg (Calculated) : 47.8 Heparin Dosing Weight: 52.8 kg  Vital Signs: Temp: 98.1 F (36.7 C) (09/01 1928) Temp src: Oral (09/01 1928) BP: 148/57 mmHg (09/01 1928) Pulse Rate: 76 (09/01 1928)  Labs:  Recent Labs  09/18/12 0449 09/19/12 0445 09/20/12 0405 09/20/12 1759  HGB  --  10.6*  --   --   HCT  --  29.9*  --   --   PLT  --  304  --   --   LABPROT 29.2* 26.4* 21.4*  --   INR 2.89* 2.53* 1.92*  --   HEPARINUNFRC  --   --   --  <0.10*  CREATININE 2.79* 2.56*  --   --     Estimated Creatinine Clearance: 12.1 ml/min (by C-G formula based on Cr of 2.56).   Medications:  Infusions:  . heparin 650 Units/hr (09/20/12 1347)    Assessment: 79 yo female on a heparin drip for Afib while Coumadin on hold for bronchoscopy. Heparin level is subtherapeutic at <0.1 on 650 units/hr. Of note, heparin was off for ~45 min around ~13:00 due to IV access issue. No bleeding noted.  Goal of Therapy:  Heparin level 0.3-0.7 units/ml Monitor platelets by anticoagulation protocol: Yes   Plan:  - Increase heparin to 850 units/hr - Daily heparin level and CBC - Monitor for signs/symptoms of bleeding  [facility], [address].D., BCPS Clinical Pharmacist Pager: [phone] 09/20/2012 7:30 PM

## 2012-09-21 ENCOUNTER — Encounter (HOSPITAL_COMMUNITY): Payer: Medicare Other

## 2012-09-21 ENCOUNTER — Inpatient Hospital Stay (HOSPITAL_COMMUNITY): Payer: Medicare Other

## 2012-09-21 ENCOUNTER — Encounter (HOSPITAL_COMMUNITY)
Admission: EM | Disposition: A | Discharge status: Home Health | Payer: Self-pay | Source: Home / Self Care | Attending: Internal Medicine

## 2012-09-21 DIAGNOSIS — J309 Allergic rhinitis, unspecified: Secondary | ICD-10-CM

## 2012-09-21 DIAGNOSIS — R911 Solitary pulmonary nodule: Secondary | ICD-10-CM | POA: Diagnosis present

## 2012-09-21 HISTORY — PX: VIDEO BRONCHOSCOPY: SHX5072

## 2012-09-21 LAB — BASIC METABOLIC PANEL
CO2: 27 mEq/L (ref 19–32)
Calcium: 8.9 mg/dL (ref 8.4–10.5)
Chloride: 90 mEq/L — ABNORMAL LOW (ref 96–112)
Creatinine, Ser: 2.13 mg/dL — ABNORMAL HIGH (ref 0.50–1.10)
GFR calc Af Amer: 23 mL/min — ABNORMAL LOW (ref >90)
Sodium: 127 mEq/L — ABNORMAL LOW (ref 135–145)

## 2012-09-21 LAB — HEPARIN LEVEL (UNFRACTIONATED)
Heparin Unfractionated: 0.31 IU/mL (ref 0.30–0.70)
Heparin Unfractionated: 0.46 IU/mL (ref 0.30–0.70)

## 2012-09-21 LAB — PROTIME-INR
INR: 1.63 — ABNORMAL HIGH (ref 0.00–1.49)
Prothrombin Time: 18.9 seconds — ABNORMAL HIGH (ref 11.6–15.2)

## 2012-09-21 LAB — VANCOMYCIN, TROUGH: Vancomycin Tr: 15.7 ug/mL (ref 10.0–20.0)

## 2012-09-21 LAB — CBC
Hemoglobin: 9.8 g/dL — ABNORMAL LOW (ref 12.0–15.0)
MCH: 35 pg — ABNORMAL HIGH (ref 26.0–34.0)
MCHC: 35.3 g/dL (ref 30.0–36.0)
Platelets: 293 10*3/uL (ref 150–400)
RBC: 2.8 MIL/uL — ABNORMAL LOW (ref 3.87–5.11)

## 2012-09-21 LAB — CULTURE, BLOOD (ROUTINE X 2): Culture: NO GROWTH

## 2012-09-21 SURGERY — VIDEO BRONCHOSCOPY WITHOUT FLUORO
Anesthesia: Moderate Sedation | Laterality: Bilateral

## 2012-09-21 MED ORDER — FUROSEMIDE 40 MG PO TABS
40.0000 mg | ORAL_TABLET | Freq: Every day | ORAL | Status: DC
  Administered 2012-09-22 – 2012-09-25 (×4): 40 mg via ORAL
  Filled 2012-09-21 (×4): qty 1

## 2012-09-21 MED ORDER — FENTANYL CITRATE 0.05 MG/ML IJ SOLN
INTRAMUSCULAR | Status: DC | PRN
  Administered 2012-09-21: 50 ug via INTRAVENOUS
  Administered 2012-09-21: 25 ug via INTRAVENOUS

## 2012-09-21 MED ORDER — VANCOMYCIN HCL IN DEXTROSE 1-5 GM/200ML-% IV SOLN
1000.0000 mg | INTRAVENOUS | Status: DC
  Administered 2012-09-23 – 2012-09-25 (×2): 1000 mg via INTRAVENOUS
  Filled 2012-09-21 (×2): qty 200

## 2012-09-21 MED ORDER — LIDOCAINE HCL (PF) 1 % IJ SOLN
INTRAMUSCULAR | Status: DC | PRN
  Administered 2012-09-21: 6 mL

## 2012-09-21 MED ORDER — LIDOCAINE HCL 2 % EX GEL
CUTANEOUS | Status: DC | PRN
  Administered 2012-09-21: 1

## 2012-09-21 MED ORDER — MIDAZOLAM HCL 10 MG/2ML IJ SOLN
INTRAMUSCULAR | Status: DC | PRN
  Administered 2012-09-21: 16:00:00 2 mg via INTRAVENOUS

## 2012-09-21 MED ORDER — LIDOCAINE HCL 2 % EX GEL
Freq: Once | CUTANEOUS | Status: DC
  Filled 2012-09-21: qty 5

## 2012-09-21 MED ORDER — ENSURE COMPLETE PO LIQD
237.0000 mL | ORAL | Status: DC
  Administered 2012-09-22 – 2012-09-25 (×4): 237 mL via ORAL

## 2012-09-21 MED ORDER — HEPARIN (PORCINE) IN NACL 100-0.45 UNIT/ML-% IJ SOLN
800.0000 [IU]/h | INTRAMUSCULAR | Status: DC
  Administered 2012-09-21 – 2012-09-23 (×2): 850 [IU]/h via INTRAVENOUS
  Administered 2012-09-23: 800 [IU]/h via INTRAVENOUS
  Filled 2012-09-21 (×4): qty 250

## 2012-09-21 MED ORDER — PHENYLEPHRINE HCL 0.25 % NA SOLN
NASAL | Status: DC | PRN
  Administered 2012-09-21: 2 via NASAL

## 2012-09-21 MED ORDER — AMLODIPINE BESYLATE 5 MG PO TABS
5.0000 mg | ORAL_TABLET | Freq: Every day | ORAL | Status: DC
  Administered 2012-09-22 – 2012-09-25 (×4): 5 mg via ORAL
  Filled 2012-09-21 (×4): qty 1

## 2012-09-21 MED ORDER — PHENYLEPHRINE HCL 0.25 % NA SOLN
1.0000 | Freq: Four times a day (QID) | NASAL | Status: DC | PRN
  Filled 2012-09-21: qty 15

## 2012-09-21 NOTE — Progress Notes (Signed)
Came to visit patient on behalf of [facility] Care Management services. However, currently off the unit. Will come back at later time.  Raiford Noble, MSN-Ed, RN, BSN- [facility] [phone]

## 2012-09-21 NOTE — Progress Notes (Signed)
NUTRITION FOLLOW-UP  DOCUMENTATION CODES Per approved criteria  -Severe malnutrition in the context of chronic illness   INTERVENTION: Decrease Ensure Complete to daily, each supplement provides 350 kcal and 13 grams of protein. RD to continue to follow nutrition care plan.  NUTRITION DIAGNOSIS: Inadequate oral intake related to acute illness as evidenced by pt report and ongoing weight loss. Improving.  Goal: Intake to meet >90% of estimated nutrition needs. Met.  Monitor:  weight trends, lab trends, I/O's, PO intake, supplement tolerance  ASSESSMENT: PMHx significant for HTN, PE, ESRD non on HD. Admitted with cough x weeks, SOB and fevers. Work-up reveals PNA.  CCM evaluated pt and plan for bronchoscopy. Plan for FOB this afternoon. Remains NPO for procedure at this time.  MBSS completed 8/29 with recommendations for Dysphagia 3 diet with thin liquids. Intake of diet over the weekend was adequate, pt was consuming mostly >75% of meals.  Pt meets criteria for severe MALNUTRITION in the context of chronic illness as evidenced by severe muscle mass loss, <75% of estimated energy intake x at least 1 month, and 13% wt loss x 1 month.  Height: Ht Readings from Last 1 Encounters:  09/15/12 5\' 1"  (1.549 m)    Weight: Wt Readings from Last 1 Encounters:  09/21/12 117 lb 11.2 oz (53.388 kg)  Admit wt 118 lb - stable  BMI:  Body mass index is 22.25 kg/(m^2). Weight is WNL.  Estimated Nutritional Needs: Kcal: 1250 - 1500 Protein: 40 - 50 g Fluid: 1.3 - 1.5 liters daily  Skin: intact  Diet Order: Dysphagia 3; thin liquids   Intake/Output Summary (Last 24 hours) at 09/21/12 1045 Last data filed at 09/21/12 0700  Gross per 24 hour  Intake 805.58 ml  Output   1550 ml  Net -744.42 ml    Last BM: 9/1  Labs:   Recent Labs Lab 09/18/12 0449 09/19/12 0445 09/21/12 0420  NA 127* 127* 127*  K 3.5 3.9 4.1  CL 87* 88* 90*  CO2 26 28 27   BUN 62* 57* 48*  CREATININE  2.79* 2.56* 2.13*  CALCIUM 8.8 9.0 8.9  GLUCOSE 117* 114* 92    CBG (last 3)  No results found for this basename: GLUCAP,  in the last 72 hours  Scheduled Meds: . amLODipine  2.5 mg Oral Daily  . calcitRIOL  0.25 mcg Oral Daily  . ceFEPime (MAXIPIME) IV  1 g Intravenous Q24H  . cholecalciferol  800 Units Oral Daily  . feeding supplement  237 mL Oral BID BM  . furosemide  20 mg Oral Daily  . HYDROcodone-acetaminophen  1 tablet Oral q morning - 10a  . levothyroxine  75 mcg Oral Daily  . lubiprostone  24 mcg Oral BID WC  . metoprolol  100 mg Oral BID  . multivitamin with minerals  1 tablet Oral Daily  . oxybutynin  10 mg Oral Daily  . pantoprazole  40 mg Oral Daily  . polyethylene glycol  17 g Oral Daily  . sertraline  25 mg Oral Daily  . [START ON 09/23/2012] vancomycin  1,000 mg Intravenous Q48H    Continuous Infusions: . heparin 850 Units/hr (09/20/12 1947)   Jarold Motto MS, RD, LDN Pager: [phone] After-hours pager: [phone]

## 2012-09-21 NOTE — Interval H&P Note (Signed)
Pt presents for FOB. No barriers to proceeding. Heparin was turned off at 12:00.   Levy Pupa, MD, PhD 09/21/2012, 3:30 PM [place] [facility] [phone] or if no answer [phone]

## 2012-09-21 NOTE — Op Note (Signed)
Video Bronchoscopy Procedure Note  Date of Operation: 09/21/2012  Pre-op Diagnosis: RML obstruction  Post-op Diagnosis: Same  Surgeon: Levy Pupa  Assistants: none  Anesthesia: conscious sedation, moderate sedation  Meds Given: fentanyl , versed 2mg  in divided doses, epi 1:10000 dil 10cc total, 1% lidocaine 20cc total  Operation: Flexible video fiberoptic bronchoscopy and biopsies.  Estimated Blood Loss: 20cc  Complications: none noted  Indications and History: Cheryl [name] is 79 y.o. with history of non-resolving RML PNA. CT chest showed a proximal RML obstruction.  Recommendation was to perform video fiberoptic bronchoscopy with biopsies. The risks, benefits, complications, treatment options and expected outcomes were discussed with the patient.  The possibilities of pneumothorax, pneumonia, reaction to medication, pulmonary aspiration, perforation of a viscus, bleeding, failure to diagnose a condition and creating a complication requiring transfusion or operation were discussed with the patient who freely signed the consent.    Description of Procedure: The patient was seen in the Preoperative Area, was examined and was deemed appropriate to proceed.  The patient was taken to [facility], identified as Cheryl [name] and the procedure verified as Flexible Video Fiberoptic Bronchoscopy.  A Time Out was held and the above information confirmed.   Conscious sedation was initiated as indicated above. The video fiberoptic bronchoscope was introduced via the R nare and a general inspection was performed which showed normal cords, normal trachea, normal main carina. The R sided airways were inspected and showed normal RUL. The BI was somewhat narrowed. The orifice to the RML was fish-mouthed and difficult to pass with the bronchoscope. The RLL had a small amount of narrowing but all distal airways could be examined and appeared normal. The L side was then inspected. The LLL,  Lingular and LUL airways were normal. The RML airway was lavaged with saline to get better visualization. There was no obvious endobronchial lesion within visual range, but even gentle suctioning caused oozing hemorrhage, suggestive of a friable endobronchial lesion.   1:10000 epi was injected onto the lesion. There was some initial moderate bleeding that continued through the case and made visualization challenging. Endobronchial brushings, endobronchial forceps biopsies were performed with difficulty in the RML airway just past the orifice when resistance was met. Finally endobronchial washings were performed in the RML to be sent for cytology. The patient tolerated the procedure well. The bronchoscope was removed. There were no obvious complications.   Samples: 1. Endobronchial brushings from RML 2. Endobronchial forceps biopsies from RML 3. Bronchial washings from RML  Plans:  We will review the cytology, pathology and results with the patient when they become available.   Levy Pupa, MD, PhD 09/21/2012, 4:00 PM [place] [facility] [phone] or if no answer [phone]

## 2012-09-21 NOTE — Progress Notes (Signed)
Video bronchoscopy procedure performed. Bronchial washing intervention performed. Brochial brushing intervention performed. Bronchial biopsy procedure performed.  Levy Pupa, MD, PhD 09/21/2012, 4:50 PM [place] [facility] [phone] or if no answer [phone]

## 2012-09-21 NOTE — Progress Notes (Addendum)
[facility] PROGRESS NOTE  AMARISA WILINSKI UXL:[id number] DOB: [birth year] DOA: 09/14/2012 PCP: Abigail Miyamoto, MD  Brief narrative  79 y.o. female who presents with a history of productive cough for the past several weeks. She has been on ABx as an outpatient for suspected PNA a 10 day course of this as well as steroids several days ago but continues to have cough, occasional shortness of breath, and ongoing fevers.  -She presents to the ED today with fever of 101.2, WBC was 26.2k, and her CXR demonstrates a RML infiltrate c/w PNA. Her Creatinine of 2.09 and sodium of 124 appear to be baseline. Hospitalist has been asked to admit for HCAP (PNA felt to be HCAP since she had an admission in June for hip fracture) failed outpatient treatment. Found to have post obstructive pneumonia   Assessment/Plan: 1. HCAP/ post obstructive pneumonia, SIRS; CT chest R lung bronch obstruction midd/lowr lobe;        -s/p  bronchoscopy broch and Bx RML broch; holding coumadin; on IV heparin per pharmacy   -Patient was empirically on zosyn and vancomycin (8/27) pharm to dose, but c/s showed +MRSA and ENTEROBACTER AEROGENES; atx changed to cefepime+vanc based on sensitivity (8/30)   -diet chang per speech therapy eval; atx to be changed to PO upon d/c   2. [name] - HR controlled; off coumadin for procedure; pharm to dose heparin, cardiac monitor, continue home rate control meds  3. Hyponatremic - chronic and baseline; likely lung related/SIADH;   4. ESRD - technically appears to be CKD stage 4 with GFR of about 20, this appears to be her baseline going back for several months. She does have a dialysis graft in her L arm which was put in Oct of last year, it has a strong pulsatile thrill, this graft has not been used for dialysis yet per patient. Continue home meds, monitor BMP while here but does not appear to be an acute issue at this time. -resumed lasix on 9/1 at 40 mg;  Was on hold for Low BP   5.  HTN; 8/29 BP was on low; decrease amlodipine from 5-->2.5 (8/29); lasix was on hold (home dose 40 mg); resume 9/1; cont BB; monitor   6. Hypothyroidism; on levothyroxine; recheck tsh  Disposition: monitor post broch Bx for bleeding on anticoagulation    Code Status: full  Family Communication: daughter at the bedside  (indicate person spoken with, relationship, and if by phone, the number) Disposition Plan: likely home    Consultants:  Dr. Delton Coombes  Procedures:  CT,   Bronchoscopy   Antibiotics:  Zosyn 8/27--> 8/30      vanc  8/27-->    HPI/Subjective: Patient reports cough, no fever, no chest pain   Objective: Filed Vitals:   09/21/12 1603  BP: 154/64  Pulse:   Temp:   Resp: 17    Intake/Output Summary (Last 24 hours) at 09/21/12 1614 Last data filed at 09/21/12 1421  Gross per 24 hour  Intake 525.66 ml  Output   2250 ml  Net -1724.34 ml   Filed Weights   09/19/12 0552 09/20/12 0549 09/21/12 0547  Weight: 116 lb (52.617 kg) 116 lb 6.5 oz (52.8 kg) 117 lb 11.2 oz (53.388 kg)    Exam:   General:  Alert awake oriented; HOH  Cardiovascular: S1, S2  Respiratory: mild rhonchi   Abdomen: soft, ND, ND   Musculoskeletal: non tender     Data Reviewed: Basic Metabolic Panel:  Recent Labs Lab 09/16/12 0420  09/17/12 0550 09/18/12 0449 09/19/12 0445 09/21/12 0420  NA 126* 127* 127* 127* 127*  K 4.5 3.1* 3.5 3.9 4.1  CL 85* 86* 87* 88* 90*  CO2 30 24 26 28 27   GLUCOSE 92 109* 117* 114* 92  BUN 61* 56* 62* 57* 48*  CREATININE 2.32* 2.61* 2.79* 2.56* 2.13*  CALCIUM 9.2 8.7 8.8 9.0 8.9   Liver Function Tests:  Recent Labs Lab 09/14/12 2227  AST 28  ALT 13  ALKPHOS 107  BILITOT 1.0  PROT 7.8  ALBUMIN 3.2*   No results found for this basename: LIPASE, AMYLASE,  in the last 168 hours No results found for this basename: AMMONIA,  in the last 168 hours CBC:  Recent Labs Lab 09/14/12 2227 09/15/12 0540 09/16/12 0420 09/17/12 0550  09/19/12 0445 09/21/12 0420  WBC 26.2* 22.4* 22.1* 16.0* 13.0* 10.5  NEUTROABS 22.8*  --   --   --   --   --   HGB 12.0 10.8* 11.1* 10.7* 10.6* 9.8*  HCT 33.1* 31.3* 30.8* 30.2* 29.9* 27.8*  MCV 100.0 101.6* 100.0 100.3* 99.3 99.3  PLT 329 291 284 315 304 293   Cardiac Enzymes:  Recent Labs Lab 09/15/12 0027  TROPONINI <0.30   BNP (last 3 results) No results found for this basename: PROBNP,  in the last 8760 hours CBG: No results found for this basename: GLUCAP,  in the last 168 hours  Recent Results (from the past 240 hour(s))  CULTURE, BLOOD (ROUTINE X 2)     Status: None   Collection Time    09/15/12 12:20 AM      Result Value Range Status   Specimen Description BLOOD RIGHT ARM   Final   Special Requests     Final   Value: BOTTLES DRAWN AEROBIC AND ANAEROBIC 10CC BLUE 8CC RED   Culture  Setup Time     Final   Value: 09/15/2012 11:06     Performed at [facility]   Culture     Final   Value: NO GROWTH 5 DAYS     Performed at [facility]   Report Status 09/21/2012 FINAL   Final  CULTURE, BLOOD (ROUTINE X 2)     Status: None   Collection Time    09/15/12 12:30 AM      Result Value Range Status   Specimen Description BLOOD RIGHT WRIST   Final   Special Requests BOTTLES DRAWN AEROBIC ONLY [facility]   Final   Culture  Setup Time     Final   Value: 09/15/2012 11:06     Performed at [facility]   Culture     Final   Value: NO GROWTH 5 DAYS     Performed at [facility]   Report Status 09/21/2012 FINAL   Final  CULTURE, EXPECTORATED SPUTUM-ASSESSMENT     Status: None   Collection Time    09/15/12  9:11 AM      Result Value Range Status   Specimen Description SPUTUM   Final   Special Requests NONE   Final   Sputum evaluation     Final   Value: THIS SPECIMEN IS ACCEPTABLE. RESPIRATORY CULTURE REPORT TO FOLLOW.   Report Status 09/15/2012 FINAL   Final  CULTURE, RESPIRATORY (NON-EXPECTORATED)     Status: None   Collection Time    09/15/12   9:11 AM      Result Value Range Status   Specimen Description SPUTUM   Final  Special Requests NONE   Final   Gram Stain     Final   Value: ABUNDANT WBC PRESENT,BOTH PMN AND MONONUCLEAR     NO SQUAMOUS EPITHELIAL CELLS SEEN     MODERATE GRAM POSITIVE COCCI     IN PAIRS IN CLUSTERS RARE GRAM NEGATIVE RODS     RARE YEAST   Culture     Final   Value: ABUNDANT METHICILLIN RESISTANT STAPHYLOCOCCUS AUREUS     Note: RIFAMPIN AND GENTAMICIN SHOULD NOT BE USED AS SINGLE DRUGS FOR TREATMENT OF STAPH INFECTIONS. CRITICAL RESULT CALLED TO, READ BACK BY AND VERIFIED WITH: PEGGY GERHARD RN 10AM 09/18/12 GUSTK     ABUNDANT ENTEROBACTER AEROGENES     Performed at [facility]   Report Status 09/18/2012 FINAL   Final   Organism ID, Bacteria METHICILLIN RESISTANT STAPHYLOCOCCUS AUREUS   Final   Organism ID, Bacteria ENTEROBACTER AEROGENES   Final  GRAM STAIN     Status: None   Collection Time    09/15/12  9:12 AM      Result Value Range Status   Specimen Description SPUTUM   Final   Special Requests NONE   Final   Gram Stain     Final   Value: ABUNDANT WBC PRESENT,BOTH PMN AND MONONUCLEAR     MODERATE GRAM POSITIVE COCCI IN PAIRS IN CLUSTERS     RARE GRAM NEGATIVE RODS     RARE YEAST   Report Status 09/15/2012 FINAL   Final     Studies: Dg Chest Port 1 View  09/21/2012   *RADIOLOGY REPORT*  Clinical Data: Cough and congestion.  PORTABLE CHEST - 1 VIEW  Comparison: PA and lateral chest 09/14/2012 and CT chest 09/15/2012.  Findings: Right middle and lower lobe airspace disease has worsened.  Streaky opacity is identified in the left lung base. There is cardiomegaly.  No pneumothorax identified.  There is likely a small right pleural effusion.  IMPRESSION: Worsened right middle and lower lobe airspace disease.  New streaky opacity is also seen in the left lung base which could be atelectasis or pneumonia.   Original Report Authenticated By: Holley Dexter, M.D.    Scheduled Meds: .  amLODipine  2.5 mg Oral Daily  . calcitRIOL  0.25 mcg Oral Daily  . ceFEPime (MAXIPIME) IV  1 g Intravenous Q24H  . cholecalciferol  800 Units Oral Daily  . [START ON 09/22/2012] feeding supplement  237 mL Oral Q24H  . furosemide  20 mg Oral Daily  . HYDROcodone-acetaminophen  1 tablet Oral q morning - 10a  . levothyroxine  75 mcg Oral Daily  . lidocaine   Topical Once  . lubiprostone  24 mcg Oral BID WC  . metoprolol  100 mg Oral BID  . multivitamin with minerals  1 tablet Oral Daily  . oxybutynin  10 mg Oral Daily  . pantoprazole  40 mg Oral Daily  . polyethylene glycol  17 g Oral Daily  . sertraline  25 mg Oral Daily  . [START ON 09/23/2012] vancomycin  1,000 mg Intravenous Q48H   Continuous Infusions:    Principal Problem:   HCAP (healthcare-associated pneumonia) Active Problems:   Atrial fibrillation   Hyponatremia   ESRD (end stage renal disease)    Time spent: > 25 minutes     Esperanza Sheets  [facility] Pager [phone]. If 7PM-7AM, please contact night-coverage at [email or web address], password [facility] 09/21/2012, 4:14 PM  LOS: 7 days

## 2012-09-21 NOTE — Progress Notes (Signed)
PULMONARY  / CRITICAL CARE MEDICINE  Name: Cheryl [name] MRN: [id number] DOB: 09/24/1925    ADMISSION DATE:  09/14/2012 CONSULTATION DATE:  09/15/2012  REFERRING MD :  York Spaniel [facility] PRIMARY SERVICE: [facility]  CHIEF COMPLAINT:  Non-resolving pneumonia  BRIEF PATIENT DESCRIPTION: 79 y/o female was admitted on 8/26 to the [facility] service at [facility] for pneumonia unresponsive to antibiotics as an outpatient; a CT chest was concerning for an endobronchial lesion causing post obstructive pneumonia.  SIGNIFICANT EVENTS / STUDIES:  8/27 CT chest >> complete occlusion of the RML bronchus with dense RML and RLL consolidation and interlobular septal thickening; enlarge paratracheal lymph nodes   LINES / TUBES:   CULTURES: 8/27 sputum >> MRSA & Enterobacter Aerogenes (sens Maxipeme) 8/27 blood >> no growth  HISTORY OF PRESENT ILLNESS:   79 y/o female was admitted on 8/26 to the [facility] service at [facility] for pneumonia unresponsive to antibiotics as an outpatient; a CT chest was concerning for an endobronchial lesion causing post obstructive pneumonia. She cannot recall the details of her illness on my visit.  She stated that she had been experiencing a strong cough for several days but did not recall whether or not she has been treated with antibiotics or the duration of the illness.  Chart review notes that she has been treated with antibiotics as an outpatient and was admitted for persistent fevers.  She underwent a hip replacement in June.  SUBJECTIVE:  Feels better, still w cough Off coumadin, on heparin  VITAL SIGNS: Temp:  [98.1 F (36.7 C)-98.8 F (37.1 C)] 98.5 F (36.9 C) (09/02 0547) Pulse Rate:  [72-76] 72 (09/02 1002) Resp:  [18-20] 19 (09/02 0547) BP: (107-148)/(56-64) 140/58 mmHg (09/02 1002) SpO2:  [94 %-95 %] 95 % (09/02 0547) Weight:  [53.388 kg (117 lb 11.2 oz)] 53.388 kg (117 lb 11.2 oz) (09/02 0547) HEMODYNAMICS:   VENTILATOR SETTINGS:   INTAKE / OUTPUT: Intake/Output     09/01 0701  - 09/02 0700 09/02 0701 - 09/03 0700   P.O. 1200    I.V. (mL/kg) 55.6 (1)    IV Piggyback 150    Total Intake(mL/kg) 1405.6 (26.3)    Urine (mL/kg/hr) 1750 (1.4)    Total Output 1750     Net -344.4          Stool Occurrence 1 x      PHYSICAL EXAMINATION: Gen: elderly female, no acute distress HEENT: NCAT, PERRL, EOMi, OP clear, neck supple without masses PULM: poor air movement RLL, crackles RLL, clear on left CV: Irreg irreg, no mgr, no JVD AB: BS+, soft, nontender, no hsm Ext: warm, no edema, no clubbing, no cyanosis Derm: no rash or skin breakdown Neuro: Awake and alert, oriented to place, situation, but cannot give detailed answers, follows commands, maew   LABS:  CBC Recent Labs     09/19/12  0445  09/21/12  0420  WBC  13.0*  10.5  HGB  10.6*  9.8*  HCT  29.9*  27.8*  PLT  304  293   Coag's Recent Labs     09/19/12  0445  09/20/12  0405  09/21/12  0420  INR  2.53*  1.92*  1.63*   BMET Recent Labs     09/19/12  0445  09/21/12  0420  NA  127*  127*  K  3.9  4.1  CL  88*  90*  CO2  28  27  BUN  57*  48*  CREATININE  2.56*  2.13*  GLUCOSE  114*  92   Electrolytes Recent Labs     09/19/12  0445  09/21/12  0420  CALCIUM  9.0  8.9   Sepsis Markers No results found for this basename: LACTICACIDVEN, PROCALCITON, O2SATVEN,  in the last 72 hours ABG No results found for this basename: PHART, PCO2ART, PO2ART,  in the last 72 hours Liver Enzymes No results found for this basename: AST, ALT, ALKPHOS, BILITOT, ALBUMIN,  in the last 72 hours Cardiac Enzymes No results found for this basename: TROPONINI, PROBNP,  in the last 72 hours Glucose No results found for this basename: GLUCAP,  in the last 72 hours   CXR: RLL, RML airspace disease CT Chest w/ occlusion RML bronchus   ASSESSMENT / PLAN:  PULMONARY A: HCAP unresponsive to outpatient therapy; CT chest worrisome for RML obstruction, ddx includes mucus plugging vs malignancy  Mild to moderate  pulmonary hypertension by [facility] in 2011; WHO class 2 from diastolic CHF   P:   -agree with current antibiotics -plan FOB 9/2 after holding heparin, 15:00 -no indication for pulmonary vasodilators in WHO class 2 pulmonary hypertension  CARDIOVASCULAR A: diastolic CHF, currently well compensated Afib on warfarin P:  -continue metop, lasix, amlodipine per [facility]  RENAL A:  CKD at baseline Hyponatremia P:   -per [facility]   INFECTIOUS A:  HCAP P:   -agree with vanc/maxipeme -f/u cultures  NEUROLOGIC A:  Mild confusion P:   -monitor neuro status -minimize sedating meds   Levy Pupa, MD, PhD 09/21/2012, 11:39 AM [facility] [phone] or if no answer [phone]

## 2012-09-21 NOTE — Progress Notes (Signed)
SLP Cancellation Note  Patient Details Name: Cheryl [name] MRN: [id number] DOB: November 06, 1925   Cancelled treatment:       Reason Eval/Treat Not Completed: Per MD, pt is to be made NPO for procedure to be performed this morning. Current diet order is Dys. 3 textures and thin liquids, however SLP was instructed not to provide PO trials. Will continue to follow.   Maxcine Ham 09/21/2012, 8:33 AM  Maxcine Ham, M.A. CCC-SLP

## 2012-09-21 NOTE — Progress Notes (Addendum)
ANTICOAGULATION/ANTIBIOTIC CONSULT NOTE - Follow Up Consult  Pharmacy Consult for heparin and vancomycin Indication: atrial fibrillation and PNA  Labs:  Recent Labs  09/19/12 0445 09/20/12 0405 09/20/12 1759 09/21/12 0420  HGB 10.6*  --   --  9.8*  HCT 29.9*  --   --  27.8*  PLT 304  --   --  293  LABPROT 26.4* 21.4*  --  18.9*  INR 2.53* 1.92*  --  1.63*  HEPARINUNFRC  --   --  <0.10* 0.31  CREATININE 2.56*  --   --  2.13*  VANC TR      15.7  Estimated Creatinine Clearance: 14.6 ml/min (by C-G formula based on Cr of 2.13).   Medications:  Scheduled:  . amLODipine  2.5 mg Oral Daily  . calcitRIOL  0.25 mcg Oral Daily  . ceFEPime (MAXIPIME) IV  1 g Intravenous Q24H  . cholecalciferol  800 Units Oral Daily  . feeding supplement  237 mL Oral BID BM  . furosemide  20 mg Oral Daily  . HYDROcodone-acetaminophen  1 tablet Oral q morning - 10a  . levothyroxine  75 mcg Oral Daily  . lubiprostone  24 mcg Oral BID WC  . metoprolol  100 mg Oral BID  . multivitamin with minerals  1 tablet Oral Daily  . oxybutynin  10 mg Oral Daily  . pantoprazole  40 mg Oral Daily  . polyethylene glycol  17 g Oral Daily  . sertraline  25 mg Oral Daily  . [START ON 09/23/2012] vancomycin  1,000 mg Intravenous Q48H    Assessment: 79yo female now therapeutic on heparin after rate increase.  Vanc trough results within goal range though drawn early and actually calculates slightly below goal w/ active MRSA infection.  Goal of Therapy:  Heparin level 0.3-0.7 units/ml Vancomycin trough 15-20   Plan:  Will continue heparin gtt at current rate and confirm stable with additional level.  Will increase vancomycin slightly to 1000mg  IV Q48H for calculated trough closer to 20.  Vernard Gambles, PharmD, BCPS  09/21/2012,6:05 AM   Addendum: Repeat heparin level is 0.46 on 850 units/hr. No bleeding noted. Heparin is off for bronch this afternoon.   Will f/u after bronch to see if heparin is to be  resumed F/u resume Coumadin when able  [facility], [place].D., BCPS Clinical Pharmacist Pager: [phone] 09/21/2012 1:56 PM

## 2012-09-21 NOTE — Progress Notes (Signed)
Report given to receiving RN. Patient is stable. No signs or symptoms of distress or discomfort.  

## 2012-09-21 NOTE — H&P (View-Only) (Signed)
PULMONARY  / CRITICAL CARE MEDICINE  Name: Cheryl [name] MRN: [id number] DOB: 01/30/1925    ADMISSION DATE:  09/14/2012 CONSULTATION DATE:  09/15/2012  REFERRING MD :  [place] [facility] PRIMARY SERVICE: [facility]  CHIEF COMPLAINT:  Non-resolving pneumonia  BRIEF PATIENT DESCRIPTION: 79 y/o female was admitted on 8/26 to the [facility] service at [facility] for pneumonia unresponsive to antibiotics as an outpatient; a CT chest was concerning for an endobronchial lesion causing post obstructive pneumonia.  SIGNIFICANT EVENTS / STUDIES:  8/27 CT chest >> complete occlusion of the RML bronchus with dense RML and RLL consolidation and interlobular septal thickening; enlarge paratracheal lymph nodes   LINES / TUBES:   CULTURES: 8/27 sputum >> MRSA & Enterobacter Aerogenes (sens Maxipeme) 8/27 blood >> no growth  HISTORY OF PRESENT ILLNESS:   79 y/o female was admitted on 8/26 to the [facility] service at [facility] for pneumonia unresponsive to antibiotics as an outpatient; a CT chest was concerning for an endobronchial lesion causing post obstructive pneumonia. She cannot recall the details of her illness on my visit.  She stated that she had been experiencing a strong cough for several days but did not recall whether or not she has been treated with antibiotics or the duration of the illness.  Chart review notes that she has been treated with antibiotics as an outpatient and was admitted for persistent fevers.  She underwent a hip replacement in June.  SUBJECTIVE:  Feels better, still w cough Off coumadin, on heparin  VITAL SIGNS: Temp:  [98.1 F (36.7 C)-98.8 F (37.1 C)] 98.5 F (36.9 C) (09/02 0547) Pulse Rate:  [72-76] 72 (09/02 1002) Resp:  [18-20] 19 (09/02 0547) BP: (107-148)/(56-64) 140/58 mmHg (09/02 1002) SpO2:  [94 %-95 %] 95 % (09/02 0547) Weight:  [53.388 kg (117 lb 11.2 oz)] 53.388 kg (117 lb 11.2 oz) (09/02 0547) HEMODYNAMICS:   VENTILATOR SETTINGS:   INTAKE / OUTPUT: Intake/Output     09/01 0701  - 09/02 0700 09/02 0701 - 09/03 0700   P.O. 1200    I.V. (mL/kg) 55.6 (1)    IV Piggyback 150    Total Intake(mL/kg) 1405.6 (26.3)    Urine (mL/kg/hr) 1750 (1.4)    Total Output 1750     Net -344.4          Stool Occurrence 1 x      PHYSICAL EXAMINATION: Gen: elderly female, no acute distress HEENT: NCAT, PERRL, EOMi, OP clear, neck supple without masses PULM: poor air movement RLL, crackles RLL, clear on left CV: Irreg irreg, no mgr, no JVD AB: BS+, soft, nontender, no hsm Ext: warm, no edema, no clubbing, no cyanosis Derm: no rash or skin breakdown Neuro: Awake and alert, oriented to place, situation, but cannot give detailed answers, follows commands, maew   LABS:  CBC Recent Labs     09/19/12  0445  09/21/12  0420  WBC  13.0*  10.5  HGB  10.6*  9.8*  HCT  29.9*  27.8*  PLT  304  293   Coag's Recent Labs     09/19/12  0445  09/20/12  0405  09/21/12  0420  INR  2.53*  1.92*  1.63*   BMET Recent Labs     09/19/12  0445  09/21/12  0420  NA  127*  127*  K  3.9  4.1  CL  88*  90*  CO2  28  27  BUN  57*  48*  CREATININE  2.56*  2.13*    GLUCOSE  114*  92   Electrolytes Recent Labs     09/19/12  0445  09/21/12  0420  CALCIUM  9.0  8.9   Sepsis Markers No results found for this basename: LACTICACIDVEN, PROCALCITON, O2SATVEN,  in the last 72 hours ABG No results found for this basename: PHART, PCO2ART, PO2ART,  in the last 72 hours Liver Enzymes No results found for this basename: AST, ALT, ALKPHOS, BILITOT, ALBUMIN,  in the last 72 hours Cardiac Enzymes No results found for this basename: TROPONINI, PROBNP,  in the last 72 hours Glucose No results found for this basename: GLUCAP,  in the last 72 hours   CXR: RLL, RML airspace disease CT Chest w/ occlusion RML bronchus   ASSESSMENT / PLAN:  PULMONARY A: HCAP unresponsive to outpatient therapy; CT chest worrisome for RML obstruction, ddx includes mucus plugging vs malignancy  Mild to moderate  pulmonary hypertension by [facility] in 2011; WHO class 2 from diastolic CHF   P:   -agree with current antibiotics -plan FOB 9/2 after holding heparin, 15:00 -no indication for pulmonary vasodilators in WHO class 2 pulmonary hypertension  CARDIOVASCULAR A: diastolic CHF, currently well compensated Afib on warfarin P:  -continue metop, lasix, amlodipine per [facility]  RENAL A:  CKD at baseline Hyponatremia P:   -per [facility]   INFECTIOUS A:  HCAP P:   -agree with vanc/maxipeme -f/u cultures  NEUROLOGIC A:  Mild confusion P:   -monitor neuro status -minimize sedating meds   Robert Byrum, MD, PhD 09/21/2012, 11:39 AM [facility] [phone] or if no answer [phone]  

## 2012-09-21 NOTE — Progress Notes (Signed)
ANTICOAGULATION CONSULT NOTE - Follow Up Consult  Pharmacy Consult for Heparin Indication: atrial fibrillation  Allergies  Allergen Reactions  . Codeine Other (See Comments)    unknown  . Sulfa Antibiotics     unknown    Patient Measurements: Height: 5\' 1"  (154.9 cm) Weight: 117 lb 11.2 oz (53.388 kg) (Scale C) IBW/kg (Calculated) : 47.8 Heparin Dosing Weight: 53.4 kg  Vital Signs: Temp: 98.5 F (36.9 C) (09/02 0547) Temp src: Oral (09/02 0547) BP: 129/57 mmHg (09/02 1625) Pulse Rate: 72 (09/02 1002)  Labs:  Recent Labs  09/19/12 0445 09/20/12 0405 09/20/12 1759 09/21/12 0420 09/21/12 1150  HGB 10.6*  --   --  9.8*  --   HCT 29.9*  --   --  27.8*  --   PLT 304  --   --  293  --   LABPROT 26.4* 21.4*  --  18.9*  --   INR 2.53* 1.92*  --  1.63*  --   HEPARINUNFRC  --   --  <0.10* 0.31 0.46  CREATININE 2.56*  --   --  2.13*  --     Estimated Creatinine Clearance: 14.6 ml/min (by C-G formula based on Cr of 2.13).  Assessment:   Heparin drip to resume at 8pm tonight, about 4 hours post-bronchoscopy and biopsies.  Heparin drip was stopped level pre-procedure was therapeutic (0.46) on 850 units/hr, then held prior to procedure.  Coumadin has been on hold for procedure.  INR down to 1.63 today.   Goal of Therapy:  Heparin level 0.3-0.7 units/ml Monitor platelets by anticoagulation protocol: Yes   Plan:   Resume heparin drip at 850 units/hr at 8pm tonight.  Heparin level ~ 8 hours after drip resumes -> 4am on 9/3.  Continue daily heparin level, CBC and PT/INR.  Will follow up for resuming Coumadin.  Dennie Fetters, [place] Pager: [phone] 09/21/2012,5:22 PM

## 2012-09-22 ENCOUNTER — Encounter (HOSPITAL_COMMUNITY): Payer: Self-pay | Admitting: Emergency Medicine

## 2012-09-22 DIAGNOSIS — N186 End stage renal disease: Secondary | ICD-10-CM

## 2012-09-22 LAB — CBC
HCT: 28.7 % — ABNORMAL LOW (ref 36.0–46.0)
MCHC: 34.5 g/dL (ref 30.0–36.0)
MCV: 100.7 fL — ABNORMAL HIGH (ref 78.0–100.0)
Platelets: 278 10*3/uL (ref 150–400)
RDW: 17.4 % — ABNORMAL HIGH (ref 11.5–15.5)
WBC: 10 10*3/uL (ref 4.0–10.5)

## 2012-09-22 LAB — PROTIME-INR: INR: 1.39 (ref 0.00–1.49)

## 2012-09-22 LAB — HEPARIN LEVEL (UNFRACTIONATED): Heparin Unfractionated: <0.1 IU/mL — ABNORMAL LOW (ref 0.30–0.70)

## 2012-09-22 MED ORDER — WARFARIN - PHARMACIST DOSING INPATIENT
Freq: Every day | Status: DC
  Administered 2012-09-22 – 2012-09-24 (×3)

## 2012-09-22 MED ORDER — WARFARIN SODIUM 5 MG PO TABS
5.0000 mg | ORAL_TABLET | Freq: Once | ORAL | Status: AC
  Administered 2012-09-22: 17:00:00 5 mg via ORAL
  Filled 2012-09-22: qty 1

## 2012-09-22 NOTE — Progress Notes (Signed)
ANTICOAGULATION CONSULT NOTE - Follow Up Consult  Pharmacy Consult for Heparin / warfarin Indication: atrial fibrillation  Allergies  Allergen Reactions  . Codeine Other (See Comments)    unknown  . Sulfa Antibiotics     unknown    Patient Measurements: Height: 5\' 1"  (154.9 cm) Weight: 115 lb 14.4 oz (52.572 kg) IBW/kg (Calculated) : 47.8 Heparin Dosing Weight: 53.4 kg  Vital Signs: Temp: 98 F (36.7 C) (09/03 1300) Temp src: Oral (09/03 1300) BP: 153/75 mmHg (09/03 1300) Pulse Rate: 67 (09/03 1300)  Labs:  Recent Labs  09/20/12 0405  09/21/12 0420 09/21/12 1150 09/22/12 0511 09/22/12 1456  HGB  --   --  9.8*  --  9.9*  --   HCT  --   --  27.8*  --  28.7*  --   PLT  --   --  293  --  278  --   LABPROT 21.4*  --  18.9*  --  16.7*  --   INR 1.92*  --  1.63*  --  1.39  --   HEPARINUNFRC  --   < > 0.31 0.46 <0.10* 0.52  CREATININE  --   --  2.13*  --   --   --   < > = values in this interval not displayed.  Estimated Creatinine Clearance: 14.6 ml/min (by C-G formula based on Cr of 2.13).  Assessment: Heparin drip resumed yesterday (4 hours post-bronchoscopy and biopsies).  Heparin level undetectable this a.m. on 850 units/hr  And heparin drip increased to 950uts/hr.  IV heparin line later infiltrated causing large right forearm hematoma - probably reason for low HL this AM.  Heparin drip 950uts/hr and new IV line resilted in HL 0.52.  Concern for heparin to accumulate in this elderly pt with CKD.  Will decrease heparin drip rate to keep HL closer to 0.3  Hgb remains low but stable.  Coumadin had been on hold for procedure and will restart today.  INR down to 1.39 today.   Goal of Therapy:  Heparin level 0.3-0.7 units/ml Monitor platelets by anticoagulation protocol: Yes  INR 2-3 Plan:  Decrease heparin drip to 850 units/hr  Coumadin 5mg  x1 tonight Daily HL and Protime Monitor for s/s bleeding  Leota Sauers Pharm.D. CPP, BCPS Clinical  Pharmacist [phone] 09/22/2012 3:47 PM

## 2012-09-22 NOTE — Progress Notes (Signed)
Stopped infusion and removed patient's IV due to infiltration. Elevated right forearm and applied a warm compress. Made MD aware. No new orders given. Will continue to monitor patient for further changes in condition.

## 2012-09-22 NOTE — Progress Notes (Signed)
Report given to receiving RN. Patient is stable. No signs or symptoms of distress or discomfort.  

## 2012-09-22 NOTE — Progress Notes (Signed)
PULMONARY  / CRITICAL CARE MEDICINE  Name: Cheryl [name] MRN: [id number] DOB: 07/17/1925    ADMISSION DATE:  09/14/2012 CONSULTATION DATE:  09/15/2012  REFERRING MD :  York Spaniel [facility] PRIMARY SERVICE: [facility]  CHIEF COMPLAINT:  Non-resolving pneumonia  BRIEF PATIENT DESCRIPTION: 79 y/o female was admitted on 8/26 to the [facility] service at [facility] for pneumonia unresponsive to antibiotics as an outpatient; a CT chest was concerning for an endobronchial lesion causing post obstructive pneumonia.  SIGNIFICANT EVENTS / STUDIES:  8/27 CT chest >> complete occlusion of the RML bronchus with dense RML and RLL consolidation and interlobular septal thickening; enlarge paratracheal lymph nodes 9/2 FOB>> no obvious EBL but friable tissue, path, washing sent   LINES / TUBES:   CULTURES: 8/27 sputum >> MRSA & Enterobacter Aerogenes (sens Maxipeme) 8/27 blood >> no growth  HISTORY OF PRESENT ILLNESS:   79 y/o female was admitted on 8/26 to the [facility] service at [facility] for pneumonia unresponsive to antibiotics as an outpatient; a CT chest was concerning for an endobronchial lesion causing post obstructive pneumonia. She cannot recall the details of her illness on my visit.  She stated that she had been experiencing a strong cough for several days but did not recall whether or not she has been treated with antibiotics or the duration of the illness.  Chart review notes that she has been treated with antibiotics as an outpatient and was admitted for persistent fevers.  She underwent a hip replacement in June.  SUBJECTIVE:  Feels better, working with ST   VITAL SIGNS: Temp:  [98 F (36.7 C)-98.1 F (36.7 C)] 98 F (36.7 C) (09/03 0640) Pulse Rate:  [71-86] 71 (09/03 0640) Resp:  [13-28] 19 (09/03 0640) BP: (122-183)/(53-85) 123/57 mmHg (09/03 0640) SpO2:  [92 %-100 %] 93 % (09/03 0640) Weight:  [115 lb 14.4 oz (52.572 kg)] 115 lb 14.4 oz (52.572 kg) (09/03 0640) HEMODYNAMICS:   VENTILATOR SETTINGS:   INTAKE /  OUTPUT: Intake/Output     09/02 0701 - 09/03 0700 09/03 0701 - 09/04 0700   P.O.  360   I.V. (mL/kg) 142.3 (2.7)    IV Piggyback     Total Intake(mL/kg) 142.3 (2.7) 360 (6.8)   Urine (mL/kg/hr) 1300 (1)    Total Output 1300     Net -1157.7 +360          PHYSICAL EXAMINATION: Gen: elderly female, no acute distress HEENT: NCAT, PERRL, EOMi, OP clear, neck supple without masses PULM: resps even non labored, diminished RLL, few scattered ronchi R>L, clear on left CV: Irreg irreg, no mgr, no JVD AB: BS+, soft, nontender, no hsm Ext: warm, no edema, no clubbing, no cyanosis Derm: no rash or skin breakdown Neuro: Awake and alert, oriented to place, situation, but cannot give detailed answers, follows commands, maew   LABS:  CBC Recent Labs     09/21/12  0420  09/22/12  0511  WBC  10.5  10.0  HGB  9.8*  9.9*  HCT  27.8*  28.7*  PLT  293  278   Coag's Recent Labs     09/20/12  0405  09/21/12  0420  09/22/12  0511  INR  1.92*  1.63*  1.39   BMET Recent Labs     09/21/12  0420  NA  127*  K  4.1  CL  90*  CO2  27  BUN  48*  CREATININE  2.13*  GLUCOSE  92   Electrolytes Recent Labs  09/21/12  0420  CALCIUM  8.9   Sepsis Markers No results found for this basename: LACTICACIDVEN, PROCALCITON, O2SATVEN,  in the last 72 hours ABG No results found for this basename: PHART, PCO2ART, PO2ART,  in the last 72 hours Liver Enzymes No results found for this basename: AST, ALT, ALKPHOS, BILITOT, ALBUMIN,  in the last 72 hours Cardiac Enzymes No results found for this basename: TROPONINI, PROBNP,  in the last 72 hours Glucose No results found for this basename: GLUCAP,  in the last 72 hours   CXR: RLL, RML airspace disease CT Chest w/ occlusion RML bronchus   ASSESSMENT / PLAN:  PULMONARY A: HCAP unresponsive to outpatient therapy; CT chest worrisome for RML obstruction, ddx includes mucus plugging vs malignancy S/p FOB 9/2  Mild to moderate pulmonary  hypertension by [facility] in 2011; WHO class 2 from diastolic CHF   P:   -agree with current antibiotics -f/u results FOB yet 9/3 -no indication for pulmonary vasodilators in WHO class 2 pulmonary hypertension -f/u path, cytology   CARDIOVASCULAR A: diastolic CHF, currently well compensated Afib on warfarin P:  -continue metop, lasix, amlodipine per TRH  RENAL A:  CKD at baseline Hyponatremia P:   -per [facility]   INFECTIOUS A:  HCAP P:   -agree with vanc/maxipeme, may require extended course with post-obstructive process -f/u cultures  NEUROLOGIC A:  Mild confusion P:   -monitor neuro status -minimize sedating meds   WHITEHEART,KATHRYN, NP 09/22/2012  9:47 AM Pager: [phone] or [phone]  *Care during the described time interval was provided by me and/or other providers on the critical care team. I have reviewed this patient's available data, including medical history, events of note, physical examination and test results as part of my evaluation.  Levy Pupa, MD, PhD 09/22/2012, 11:47 AM [facility] [phone] or if no answer [phone]

## 2012-09-22 NOTE — Progress Notes (Signed)
Speech Language Pathology Dysphagia Treatment Patient Details Name: Cheryl [name] MRN: [id number] DOB: [birth year] Today's Date: 09/22/2012 Time: 4132-4401 SLP Time Calculation (min): 28 min  Assessment / Plan / Recommendation Clinical Impression  Pt observed with Dys. 3 textures and thin liquids with no overt s/s of aspiration noted. Min verbal cues were provided for utilization of compensatory strategies. Pt was unable to recall results/recommendations from [facility] 8/29, so SLP provided education. Will f/u for diet tolerance, utilization of strategies, and further education.    Diet Recommendation  Continue with Current Diet: Dysphagia 3 (mechanical soft);Thin liquid    SLP Plan Continue with current plan of care   Pertinent Vitals/Pain N/A   Swallowing Goals  SLP Swallowing Goals Patient will consume recommended diet without observed clinical signs of aspiration with: Set-up Swallow Study Goal #1 - Progress: Progressing toward goal Patient will utilize recommended strategies during swallow to increase swallowing safety with: Set-up Swallow Study Goal #2 - Progress: Progressing toward goal  General Temperature Spikes Noted: No Respiratory Status: Room air Behavior/Cognition: Alert;Cooperative;Pleasant mood;Hard of hearing Oral Cavity - Dentition: Dentures, bottom;Dentures, top Patient Positioning: Upright in bed  Oral Cavity - Oral Hygiene Does patient have any of the following "at risk" factors?: None of the above Brush patient's teeth BID with toothbrush (using toothpaste with fluoride): Yes   Dysphagia Treatment Treatment focused on: Skilled observation of diet tolerance;Patient/family/caregiver education;Utilization of compensatory strategies Treatment Methods/Modalities: Skilled observation;Differential diagnosis Patient observed directly with PO's: Yes Type of PO's observed: Dysphagia 3 (soft);Thin liquids Feeding: Able to feed self Liquids provided via: Cup;No  straw Type of cueing: Verbal Amount of cueing: Minimal   GO     Maxcine Ham 09/22/2012, 9:38 AM  Maxcine Ham, M.A. CCC-SLP

## 2012-09-22 NOTE — Progress Notes (Signed)
ANTICOAGULATION CONSULT NOTE - Follow Up Consult  Pharmacy Consult for Heparin Indication: atrial fibrillation  Allergies  Allergen Reactions  . Codeine Other (See Comments)    unknown  . Sulfa Antibiotics     unknown    Patient Measurements: Height: 5\' 1"  (154.9 cm) Weight: 117 lb 11.2 oz (53.388 kg) (Scale C) IBW/kg (Calculated) : 47.8 Heparin Dosing Weight: 53.4 kg  Vital Signs: Temp: 98.1 F (36.7 C) (09/02 2003) Temp src: Oral (09/02 2003) BP: 126/58 mmHg (09/02 2236) Pulse Rate: 86 (09/02 2236)  Labs:  Recent Labs  09/20/12 0405  09/21/12 0420 09/21/12 1150 09/22/12 0511  HGB  --   --  9.8*  --  9.9*  HCT  --   --  27.8*  --  28.7*  PLT  --   --  293  --  278  LABPROT 21.4*  --  18.9*  --  16.7*  INR 1.92*  --  1.63*  --  1.39  HEPARINUNFRC  --   < > 0.31 0.46 <0.10*  CREATININE  --   --  2.13*  --   --   < > = values in this interval not displayed.  Estimated Creatinine Clearance: 14.6 ml/min (by C-G formula based on Cr of 2.13).  Assessment: Heparin drip resumed yesterday (4 hours post-bronchoscopy and biopsies).  Heparin level undetectable this a.m. on 850 units/hr. Spoke with RN and no issues noted with infusion. Hgb remains low but stable. Noted that pre-procedure heparin level was therapeutic (0.46) on 850 units/hr. Coumadin has been on hold for procedure.  INR down to 1.39 today.   Goal of Therapy:  Heparin level 0.3-0.7 units/ml Monitor platelets by anticoagulation protocol: Yes   Plan:   Increase heparin drip to 950 units/hr   Will f/u 8 hr heparin level  Will follow up for resuming Coumadin.  Christoper Fabian, PharmD, BCPS Clinical pharmacist, pager [phone] 09/22/2012,6:32 AM

## 2012-09-22 NOTE — Progress Notes (Signed)
Met with patient at bedside to explain [facility] Care Management services. She reports she is not interested at this time. Left brochure at bedside for her daughter to look at and for her to call if she changes her mind in the future. Will make inpatient RNCM aware of conversation.  Raiford Noble, MSN- Ed, Charity fundraiser, BSN- [facility] [phone]

## 2012-09-22 NOTE — Progress Notes (Signed)
[facility] PROGRESS NOTE  LATORSHA CURLING ZOX:[id number] DOB: [birth year] DOA: 09/14/2012 PCP: Abigail Miyamoto, MD  Assessment/Plan: HCAP/ post obstructive pneumonia, SIRS; CT chest R lung bronch obstruction midd/lowr lobe;  -s/p bronchoscopy broch and Bx RML broch; IV heparin per pharmacy; no signs of bleeding, so will start coumadin -Patient was empirically on zosyn and vancomycin (8/27) pharm to dose, but c/s showed +MRSA and ENTEROBACTER AEROGENES; atx changed to cefepime+vanc based on sensitivity (8/30)  -diet changed per speech therapy eval; abx to be changed to PO upon d/c   [name] - HR controlled; off coumadin for procedure; pharm to dose heparin, cardiac monitor, continue home rate control meds  Hyponatremic - chronic and baseline; likely lung related/SIADH;   ESRD - technically appears to be CKD stage 4 with GFR of about 20, this appears to be her baseline going back for several months. She does have a dialysis graft in her L arm which was put in Oct of last year, it has a strong pulsatile thrill, this graft has not been used for dialysis yet per patient. Continue home meds, monitor BMP while here but does not appear to be an acute issue at this time. -resumed lasix on 9/1 at 40 mg; Was on hold for Low BP   HTN; 8/29 BP was on low; decrease amlodipine from 5-->2.5 (8/29); lasix was on hold (home dose 40 mg); resume 9/1; cont BB; monitor   Hypothyroidism; on levothyroxine; recheck tsh   Code Status: full Family Communication:   Disposition Plan:    Consultants:  pulm  Procedures:    Antibiotics:    HPI/Subjective: IV infiltrated + cough  Objective: Filed Vitals:   09/22/12 1002  BP: 118/59  Pulse: 71  Temp:   Resp:     Intake/Output Summary (Last 24 hours) at 09/22/12 1033 Last data filed at 09/22/12 0836  Gross per 24 hour  Intake 502.26 ml  Output   1025 ml  Net -522.74 ml   Filed Weights   09/20/12 0549 09/21/12 0547 09/22/12 0640   Weight: 52.8 kg (116 lb 6.5 oz) 53.388 kg (117 lb 11.2 oz) 52.572 kg (115 lb 14.4 oz)    Exam:  General: Alert awake oriented; HOH  Cardiovascular: S1, S2  Respiratory: mild rhonchi  Abdomen: soft, ND, ND  Musculoskeletal: non tender    Data Reviewed: Basic Metabolic Panel:  Recent Labs Lab 09/16/12 0420 09/17/12 0550 09/18/12 0449 09/19/12 0445 09/21/12 0420  NA 126* 127* 127* 127* 127*  K 4.5 3.1* 3.5 3.9 4.1  CL 85* 86* 87* 88* 90*  CO2 30 24 26 28 27   GLUCOSE 92 109* 117* 811* 92  BUN 61* 56* 62* 57* 48*  CREATININE 2.32* 2.61* 2.79* 2.56* 2.13*  CALCIUM 9.2 8.7 8.8 9.0 8.9   Liver Function Tests: No results found for this basename: AST, ALT, ALKPHOS, BILITOT, PROT, ALBUMIN,  in the last 168 hours No results found for this basename: LIPASE, AMYLASE,  in the last 168 hours No results found for this basename: AMMONIA,  in the last 168 hours CBC:  Recent Labs Lab 09/16/12 0420 09/17/12 0550 09/19/12 0445 09/21/12 0420 09/22/12 0511  WBC 22.1* 16.0* 13.0* 10.5 10.0  HGB 11.1* 10.7* 10.6* 9.8* 9.9*  HCT 30.8* 30.2* 29.9* 27.8* 28.7*  MCV 100.0 100.3* 99.3 99.3 100.7*  PLT 284 315 304 293 278   Cardiac Enzymes: No results found for this basename: CKTOTAL, CKMB, CKMBINDEX, TROPONINI,  in the last 168 hours BNP (last 3 results) No  results found for this basename: PROBNP,  in the last 8760 hours CBG: No results found for this basename: GLUCAP,  in the last 168 hours  Recent Results (from the past 240 hour(s))  CULTURE, BLOOD (ROUTINE X 2)     Status: None   Collection Time    09/15/12 12:20 AM      Result Value Range Status   Specimen Description BLOOD RIGHT ARM   Final   Special Requests     Final   Value: BOTTLES DRAWN AEROBIC AND ANAEROBIC 10CC BLUE 8CC RED   Culture  Setup Time     Final   Value: 09/15/2012 11:06     Performed at [facility]   Culture     Final   Value: NO GROWTH 5 DAYS     Performed at [facility]   Report  Status 09/21/2012 FINAL   Final  CULTURE, BLOOD (ROUTINE X 2)     Status: None   Collection Time    09/15/12 12:30 AM      Result Value Range Status   Specimen Description BLOOD RIGHT WRIST   Final   Special Requests BOTTLES DRAWN AEROBIC ONLY [facility]   Final   Culture  Setup Time     Final   Value: 09/15/2012 11:06     Performed at [facility]   Culture     Final   Value: NO GROWTH 5 DAYS     Performed at [facility]   Report Status 09/21/2012 FINAL   Final  CULTURE, EXPECTORATED SPUTUM-ASSESSMENT     Status: None   Collection Time    09/15/12  9:11 AM      Result Value Range Status   Specimen Description SPUTUM   Final   Special Requests NONE   Final   Sputum evaluation     Final   Value: THIS SPECIMEN IS ACCEPTABLE. RESPIRATORY CULTURE REPORT TO FOLLOW.   Report Status 09/15/2012 FINAL   Final  CULTURE, RESPIRATORY (NON-EXPECTORATED)     Status: None   Collection Time    09/15/12  9:11 AM      Result Value Range Status   Specimen Description SPUTUM   Final   Special Requests NONE   Final   Gram Stain     Final   Value: ABUNDANT WBC PRESENT,BOTH PMN AND MONONUCLEAR     NO SQUAMOUS EPITHELIAL CELLS SEEN     MODERATE GRAM POSITIVE COCCI     IN PAIRS IN CLUSTERS RARE GRAM NEGATIVE RODS     RARE YEAST   Culture     Final   Value: ABUNDANT METHICILLIN RESISTANT STAPHYLOCOCCUS AUREUS     Note: RIFAMPIN AND GENTAMICIN SHOULD NOT BE USED AS SINGLE DRUGS FOR TREATMENT OF STAPH INFECTIONS. CRITICAL RESULT CALLED TO, READ BACK BY AND VERIFIED WITH: PEGGY GERHARD RN 10AM 09/18/12 GUSTK     ABUNDANT ENTEROBACTER AEROGENES     Performed at [facility]   Report Status 09/18/2012 FINAL   Final   Organism ID, Bacteria METHICILLIN RESISTANT STAPHYLOCOCCUS AUREUS   Final   Organism ID, Bacteria ENTEROBACTER AEROGENES   Final  GRAM STAIN     Status: None   Collection Time    09/15/12  9:12 AM      Result Value Range Status   Specimen Description SPUTUM   Final    Special Requests NONE   Final   Gram Stain     Final   Value: ABUNDANT WBC  PRESENT,BOTH PMN AND MONONUCLEAR     MODERATE GRAM POSITIVE COCCI IN PAIRS IN CLUSTERS     RARE GRAM NEGATIVE RODS     RARE YEAST   Report Status 09/15/2012 FINAL   Final     Studies: Dg Chest Port 1 View  09/21/2012   *RADIOLOGY REPORT*  Clinical Data: Cough and congestion.  PORTABLE CHEST - 1 VIEW  Comparison: PA and lateral chest 09/14/2012 and CT chest 09/15/2012.  Findings: Right middle and lower lobe airspace disease has worsened.  Streaky opacity is identified in the left lung base. There is cardiomegaly.  No pneumothorax identified.  There is likely a small right pleural effusion.  IMPRESSION: Worsened right middle and lower lobe airspace disease.  New streaky opacity is also seen in the left lung base which could be atelectasis or pneumonia.   Original Report Authenticated By: Holley Dexter, M.D.    Scheduled Meds: . amLODipine  5 mg Oral Daily  . calcitRIOL  0.25 mcg Oral Daily  . ceFEPime (MAXIPIME) IV  1 g Intravenous Q24H  . cholecalciferol  800 Units Oral Daily  . feeding supplement  237 mL Oral Q24H  . furosemide  40 mg Oral Daily  . HYDROcodone-acetaminophen  1 tablet Oral q morning - 10a  . levothyroxine  75 mcg Oral Daily  . lubiprostone  24 mcg Oral BID WC  . metoprolol  100 mg Oral BID  . multivitamin with minerals  1 tablet Oral Daily  . oxybutynin  10 mg Oral Daily  . pantoprazole  40 mg Oral Daily  . polyethylene glycol  17 g Oral Daily  . sertraline  25 mg Oral Daily  . [START ON 09/23/2012] vancomycin  1,000 mg Intravenous Q48H   Continuous Infusions: . heparin 950 Units/hr (09/22/12 0714)    Principal Problem:   HCAP (healthcare-associated pneumonia) Active Problems:   Atrial fibrillation   Hyponatremia   ESRD (end stage renal disease)   Lung nodule    Time spent: 35    [facility], Yissel Habermehl  [facility] Pager [phone]. If 7PM-7AM, please contact night-coverage at  [email or web address], password [facility] 09/22/2012, 10:33 AM  LOS: 8 days

## 2012-09-22 NOTE — Progress Notes (Signed)
Pt. Alert and oriented this am. No s/s of distress or discomfort noted. No c/o pain. Pt. Rested well during the night. RN will continue to monitor pt. For changes in condition. Cheryl [name]  

## 2012-09-23 DIAGNOSIS — I10 Essential (primary) hypertension: Secondary | ICD-10-CM

## 2012-09-23 LAB — CBC
HCT: 29.3 % — ABNORMAL LOW (ref 36.0–46.0)
MCH: 34 pg (ref 26.0–34.0)
MCV: 101.7 fL — ABNORMAL HIGH (ref 78.0–100.0)
RBC: 2.88 MIL/uL — ABNORMAL LOW (ref 3.87–5.11)
WBC: 9 10*3/uL (ref 4.0–10.5)

## 2012-09-23 LAB — BASIC METABOLIC PANEL
BUN: 41 mg/dL — ABNORMAL HIGH (ref 6–23)
Chloride: 92 mEq/L — ABNORMAL LOW (ref 96–112)
Creatinine, Ser: 2.14 mg/dL — ABNORMAL HIGH (ref 0.50–1.10)
Glucose, Bld: 103 mg/dL — ABNORMAL HIGH (ref 70–99)
Potassium: 4.4 mEq/L (ref 3.5–5.1)

## 2012-09-23 MED ORDER — WARFARIN SODIUM 5 MG PO TABS
5.0000 mg | ORAL_TABLET | Freq: Once | ORAL | Status: AC
  Administered 2012-09-23: 5 mg via ORAL
  Filled 2012-09-23: qty 1

## 2012-09-23 MED ORDER — POLYETHYLENE GLYCOL 3350 17 G PO PACK
17.0000 g | PACK | Freq: Every day | ORAL | Status: DC | PRN
  Filled 2012-09-23: qty 1

## 2012-09-23 MED ORDER — DARBEPOETIN ALFA-POLYSORBATE 60 MCG/0.3ML IJ SOLN
60.0000 ug | Freq: Once | INTRAMUSCULAR | Status: AC
  Administered 2012-09-23: 15:00:00 60 ug via SUBCUTANEOUS
  Filled 2012-09-23 (×2): qty 0.3

## 2012-09-23 MED ORDER — ONDANSETRON HCL 4 MG/2ML IJ SOLN
4.0000 mg | Freq: Four times a day (QID) | INTRAMUSCULAR | Status: DC | PRN
  Administered 2012-09-23: 22:00:00 4 mg via INTRAVENOUS
  Filled 2012-09-23: qty 2

## 2012-09-23 NOTE — Evaluation (Signed)
Occupational Therapy Evaluation Patient Details Name: Cheryl [name] MRN: [id number] DOB: 05/13/1925 Today's Date: 09/23/2012 Time: 7829-5621 OT Time Calculation (min): 25 min  OT Assessment / Plan / Recommendation History of present illness Pt admitted with PNA that was unresponsive to outpt treatment.   Clinical Impression   Pt overall functioning at a supervision level in ADL.  All equipment needs are met. Pt will return to living with daughter with 24 hour care.  No further OT needs.    OT Assessment  Patient does not need any further OT services    Follow Up Recommendations  Supervision/Assistance - 24 hour;No OT follow up    Barriers to Discharge      Equipment Recommendations  None recommended by OT    Recommendations for Other Services    Frequency       Precautions / Restrictions     Pertinent Vitals/Pain VSS, on RA, no pain    ADL  Eating/Feeding: Independent Where Assessed - Eating/Feeding: Chair Grooming: Wash/dry hands;Supervision/safety Where Assessed - Grooming: Unsupported standing Upper Body Bathing: Minimal assistance (for back) Where Assessed - Upper Body Bathing: Unsupported sitting Lower Body Bathing: Supervision/safety Where Assessed - Lower Body Bathing: Unsupported sitting;Supported sit to stand Upper Body Dressing: Set up Where Assessed - Upper Body Dressing: Unsupported sitting Lower Body Dressing: Supervision/safety Where Assessed - Lower Body Dressing: Unsupported sitting;Supported sit to stand Toilet Transfer: Supervision/safety Toilet Transfer Method: Sit to Barista: Regular height toilet;Grab bars Toileting - Clothing Manipulation and Hygiene: Modified independent Where Assessed - Engineer, mining and Hygiene: Sit on 3-in-1 or toilet Equipment Used: Cane Transfers/Ambulation Related to ADLs: supervision with cane ADL Comments: Pt assisted with her back, otherwise supervised for bathing,  dressing, and grooming.    OT Diagnosis:    OT Problem List:   OT Treatment Interventions:     OT Goals(Current goals can be found in the care plan section) Acute Rehab OT Goals Patient Stated Goal: home with daughter  Visit Information  Last OT Received On: 09/23/12 Assistance Needed: +1 History of Present Illness: Pt admitted with PNA that was unresponsive to outpt treatment.       Prior Functioning     Home Living Family/patient expects to be discharged to:: Private residence Living Arrangements: Children (plans to go home to daughter's) Available Help at Discharge: Family;Available 24 hours/day Type of Home: House Home Access: Level entry Home Layout: One level Home Equipment: Walker - 2 wheels;Cane - single point;Tub bench;Bedside commode Additional Comments: Has been living with her daughter since June '14. Prior Function Level of Independence: Independent with assistive device(s) Communication Communication: HOH Dominant Hand: Right         Vision/Perception Vision - History Baseline Vision: Wears glasses all the time Patient Visual Report: No change from baseline   Cognition  Cognition Arousal/Alertness: Awake/alert Behavior During Therapy: WFL for tasks assessed/performed Overall Cognitive Status: Within Functional Limits for tasks assessed    Extremity/Trunk Assessment Upper Extremity Assessment Upper Extremity Assessment: Overall WFL for tasks assessed Lower Extremity Assessment Lower Extremity Assessment: Defer to PT evaluation     Mobility Bed Mobility Bed Mobility: Not assessed Transfers Transfers: Sit to Stand;Stand to Sit Sit to Stand: 5: Supervision;With upper extremity assist;From chair/3-in-1;From toilet Stand to Sit: 5: Supervision;With upper extremity assist;To chair/3-in-1;To toilet     Exercise     Balance     End of Session OT - End of Session Activity Tolerance: Patient tolerated treatment well Patient left: in chair;with  call bell/phone within reach  GO     Evern Bio 09/23/2012, 10:19 AM [phone]

## 2012-09-23 NOTE — Evaluation (Signed)
Physical Therapy Evaluation Patient Details Name: NONI STONESIFER MRN: [id number] DOB: [birth year] Today's Date: 09/23/2012 Time: 1130-1200 PT Time Calculation (min): 30 min  PT Assessment / Plan / Recommendation History of Present Illness  79 y/o female was admitted on 8/26 to the [facility] service at [facility] for pneumonia unresponsive to antibiotics as an outpatient; a CT chest was concerning for an endobronchial lesion causing post obstructive pneumonia  Clinical Impression  Pt admitted with the above. Pt currently with functional limitations due to the deficits listed below (see PT Problem List). Pt willing to work and agreeable to use RW for energy conservation and overall balance.  Pt with LOB when using SPC.  Pt will benefit from skilled PT to increase their independence and safety with mobility to allow discharge to the venue listed below.      PT Assessment  Patient needs continued PT services    Follow Up Recommendations  Home health PT (safety evaluation)    Equipment Recommendations  None recommended by PT    Frequency Min 3X/week    Precautions / Restrictions Precautions Precautions: Fall   Pertinent Vitals/Pain No c/o pain; nonproductive cough after ambulation      Mobility  Bed Mobility Bed Mobility: Sit to Supine Sit to Supine: 4: Min guard Details for Bed Mobility Assistance: Minguard for safety Transfers Transfers: Sit to Stand;Stand to Sit Sit to Stand: 5: Supervision;With upper extremity assist;From chair/3-in-1;From toilet Stand to Sit: 5: Supervision;With upper extremity assist;To chair/3-in-1;To toilet Details for Transfer Assistance: Supervision for safety with cues for RW prior to sitting Ambulation/Gait Ambulation/Gait Assistance: 4: Min guard Ambulation Distance (Feet): 125 Feet Assistive device: Rolling walker;Straight cane Ambulation/Gait Assistance Details: Pt ambulated 30' with SPC however improved overall gait sequence and increase gait speed with  RW.  VCs for proper DME placement.  Pt with LOB x 1 but able to self correct. Pt leaning backwards with LOB. Gait Pattern: Step-through pattern;Decreased stride length;Shuffle;Trunk flexed Stairs: No     PT Diagnosis: Difficulty walking;Generalized weakness  PT Problem List: Decreased strength;Decreased activity tolerance;Decreased balance;Decreased mobility;Decreased knowledge of use of DME;Cardiopulmonary status limiting activity PT Treatment Interventions: DME instruction;Gait training;Functional mobility training;Therapeutic activities;Therapeutic exercise;Balance training;Patient/family education     PT Goals(Current goals can be found in the care plan section) Acute Rehab PT Goals Patient Stated Goal: home with daughter PT Goal Formulation: With patient Time For Goal Achievement: 09/30/12 Potential to Achieve Goals: Good  Visit Information  Last PT Received On: 09/23/12 Assistance Needed: +1 History of Present Illness: 79 y/o female was admitted on 8/26 to the [facility] service at [facility] for pneumonia unresponsive to antibiotics as an outpatient; a CT chest was concerning for an endobronchial lesion causing post obstructive pneumonia       Prior Functioning  Home Living Family/patient expects to be discharged to:: Private residence Living Arrangements: Children Available Help at Discharge: Family;Available 24 hours/day Type of Home: House Home Access: Level entry Home Layout: One level Home Equipment: Walker - 2 wheels;Cane - single point;Tub bench;Bedside commode Additional Comments: Has been living with her daughter since June '14. Prior Function Level of Independence: Independent with assistive device(s) Communication Communication: HOH Dominant Hand: Right    Cognition  Cognition Arousal/Alertness: Awake/alert Behavior During Therapy: WFL for tasks assessed/performed Overall Cognitive Status: Within Functional Limits for tasks assessed    Extremity/Trunk Assessment  Upper Extremity Assessment Upper Extremity Assessment: Overall WFL for tasks assessed Lower Extremity Assessment Lower Extremity Assessment: Overall WFL for tasks assessed Cervical / Trunk Assessment Cervical /  Trunk Assessment: Kyphotic (mild)   Balance    End of Session PT - End of Session Equipment Utilized During Treatment: Gait belt Activity Tolerance: Patient tolerated treatment well Patient left: in bed;with call bell/phone within reach Nurse Communication: Mobility status  GP     Kiah Vanalstine 09/23/2012, 2:05 PM  Jake Shark, PT DPT [phone]

## 2012-09-23 NOTE — Progress Notes (Addendum)
[facility] PROGRESS NOTE  Cheryl [name]:[id number] DOB: 06-13-1925 DOA: 09/14/2012 PCP: Abigail Miyamoto, MD  Assessment/Plan: - MRSA and Enterobacter Aerogenes Pneumonia/ post obstructive pneumonia, SIRS; CT chest R lung bronch obstruction midd/lowr lobe;  -s/p bronchoscopy broch and Bx RML broch; IV heparin per pharmacy; no signs of bleeding, so will start coumadin -Patient was empirically on zosyn and vancomycin (8/27) pharm to dose, but c/s showed +MRSA and ENTEROBACTER AEROGENES; atx changed to cefepime+vanc based on sensitivity (8/30)  -diet changed per speech therapy eval   [name] - HR controlled; off coumadin for procedure; pharm to dose heparin, cardiac monitor, continue home rate control meds- restart coumadin  Hyponatremic - chronic and baseline; likely lung related/SIADH;   ESRD - technically appears to be CKD stage 4 with GFR of about 20, this appears to be her baseline going back for several months. She does have a dialysis graft in her L arm which was put in Oct of last year, it has a strong pulsatile thrill, this graft has not been used for dialysis yet per patient. Continue home meds, monitor BMP while here but does not appear to be an acute issue at this time. -resumed lasix on 9/1 at 40 mg; Was on hold for Low BP   HTN; 8/29 BP was on low; decrease amlodipine from 5-->2.5 (8/29); lasix was on hold (home dose 40 mg); resume 9/1; cont BB; monitor   Hypothyroidism; on levothyroxine  PT eval- plan is for patient to go home with daughter  Code Status: full Family Communication:  patient Disposition Plan:    Consultants:  pulm  Procedures:    Antibiotics:    HPI/Subjective: IV infiltrated yest- no pain + cough  Objective: Filed Vitals:   09/23/12 0428  BP: 153/72  Pulse: 72  Temp: 98.4 F (36.9 C)  Resp: 18    Intake/Output Summary (Last 24 hours) at 09/23/12 0849 Last data filed at 09/23/12 0806  Gross per 24 hour  Intake     240 ml  Output   1250 ml  Net  -1010 ml   Filed Weights   09/21/12 0547 09/22/12 0640 09/23/12 0428  Weight: 53.388 kg (117 lb 11.2 oz) 52.572 kg (115 lb 14.4 oz) 53.3 kg (117 lb 8.1 oz)    Exam:  General: Alert awake oriented; HOH  Cardiovascular: S1, S2  Respiratory: mild rhonchi  Abdomen: soft, ND, ND  Musculoskeletal: non tender    Data Reviewed: Basic Metabolic Panel:  Recent Labs Lab 09/17/12 0550 09/18/12 0449 09/19/12 0445 09/21/12 0420 09/23/12 0640  NA 127* 127* 127* 127* 126*  K 3.1* 3.5 3.9 4.1 4.4  CL 86* 87* 88* 90* 92*  CO2 24 26 28 27 25   GLUCOSE 109* 117* 114* 92 103*  BUN 56* 62* 57* 48* 41*  CREATININE 2.61* 2.79* 2.56* 2.13* 2.14*  CALCIUM 8.7 8.8 9.0 8.9 9.0   Liver Function Tests: No results found for this basename: AST, ALT, ALKPHOS, BILITOT, PROT, ALBUMIN,  in the last 168 hours No results found for this basename: LIPASE, AMYLASE,  in the last 168 hours No results found for this basename: AMMONIA,  in the last 168 hours CBC:  Recent Labs Lab 09/17/12 0550 09/19/12 0445 09/21/12 0420 09/22/12 0511 09/23/12 0640  WBC 16.0* 13.0* 10.5 10.0 9.0  HGB 10.7* 10.6* 9.8* 9.9* 9.8*  HCT 30.2* 29.9* 27.8* 28.7* 29.3*  MCV 100.3* 99.3 99.3 100.7* 101.7*  PLT 315 304 293 278 266   Cardiac Enzymes: No results found  for this basename: CKTOTAL, CKMB, CKMBINDEX, TROPONINI,  in the last 168 hours BNP (last 3 results) No results found for this basename: PROBNP,  in the last 8760 hours CBG: No results found for this basename: GLUCAP,  in the last 168 hours  Recent Results (from the past 240 hour(s))  CULTURE, BLOOD (ROUTINE X 2)     Status: None   Collection Time    09/15/12 12:20 AM      Result Value Range Status   Specimen Description BLOOD RIGHT ARM   Final   Special Requests     Final   Value: BOTTLES DRAWN AEROBIC AND ANAEROBIC 10CC BLUE 8CC RED   Culture  Setup Time     Final   Value: 09/15/2012 11:06     Performed at [facility]   Culture     Final   Value: NO GROWTH 5 DAYS     Performed at [facility]   Report Status 09/21/2012 FINAL   Final  CULTURE, BLOOD (ROUTINE X 2)     Status: None   Collection Time    09/15/12 12:30 AM      Result Value Range Status   Specimen Description BLOOD RIGHT WRIST   Final   Special Requests BOTTLES DRAWN AEROBIC ONLY [facility]   Final   Culture  Setup Time     Final   Value: 09/15/2012 11:06     Performed at [facility]   Culture     Final   Value: NO GROWTH 5 DAYS     Performed at [facility]   Report Status 09/21/2012 FINAL   Final  CULTURE, EXPECTORATED SPUTUM-ASSESSMENT     Status: None   Collection Time    09/15/12  9:11 AM      Result Value Range Status   Specimen Description SPUTUM   Final   Special Requests NONE   Final   Sputum evaluation     Final   Value: THIS SPECIMEN IS ACCEPTABLE. RESPIRATORY CULTURE REPORT TO FOLLOW.   Report Status 09/15/2012 FINAL   Final  CULTURE, RESPIRATORY (NON-EXPECTORATED)     Status: None   Collection Time    09/15/12  9:11 AM      Result Value Range Status   Specimen Description SPUTUM   Final   Special Requests NONE   Final   Gram Stain     Final   Value: ABUNDANT WBC PRESENT,BOTH PMN AND MONONUCLEAR     NO SQUAMOUS EPITHELIAL CELLS SEEN     MODERATE GRAM POSITIVE COCCI     IN PAIRS IN CLUSTERS RARE GRAM NEGATIVE RODS     RARE YEAST   Culture     Final   Value: ABUNDANT METHICILLIN RESISTANT STAPHYLOCOCCUS AUREUS     Note: RIFAMPIN AND GENTAMICIN SHOULD NOT BE USED AS SINGLE DRUGS FOR TREATMENT OF STAPH INFECTIONS. CRITICAL RESULT CALLED TO, READ BACK BY AND VERIFIED WITH: PEGGY GERHARD RN 10AM 09/18/12 GUSTK     ABUNDANT ENTEROBACTER AEROGENES     Performed at [facility]   Report Status 09/18/2012 FINAL   Final   Organism ID, Bacteria METHICILLIN RESISTANT STAPHYLOCOCCUS AUREUS   Final   Organism ID, Bacteria ENTEROBACTER AEROGENES   Final  GRAM STAIN     Status: None    Collection Time    09/15/12  9:12 AM      Result Value Range Status   Specimen Description SPUTUM   Final   Special Requests  NONE   Final   Gram Stain     Final   Value: ABUNDANT WBC PRESENT,BOTH PMN AND MONONUCLEAR     MODERATE GRAM POSITIVE COCCI IN PAIRS IN CLUSTERS     RARE GRAM NEGATIVE RODS     RARE YEAST   Report Status 09/15/2012 FINAL   Final     Studies: No results found.  Scheduled Meds: . amLODipine  5 mg Oral Daily  . calcitRIOL  0.25 mcg Oral Daily  . ceFEPime (MAXIPIME) IV  1 g Intravenous Q24H  . cholecalciferol  800 Units Oral Daily  . feeding supplement  237 mL Oral Q24H  . furosemide  40 mg Oral Daily  . HYDROcodone-acetaminophen  1 tablet Oral q morning - 10a  . levothyroxine  75 mcg Oral Daily  . lubiprostone  24 mcg Oral BID WC  . metoprolol  100 mg Oral BID  . multivitamin with minerals  1 tablet Oral Daily  . oxybutynin  10 mg Oral Daily  . pantoprazole  40 mg Oral Daily  . polyethylene glycol  17 g Oral Daily  . sertraline  25 mg Oral Daily  . vancomycin  1,000 mg Intravenous Q48H  . Warfarin - Pharmacist Dosing Inpatient   Does not apply q1800   Continuous Infusions: . heparin 850 Units/hr (09/23/12 0209)    Principal Problem:   HCAP (healthcare-associated pneumonia) Active Problems:   Atrial fibrillation   Hyponatremia   ESRD (end stage renal disease)   Lung nodule    Time spent: 35    [facility], Myleen Brailsford  [facility] Pager [phone]. If 7PM-7AM, please contact night-coverage at [email or web address], password [facility] 09/23/2012, 8:49 AM  LOS: 9 days

## 2012-09-23 NOTE — Progress Notes (Signed)
Pt. Alert and oriented. No s/s of distress or discomfort noted. No c/o pain. Pt. Resting in room quietly. Call light within reach. RN will continue to monitor pt. For changes in condition. Retaj Hilbun, Cheryll Dessert

## 2012-09-23 NOTE — Progress Notes (Signed)
ANTICOAGULATION/ANTIBIOTIC CONSULT NOTE - Follow Up Consult  Pharmacy Consult for heparin, coumadin and vancomycin Indication: atrial fibrillation and PNA  Labs:  Recent Labs  09/21/12 0420  09/22/12 0511 09/22/12 1456 09/23/12 0640  HGB 9.8*  --  9.9*  --  9.8*  HCT 27.8*  --  28.7*  --  29.3*  PLT 293  --  278  --  266  LABPROT 18.9*  --  16.7*  --  17.2*  INR 1.63*  --  1.39  --  1.44  HEPARINUNFRC 0.31  < > <0.10* 0.52 0.69  CREATININE 2.13*  --   --   --  2.14*  < > = values in this interval not displayed.VANC TR      15.7  Estimated Creatinine Clearance: 14.5 ml/min (by C-G formula based on Cr of 2.14).   Medications:  Scheduled:  . amLODipine  5 mg Oral Daily  . calcitRIOL  0.25 mcg Oral Daily  . ceFEPime (MAXIPIME) IV  1 g Intravenous Q24H  . cholecalciferol  800 Units Oral Daily  . darbepoetin (ARANESP) injection - NON-DIALYSIS  60 mcg Subcutaneous Once  . feeding supplement  237 mL Oral Q24H  . furosemide  40 mg Oral Daily  . HYDROcodone-acetaminophen  1 tablet Oral q morning - 10a  . levothyroxine  75 mcg Oral Daily  . lubiprostone  24 mcg Oral BID WC  . metoprolol  100 mg Oral BID  . multivitamin with minerals  1 tablet Oral Daily  . oxybutynin  10 mg Oral Daily  . pantoprazole  40 mg Oral Daily  . polyethylene glycol  17 g Oral Daily  . sertraline  25 mg Oral Daily  . vancomycin  1,000 mg Intravenous Q48H  . Warfarin - Pharmacist Dosing Inpatient   Does not apply q1800    Assessment: 79yo female now on bridge to coumadin for afib. Heparin level (0.69) high-end therapeutic goal on 850 units/hr, INR 1.44, trending up, hgb and plts are stable, No bleeding noted per chart.  Patient is also on vancomycin and cefepime for HCAP, Afebrile, wbc wnl. Renal function stable.  Vanc 8/27>> Zosyn 8/27>>8/30 cefpeime 8/30>>  9/2 VT 15.7 (drawn 1.5 hr early) on 750 q48h, incr 1000 q48h  8/27 sputum - MRSA (MIC 1), Enterobacter (R-ancef, cefoxitin, ceftaz, zosyn;  S-cefepime, cipro, imipenem, AG, tmp-smz) 8/27 BCx2 - NEG  Goal of Therapy:  Heparin level 0.3-0.7 units/ml Vancomycin trough 15-20   Plan:  - Decrease heparin infusion rate slightly to 800 units/hr to avoid accumulation - Coumadin 5mg  po x 1 this evening - f/u AM INR, heparin level and CBC - Continue vancomycin 1g IV Q 48 hrs - Continue cefepime 1g IV Q 24 hrs  Bayard Hugger, PharmD, BCPS  Clinical Pharmacist  Pager: [phone]   09/23/2012,9:11 AM

## 2012-09-23 NOTE — Clinical Documentation Improvement (Signed)
THIS DOCUMENT IS NOT A PERMANENT PART OF THE MEDICAL RECORD  Please update your documentation with the medical record to reflect your response to this query. If you need help knowing how to do this please call [phone].  09/23/12  Dr. Benjamine Mola,  In a better effort to capture your patient's severity of illness, reflect appropriate length of stay and utilization of resources, a review of the patient medical record has revealed the following indicators -    - Admitted for HCAP   - Sputum Cx Results 09/15/12 Organism ID, Bacteria  METHICILLIN RESISTANT STAPHYLOCOCCUS AUREUS   Organism ID, Bacteria  ENTEROBACTER AEROGENES     Based on your clinical judgment, please document in the progress notes and discharge summary if the patient has been treated for a more Specific Type of Pneumonia:   - MRSA and Enterobacter Aerogenes Pneumonia   - Other Type of Pneumonia   - Unable to Clinically Determine     In responding to this query please exercise your independent judgment.    The fact that a query is asked, does not imply that any particular answer is desired or expected.   Reviewed: additional documentation in the medical record  Thank You,  Jerral Ralph  RN BSN CCDS Certified Clinical Documentation Specialist: [phone] Health Information Management [place]

## 2012-09-23 NOTE — Progress Notes (Signed)
PULMONARY  / CRITICAL CARE MEDICINE  Name: Cheryl [name] MRN: [id number] DOB: [birth year]    ADMISSION DATE:  09/14/2012 CONSULTATION DATE:  09/15/2012  REFERRING MD :  York Spaniel [facility] PRIMARY SERVICE: [facility]  CHIEF COMPLAINT:  Non-resolving pneumonia  BRIEF PATIENT DESCRIPTION: 79 y/o female was admitted on 8/26 to the [facility] service at [facility] for pneumonia unresponsive to antibiotics as an outpatient; a CT chest was concerning for an endobronchial lesion causing post obstructive pneumonia.  SIGNIFICANT EVENTS / STUDIES:  8/27 CT chest >> complete occlusion of the RML bronchus with dense RML and RLL consolidation and interlobular septal thickening; enlarge paratracheal lymph nodes 9/2 FOB>> single focus abnormal glandular tissue, unable to definitively call malignancy  LINES / TUBES:  CULTURES: 8/27 sputum >> MRSA & Enterobacter Aerogenes (sens Maxipeme) 8/27 blood >> no growth  HISTORY OF PRESENT ILLNESS:   79 y/o female was admitted on 8/26 to the [facility] service at [facility] for pneumonia unresponsive to antibiotics as an outpatient; a CT chest was concerning for an endobronchial lesion causing post obstructive pneumonia. She cannot recall the details of her illness on my visit.  She stated that she had been experiencing a strong cough for several days but did not recall whether or not she has been treated with antibiotics or the duration of the illness.  Chart review notes that she has been treated with antibiotics as an outpatient and was admitted for persistent fevers.  She underwent a hip replacement in June.  SUBJECTIVE:  Bx results back as above  VITAL SIGNS: Temp:  [98.1 F (36.7 C)-98.4 F (36.9 C)] 98.2 F (36.8 C) (09/04 1426) Pulse Rate:  [72-82] 72 (09/04 1426) Resp:  [18-19] 18 (09/04 1426) BP: (136-186)/(72-77) 186/77 mmHg (09/04 1426) SpO2:  [95 %] 95 % (09/04 1426) Weight:  [53.3 kg (117 lb 8.1 oz)] 53.3 kg (117 lb 8.1 oz) (09/04 0428) HEMODYNAMICS:   VENTILATOR SETTINGS:    INTAKE / OUTPUT: Intake/Output     09/03 0701 - 09/04 0700 09/04 0701 - 09/05 0700   P.O. 600 240   I.V. (mL/kg)     Total Intake(mL/kg) 600 (11.3) 240 (4.5)   Urine (mL/kg/hr) 950 (0.7) 500 (1.1)   Total Output 950 500   Net -350 -260          PHYSICAL EXAMINATION: Gen: elderly female, no acute distress HEENT: NCAT, PERRL, EOMi, OP clear, neck supple without masses PULM: resps even non labored, diminished RLL, few scattered ronchi R>L, clear on left CV: Irreg irreg, no mgr, no JVD AB: BS+, soft, nontender, no hsm Ext: warm, no edema, no clubbing, no cyanosis Derm: no rash or skin breakdown Neuro: Awake and alert, oriented to place, situation, but cannot give detailed answers, follows commands, maew   LABS:  CBC Recent Labs     09/21/12  0420  09/22/12  0511  09/23/12  0640  WBC  10.5  10.0  9.0  HGB  9.8*  9.9*  9.8*  HCT  27.8*  28.7*  29.3*  PLT  293  278  266   Coag's Recent Labs     09/21/12  0420  09/22/12  0511  09/23/12  0640  INR  1.63*  1.39  1.44   BMET Recent Labs     09/21/12  0420  09/23/12  0640  NA  127*  126*  K  4.1  4.4  CL  90*  92*  CO2  27  25  BUN  48*  41*  CREATININE  2.13*  2.14*  GLUCOSE  92  103*   Electrolytes Recent Labs     09/21/12  0420  09/23/12  0640  CALCIUM  8.9  9.0    CXR: RLL, RML airspace disease CT Chest w/ occlusion RML bronchus  ASSESSMENT / PLAN:  PULMONARY A: HCAP unresponsive to outpatient therapy; CT chest w RML obstruction, Endobronchial bx's with a single focus of abnormal glandular tissue but unable to call malignancy; very concerning for adenoCA.   Mild to moderate pulmonary hypertension by [facility] in 2011; WHO class 2 from diastolic CHF   P:   -agree with current antibiotics -RML concerning for malignancy but bx non-diagnostic. Will need to consider watching adn following on repeat CT scan vs repeat FOB. At this point I would favor repeat CT scan in 2-3 months and the possibly repeat  biopsies if radiographical or clinical progression.  -no indication for pulmonary vasodilators in WHO class 2 pulmonary hypertension   CARDIOVASCULAR A: diastolic CHF, currently well compensated Afib on warfarin P:  -continue metop, lasix, amlodipine per [facility]  RENAL A:  CKD at baseline Hyponatremia P:   -per [facility]   INFECTIOUS A:  HCAP P:   -agree with vanc/maxipeme, may require extended course with post-obstructive process -f/u cultures  NEUROLOGIC A:  Mild confusion P:   -monitor neuro status -minimize sedating meds   *Care during the described time interval was provided by me and/or other providers on the critical care team. I have reviewed this patient's available data, including medical history, events of note, physical examination and test results as part of my evaluation.  Levy Pupa, MD, PhD 09/23/2012, 3:36 PM [facility] [phone] or if no answer [phone]

## 2012-09-24 LAB — BASIC METABOLIC PANEL WITH GFR
BUN: 38 mg/dL — ABNORMAL HIGH (ref 6–23)
CO2: 25 meq/L (ref 19–32)
Calcium: 8.9 mg/dL (ref 8.4–10.5)
Chloride: 91 meq/L — ABNORMAL LOW (ref 96–112)
Creatinine, Ser: 2.16 mg/dL — ABNORMAL HIGH (ref 0.50–1.10)
GFR calc Af Amer: 23 mL/min — ABNORMAL LOW (ref >90)
GFR calc non Af Amer: 20 mL/min — ABNORMAL LOW (ref >90)
Glucose, Bld: 98 mg/dL (ref 70–99)
Potassium: 4.6 meq/L (ref 3.5–5.1)
Sodium: 126 meq/L — ABNORMAL LOW (ref 135–145)

## 2012-09-24 LAB — CBC
HCT: 27 % — ABNORMAL LOW (ref 36.0–46.0)
Hemoglobin: 9.5 g/dL — ABNORMAL LOW (ref 12.0–15.0)
MCH: 35.3 pg — ABNORMAL HIGH (ref 26.0–34.0)
MCHC: 35.2 g/dL (ref 30.0–36.0)
MCV: 100.4 fL — ABNORMAL HIGH (ref 78.0–100.0)
Platelets: 249 K/uL (ref 150–400)
RBC: 2.69 MIL/uL — ABNORMAL LOW (ref 3.87–5.11)
RDW: 17.5 % — ABNORMAL HIGH (ref 11.5–15.5)
WBC: 8.3 K/uL (ref 4.0–10.5)

## 2012-09-24 LAB — HEPARIN LEVEL (UNFRACTIONATED): Heparin Unfractionated: 0.13 IU/mL — ABNORMAL LOW (ref 0.30–0.70)

## 2012-09-24 MED ORDER — WARFARIN SODIUM 5 MG PO TABS
5.0000 mg | ORAL_TABLET | Freq: Once | ORAL | Status: AC
  Administered 2012-09-24: 18:00:00 5 mg via ORAL
  Filled 2012-09-24 (×2): qty 1

## 2012-09-24 NOTE — Progress Notes (Signed)
NUTRITION FOLLOW-UP  DOCUMENTATION CODES Per approved criteria  -Severe malnutrition in the context of chronic illness   INTERVENTION: Continue Ensure Complete daily, each supplement provides 350 kcal and 13 grams of protein. RD to continue to follow nutrition care plan.  NUTRITION DIAGNOSIS: Inadequate oral intake related to acute illness as evidenced by pt report and ongoing weight loss. Improving.  Goal: Intake to meet >90% of estimated nutrition needs. Met.  Monitor:  weight trends, lab trends, I/O's, PO intake, supplement tolerance  ASSESSMENT: PMHx significant for HTN, PE, ESRD non on HD. Admitted with cough x weeks, SOB and fevers. Work-up reveals PNA.  Underwent bronchoscopy on 9/2. Per MD, concern for malignancy but bx is non-diagnostic.  MBSS completed 8/29 with recommendations for Dysphagia 3 diet with thin liquids. Consuming on average 50-75% of meals. Pt states that if she doesn't eat much for her meals, she drinks the Ensure throughout the day. Encouraged her to continue to do this.  Pt meets criteria for severe MALNUTRITION in the context of chronic illness as evidenced by severe muscle mass loss, <75% of estimated energy intake x at least 1 month, and 13% wt loss x 1 month.  Height: Ht Readings from Last 1 Encounters:  09/15/12 5\' 1"  (1.549 m)    Weight: Wt Readings from Last 1 Encounters:  09/24/12 118 lb 1.6 oz (53.57 kg)  Admit wt 118 lb - stable  BMI:  Body mass index is 22.33 kg/(m^2). Weight is WNL.  Estimated Nutritional Needs: Kcal: 1250 - 1500 Protein: 40 - 50 g Fluid: 1.3 - 1.5 liters daily  Skin: intact  Diet Order: Dysphagia 3; thin liquids   Intake/Output Summary (Last 24 hours) at 09/24/12 1029 Last data filed at 09/24/12 0857  Gross per 24 hour  Intake 1041.51 ml  Output   2050 ml  Net -1008.49 ml    Last BM: 9/4  Labs:   Recent Labs Lab 09/21/12 0420 09/23/12 0640 09/24/12 0305  NA 127* 126* 126*  K 4.1 4.4 4.6  CL  90* 92* 91*  CO2 27 25 25   BUN 48* 41* 38*  CREATININE 2.13* 2.14* 2.16*  CALCIUM 8.9 9.0 8.9  GLUCOSE 92 103* 98    CBG (last 3)  No results found for this basename: GLUCAP,  in the last 72 hours  Scheduled Meds: . amLODipine  5 mg Oral Daily  . calcitRIOL  0.25 mcg Oral Daily  . ceFEPime (MAXIPIME) IV  1 g Intravenous Q24H  . cholecalciferol  800 Units Oral Daily  . feeding supplement  237 mL Oral Q24H  . furosemide  40 mg Oral Daily  . HYDROcodone-acetaminophen  1 tablet Oral q morning - 10a  . levothyroxine  75 mcg Oral Daily  . lubiprostone  24 mcg Oral BID WC  . metoprolol  100 mg Oral BID  . multivitamin with minerals  1 tablet Oral Daily  . oxybutynin  10 mg Oral Daily  . pantoprazole  40 mg Oral Daily  . polyethylene glycol  17 g Oral Daily  . sertraline  25 mg Oral Daily  . vancomycin  1,000 mg Intravenous Q48H  . Warfarin - Pharmacist Dosing Inpatient   Does not apply q1800    Continuous Infusions: . heparin Stopped (09/24/12 0149)   Jarold Motto MS, RD, LDN Pager: [phone] After-hours pager: [phone]

## 2012-09-24 NOTE — Progress Notes (Signed)
eLink Physician-Brief Progress Note Patient Name: Cheryl [name] DOB: [birth year] MRN: [id number]  Date of Service  09/24/2012   HPI/Events of Note  Patient s/p CT on heparin and coumadin for AF now with increasing bleeding at CT site.  eICU Interventions  Plan: Hold heparin for now - nurse to contact pharmacy Check CBC   Intervention Category Intermediate Interventions: Bleeding - evaluation and treatment with blood products  DETERDING,ELIZABETH 09/24/2012, 1:27 AM

## 2012-09-24 NOTE — Progress Notes (Signed)
Physical Therapy Treatment Patient Details Name: JOHNNIE MOTEN MRN: [id number] DOB: 08-03-1925 Today's Date: 09/24/2012 Time: 1478-2956 PT Time Calculation (min): 23 min  PT Assessment / Plan / Recommendation  History of Present Illness 79 y/o female was admitted on 8/26 to the [facility] service at [facility] for pneumonia unresponsive to antibiotics as an outpatient; a CT chest was concerning for an endobronchial lesion causing post obstructive pneumonia   PT Comments   Pt able to increase ambulation distance with use of RW.  Pt agreed to use RW at home for overall stability and energy conservation.     Follow Up Recommendations  Home health PT (safety evaluation)     Equipment Recommendations  None recommended by PT    Frequency Min 3X/week   Plan Current plan remains appropriate    Precautions / Restrictions Precautions Precautions: Fall Restrictions Weight Bearing Restrictions: No   Pertinent Vitals/Pain No c/o pain    Mobility  Bed Mobility Bed Mobility: Not assessed (sitting EOB when entering) Transfers Transfers: Sit to Stand;Stand to Sit Sit to Stand: 5: Supervision;With upper extremity assist;From chair/3-in-1;From toilet Stand to Sit: 5: Supervision;With upper extremity assist;To chair/3-in-1;To toilet Details for Transfer Assistance: Supervision for safety with cues for RW prior to sitting Ambulation/Gait Ambulation/Gait Assistance: 5: Supervision Ambulation Distance (Feet): 300 Feet Assistive device: Rolling walker Ambulation/Gait Assistance Details: Cues for RW placement Gait Pattern: Step-through pattern;Decreased stride length;Shuffle;Trunk flexed Stairs: No    Exercises General Exercises - Lower Extremity Long Arc Quad: Strengthening;Both;10 reps Hip Flexion/Marching: Strengthening;Both;10 reps Mini-Sqauts: Strengthening;10 reps   PT Diagnosis:    PT Problem List:   PT Treatment Interventions:     PT Goals (current goals can now be found in the care plan  section) Acute Rehab PT Goals Patient Stated Goal: home with daughter PT Goal Formulation: With patient Time For Goal Achievement: 09/30/12 Potential to Achieve Goals: Good  Visit Information  Last PT Received On: 09/24/12 Assistance Needed: +1 History of Present Illness: 79 y/o female was admitted on 8/26 to the [facility] service at [facility] for pneumonia unresponsive to antibiotics as an outpatient; a CT chest was concerning for an endobronchial lesion causing post obstructive pneumonia    Subjective Data  Subjective: I guess I can try to walk Patient Stated Goal: home with daughter   Cognition  Cognition Arousal/Alertness: Awake/alert Behavior During Therapy: WFL for tasks assessed/performed Overall Cognitive Status: Within Functional Limits for tasks assessed    End of Session PT - End of Session Equipment Utilized During Treatment: Gait belt Activity Tolerance: Patient tolerated treatment well Patient left: in bed;with call bell/phone within reach (sitting EOB waiting for bath) Nurse Communication: Mobility status   GP     Adreana Coull 09/24/2012, 8:57 AM   Jake Shark, PT DPT [phone]

## 2012-09-24 NOTE — Progress Notes (Signed)
PULMONARY  / CRITICAL CARE MEDICINE  Name: Cheryl [name] MRN: [id number] DOB: 14-May-1925    ADMISSION DATE:  09/14/2012 CONSULTATION DATE:  09/15/2012  REFERRING MD :  York Spaniel [facility] PRIMARY SERVICE: TR[facility]  CHIEF COMPLAINT:  Non-resolving pneumonia  BRIEF PATIENT DESCRIPTION: 79 y/o female was admitted on 8/26 to the [facility] service at [facility] for pneumonia unresponsive to antibiotics as an outpatient; a CT chest was concerning for an endobronchial lesion causing post obstructive pneumonia.  SIGNIFICANT EVENTS / STUDIES:  8/27 CT chest >> complete occlusion of the RML bronchus with dense RML and RLL consolidation and interlobular septal thickening; enlarge paratracheal lymph nodes 9/2 FOB>> single focus abnormal glandular tissue, unable to definitively call malignancy  LINES / TUBES:  CULTURES: 8/27 sputum >> MRSA & Enterobacter Aerogenes (sens Maxipeme) 8/27 blood >> no growth  HISTORY OF PRESENT ILLNESS:   79 y/o female was admitted on 8/26 to the [facility] service at [facility] for pneumonia unresponsive to antibiotics as an outpatient; a CT chest was concerning for an endobronchial lesion causing post obstructive pneumonia. She cannot recall the details of her illness on my visit.  She stated that she had been experiencing a strong cough for several days but did not recall whether or not she has been treated with antibiotics or the duration of the illness.  Chart review notes that she has been treated with antibiotics as an outpatient and was admitted for persistent fevers.  She underwent a hip replacement in June.  SUBJECTIVE:  More hemoptysis last night so heparin held. Improved this am Indeterminate FOB bx's as above  VITAL SIGNS: Temp:  [98.2 F (36.8 C)-98.7 F (37.1 C)] 98.4 F (36.9 C) (09/05 0450) Pulse Rate:  [72-82] 82 (09/05 0450) Resp:  [18] 18 (09/05 0450) BP: (123-186)/(56-77) 134/56 mmHg (09/05 0450) SpO2:  [90 %-95 %] 90 % (09/05 0450) Weight:  [53.57 kg (118 lb 1.6 oz)] 53.57 kg  (118 lb 1.6 oz) (09/05 0450) HEMODYNAMICS:   VENTILATOR SETTINGS:   INTAKE / OUTPUT: Intake/Output     09/04 0701 - 09/05 0700 09/05 0701 - 09/06 0700   P.O. 480 360   I.V. (mL/kg) 441.5 (8.2)    Total Intake(mL/kg) 921.5 (17.2) 360 (6.7)   Urine (mL/kg/hr) 2350 (1.8)    Total Output 2350     Net -1428.5 +360          PHYSICAL EXAMINATION: Gen: elderly female, no acute distress HEENT: NCAT, PERRL, EOMi, OP clear, neck supple without masses PULM: resps even non labored, diminished RLL, few scattered ronchi R>L, clear on left CV: Irreg irreg, no mgr, no JVD AB: BS+, soft, nontender, no hsm Ext: warm, no edema, no clubbing, no cyanosis Derm: no rash or skin breakdown Neuro: Awake and alert, oriented to place, situation, but cannot give detailed answers, follows commands, maew   LABS:  CBC Recent Labs     09/22/12  0511  09/23/12  0640  09/24/12  0305  WBC  10.0  9.0  8.3  HGB  9.9*  9.8*  9.5*  HCT  28.7*  29.3*  27.0*  PLT  278  266  249   Coag's Recent Labs     09/22/12  0511  09/23/12  0640  09/24/12  0305  INR  1.39  1.44  1.60*   BMET Recent Labs     09/23/12  0640  09/24/12  0305  NA  126*  126*  K  4.4  4.6  CL  92*  91*  CO2  25  25  BUN  41*  38*  CREATININE  2.14*  2.16*  GLUCOSE  103*  98   Electrolytes Recent Labs     09/23/12  0640  09/24/12  0305  CALCIUM  9.0  8.9    CXR: RLL, RML airspace disease CT Chest w/ occlusion RML bronchus  ASSESSMENT / PLAN:  PULMONARY A: HCAP unresponsive to outpatient therapy; CT chest w RML obstruction, Endobronchial bx's with a single focus of abnormal glandular tissue but unable to call malignancy; concerning for adenoCA.   Mild to moderate pulmonary hypertension by [facility] in 2011; WHO class 2 from diastolic CHF   P:   -agree with current antibiotics -RML concerning for malignancy but bx non-diagnostic. Given her hemoptysis and overall status, I favor watching and performing repeat CT scan in  2-3 months and the possibly repeat biopsies if radiographical or clinical progression. Explained the options to her 9/5. She can f/u with [facility] outpt, needs CT chest without contrast in November before that appointment -no indication for pulmonary vasodilators in WHO class 2 pulmonary hypertension   CARDIOVASCULAR A: diastolic CHF, currently well compensated Afib on warfarin P:  -continue metop, lasix, amlodipine per TRH  RENAL A:  CKD at baseline Hyponatremia P:   -per TRH   INFECTIOUS A:  HCAP P:   -agree with vanc/maxipeme, may require extended course with post-obstructive process -f/u cultures  NEUROLOGIC A:  Mild confusion P:   -monitor neuro status -minimize sedating meds   *Care during the described time interval was provided by me and/or other providers on the critical care team. I have reviewed this patient's available data, including medical history, events of note, physical examination and test results as part of my evaluation.  Levy Pupa, MD, PhD 09/24/2012, 9:39 AM [facility] [phone] or if no answer [phone]

## 2012-09-24 NOTE — Progress Notes (Signed)
Speech Language Pathology Dysphagia Treatment Patient Details Name: Cheryl [name] MRN: [id number] DOB: [birth year] Today's Date: 09/24/2012 Time: 7829-5621 SLP Time Calculation (min): 20 min  Assessment / Plan / Recommendation Clinical Impression  Pt observed with Dys. 3 textures and thin liquids with wet vocal quality x1 which returned to baseline with a reflexive throat clear. Based on MBS, it is likely that strong throat clear was effective at clearing possible penetrates. SLP provided further education regarding safe swallowing, risks of aspiration, and swallowing strategies. Pt's swallow per MBS is WFL for pt's age and her throat clear effectively clears penetrates. No further speech services needed at this time. Please re-order as needed.    Diet Recommendation  Continue with Current Diet: Dysphagia 3 (mechanical soft);Thin liquid    SLP Plan All goals met   Pertinent Vitals/Pain N/A   Swallowing Goals  SLP Swallowing Goals Patient will consume recommended diet without observed clinical signs of aspiration with: Set-up Swallow Study Goal #1 - Progress: Met Patient will utilize recommended strategies during swallow to increase swallowing safety with: Set-up Swallow Study Goal #2 - Progress: Met  General Temperature Spikes Noted: No Behavior/Cognition: Alert;Cooperative;Pleasant mood;Hard of hearing Oral Cavity - Dentition: Dentures, bottom;Dentures, top Patient Positioning: Upright in bed  Oral Cavity - Oral Hygiene Does patient have any of the following "at risk" factors?: None of the above Brush patient's teeth BID with toothbrush (using toothpaste with fluoride): Yes   Dysphagia Treatment Treatment focused on: Skilled observation of diet tolerance;Patient/family/caregiver education;Utilization of compensatory strategies Treatment Methods/Modalities: Skilled observation;Differential diagnosis Patient observed directly with PO's: Yes Type of PO's observed: Dysphagia 3  (soft);Thin liquids Feeding: Able to feed self Liquids provided via: Cup;No straw Pharyngeal Phase Signs & Symptoms: Wet vocal quality;Delayed throat clear   GO     Maxcine Ham 09/24/2012, 12:20 PM  Maxcine Ham, M.A. CCC-SLP [phone]

## 2012-09-24 NOTE — Progress Notes (Signed)
ANTICOAGULATION/ANTIBIOTIC CONSULT NOTE - Follow Up Consult  Pharmacy Consult for coumadin Indication: Afib  Labs:  Recent Labs  09/22/12 0511 09/22/12 1456 09/23/12 0640 09/24/12 0305  HGB 9.9*  --  9.8* 9.5*  HCT 28.7*  --  29.3* 27.0*  PLT 278  --  266 249  LABPROT 16.7*  --  17.2* 18.6*  INR 1.39  --  1.44 1.60*  HEPARINUNFRC <0.10* 0.52 0.69 0.13*  CREATININE  --   --  2.14* 2.16*  VANC TR      15.7  Estimated Creatinine Clearance: 14.4 ml/min (by C-G formula based on Cr of 2.16).   Medications:  Scheduled:  . amLODipine  5 mg Oral Daily  . calcitRIOL  0.25 mcg Oral Daily  . ceFEPime (MAXIPIME) IV  1 g Intravenous Q24H  . cholecalciferol  800 Units Oral Daily  . feeding supplement  237 mL Oral Q24H  . furosemide  40 mg Oral Daily  . HYDROcodone-acetaminophen  1 tablet Oral q morning - 10a  . levothyroxine  75 mcg Oral Daily  . lubiprostone  24 mcg Oral BID WC  . metoprolol  100 mg Oral BID  . multivitamin with minerals  1 tablet Oral Daily  . oxybutynin  10 mg Oral Daily  . pantoprazole  40 mg Oral Daily  . polyethylene glycol  17 g Oral Daily  . sertraline  25 mg Oral Daily  . vancomycin  1,000 mg Intravenous Q48H  . Warfarin - Pharmacist Dosing Inpatient   Does not apply q1800    Assessment: 79yo female has been on heparin bridge to coumadin for Afib, heparin was stopped last night d/t hemoptysis. INR 1.6, trending up, hgb and plts are stable, Hemoptysis has been improved since heparin was stopped.   Coumadin PTA dose: 4mg  on Monday and Fridays, 3mg  all other days  Goal of Therapy:  INR 2-3   Plan:  - Coumadin 5mg  po x 1 this evening - f/u AM INR  Bayard Hugger, PharmD, BCPS  Clinical Pharmacist  Pager: [phone]   09/24/2012,10:33 AM

## 2012-09-24 NOTE — Progress Notes (Signed)
[facility] PROGRESS NOTE  Cheryl [name] ZOX:[id number] DOB: 05-May-1925 DOA: 09/14/2012 PCP: Abigail Miyamoto, MD  Assessment/Plan: - MRSA and Enterobacter Aerogenes Pneumonia/ post obstructive pneumonia, SIRS; CT chest R lung bronch obstruction midd/lowr lobe;  -s/p bronchoscopy broch and Bx RML broch; IV heparin per pharmacy; no signs of bleeding, so will start coumadin -Patient was empirically on zosyn and vancomycin (8/27) pharm to dose, but c/s showed +MRSA and ENTEROBACTER AEROGENES; atx changed to cefepime+vanc based on sensitivity (8/30)  -diet changed per speech therapy eval  - will change to doxy 100 mg BID for 4 more days and levofl x 4 days [name] - HR controlled; off coumadin for procedure; pharm to dose heparin, cardiac monitor, continue home rate control meds- restart coumadin  Hyponatremic - chronic and baseline; likely lung related/SIADH;   ESRD - technically appears to be CKD stage 4 with GFR of about 20, this appears to be her baseline going back for several months. She does have a dialysis graft in her L arm which was put in Oct of last year, it has a strong pulsatile thrill, this graft has not been used for dialysis yet per patient. Continue home meds, monitor BMP while here but does not appear to be an acute issue at this time. -resumed lasix on 9/1 at 40 mg; Was on hold for Low BP   HTN; 8/29 BP was on low; decrease amlodipine from 5-->2.5 (8/29); lasix was on hold (home dose 40 mg); resume 9/1; cont BB; monitor   Hypothyroidism; on levothyroxine  PT eval rec home health- plan is for patient to go home with daughter -check O2 sats while ambulating  Code Status: full Family Communication:  patient Disposition Plan:    Consultants:  pulm  Procedures:    Antibiotics:    HPI/Subjective: Feeling better, wants to go home  Objective: Filed Vitals:   09/24/12 0450  BP: 134/56  Pulse: 82  Temp: 98.4 F (36.9 C)  Resp: 18     Intake/Output Summary (Last 24 hours) at 09/24/12 0911 Last data filed at 09/24/12 0857  Gross per 24 hour  Intake 1041.51 ml  Output   2050 ml  Net -1008.49 ml   Filed Weights   09/22/12 0640 09/23/12 0428 09/24/12 0450  Weight: 52.572 kg (115 lb 14.4 oz) 53.3 kg (117 lb 8.1 oz) 53.57 kg (118 lb 1.6 oz)    Exam:  General: Alert awake oriented; HOH  Cardiovascular: S1, S2  Respiratory: mild rhonchi  Abdomen: soft, ND, ND  Musculoskeletal: non tender    Data Reviewed: Basic Metabolic Panel:  Recent Labs Lab 09/18/12 0449 09/19/12 0445 09/21/12 0420 09/23/12 0640 09/24/12 0305  NA 127* 127* 127* 126* 126*  K 3.5 3.9 4.1 4.4 4.6  CL 87* 88* 90* 92* 91*  CO2 26 28 27 25 25   GLUCOSE 117* 114* 92 103* 98  BUN 62* 57* 48* 41* 38*  CREATININE 2.79* 2.56* 2.13* 2.14* 2.16*  CALCIUM 8.8 9.0 8.9 9.0 8.9   Liver Function Tests: No results found for this basename: AST, ALT, ALKPHOS, BILITOT, PROT, ALBUMIN,  in the last 168 hours No results found for this basename: LIPASE, AMYLASE,  in the last 168 hours No results found for this basename: AMMONIA,  in the last 168 hours CBC:  Recent Labs Lab 09/19/12 0445 09/21/12 0420 09/22/12 0511 09/23/12 0640 09/24/12 0305  WBC 13.0* 10.5 10.0 9.0 8.3  HGB 10.6* 9.8* 9.9* 9.8* 9.5*  HCT 29.9* 27.8* 28.7* 29.3* 27.0*  MCV  99.3 99.3 100.7* 101.7* 100.4*  PLT 304 293 278 266 249   Cardiac Enzymes: No results found for this basename: CKTOTAL, CKMB, CKMBINDEX, TROPONINI,  in the last 168 hours BNP (last 3 results) No results found for this basename: PROBNP,  in the last 8760 hours CBG: No results found for this basename: GLUCAP,  in the last 168 hours  Recent Results (from the past 240 hour(s))  CULTURE, BLOOD (ROUTINE X 2)     Status: None   Collection Time    09/15/12 12:20 AM      Result Value Range Status   Specimen Description BLOOD RIGHT ARM   Final   Special Requests     Final   Value: BOTTLES DRAWN AEROBIC AND  ANAEROBIC 10CC BLUE 8CC RED   Culture  Setup Time     Final   Value: 09/15/2012 11:06     Performed at [facility]   Culture     Final   Value: NO GROWTH 5 DAYS     Performed at [facility]   Report Status 09/21/2012 FINAL   Final  CULTURE, BLOOD (ROUTINE X 2)     Status: None   Collection Time    09/15/12 12:30 AM      Result Value Range Status   Specimen Description BLOOD RIGHT WRIST   Final   Special Requests BOTTLES DRAWN AEROBIC ONLY [facility]   Final   Culture  Setup Time     Final   Value: 09/15/2012 11:06     Performed at [facility]   Culture     Final   Value: NO GROWTH 5 DAYS     Performed at [facility]   Report Status 09/21/2012 FINAL   Final  CULTURE, EXPECTORATED SPUTUM-ASSESSMENT     Status: None   Collection Time    09/15/12  9:11 AM      Result Value Range Status   Specimen Description SPUTUM   Final   Special Requests NONE   Final   Sputum evaluation     Final   Value: THIS SPECIMEN IS ACCEPTABLE. RESPIRATORY CULTURE REPORT TO FOLLOW.   Report Status 09/15/2012 FINAL   Final  CULTURE, RESPIRATORY (NON-EXPECTORATED)     Status: None   Collection Time    09/15/12  9:11 AM      Result Value Range Status   Specimen Description SPUTUM   Final   Special Requests NONE   Final   Gram Stain     Final   Value: ABUNDANT WBC PRESENT,BOTH PMN AND MONONUCLEAR     NO SQUAMOUS EPITHELIAL CELLS SEEN     MODERATE GRAM POSITIVE COCCI     IN PAIRS IN CLUSTERS RARE GRAM NEGATIVE RODS     RARE YEAST   Culture     Final   Value: ABUNDANT METHICILLIN RESISTANT STAPHYLOCOCCUS AUREUS     Note: RIFAMPIN AND GENTAMICIN SHOULD NOT BE USED AS SINGLE DRUGS FOR TREATMENT OF STAPH INFECTIONS. CRITICAL RESULT CALLED TO, READ BACK BY AND VERIFIED WITH: PEGGY GERHARD RN 10AM 09/18/12 GUSTK     ABUNDANT ENTEROBACTER AEROGENES     Performed at [facility]   Report Status 09/18/2012 FINAL   Final   Organism ID, Bacteria METHICILLIN RESISTANT  STAPHYLOCOCCUS AUREUS   Final   Organism ID, Bacteria ENTEROBACTER AEROGENES   Final  GRAM STAIN     Status: None   Collection Time    09/15/12  9:12 AM  Result Value Range Status   Specimen Description SPUTUM   Final   Special Requests NONE   Final   Gram Stain     Final   Value: ABUNDANT WBC PRESENT,BOTH PMN AND MONONUCLEAR     MODERATE GRAM POSITIVE COCCI IN PAIRS IN CLUSTERS     RARE GRAM NEGATIVE RODS     RARE YEAST   Report Status 09/15/2012 FINAL   Final     Studies: No results found.  Scheduled Meds: . amLODipine  5 mg Oral Daily  . calcitRIOL  0.25 mcg Oral Daily  . ceFEPime (MAXIPIME) IV  1 g Intravenous Q24H  . cholecalciferol  800 Units Oral Daily  . feeding supplement  237 mL Oral Q24H  . furosemide  40 mg Oral Daily  . HYDROcodone-acetaminophen  1 tablet Oral q morning - 10a  . levothyroxine  75 mcg Oral Daily  . lubiprostone  24 mcg Oral BID WC  . metoprolol  100 mg Oral BID  . multivitamin with minerals  1 tablet Oral Daily  . oxybutynin  10 mg Oral Daily  . pantoprazole  40 mg Oral Daily  . polyethylene glycol  17 g Oral Daily  . sertraline  25 mg Oral Daily  . vancomycin  1,000 mg Intravenous Q48H  . Warfarin - Pharmacist Dosing Inpatient   Does not apply q1800   Continuous Infusions: . heparin Stopped (09/24/12 0149)    Principal Problem:   HCAP (healthcare-associated pneumonia) Active Problems:   Atrial fibrillation   Hyponatremia   ESRD (end stage renal disease)   Lung nodule    Time spent: 35    [facility], [place]  [facility] Pager [phone]. If 7PM-7AM, please contact night-coverage at [email or web address], password [facility] 09/24/2012, 9:11 AM  LOS: 10 days

## 2012-09-24 NOTE — Progress Notes (Signed)
Pt with increasing hemoptysis. Sputum has turned from pink-tinged during day shift to now bloody. NP on call Lenny Pastel) made aware. Dr. Darrick Penna of [facility] made aware upon recommendation from on-call NP. Dr. Darrick Penna instructed to put heparin drip on hold and make pharmacy aware. On-call NP also made aware. Will continue to monitor.

## 2012-09-24 NOTE — Progress Notes (Signed)
09/24/12 1506 Pt. may need home continuous oxygen.  Staff to ambulate pt. to check sats.  Pt. set up with [facility] for [facility] PT/RN.  Tera Mater, RN, BSN NCM [phone]

## 2012-09-24 NOTE — Progress Notes (Signed)
SATURATION QUALIFICATIONS: (This note is used to comply with regulatory documentation for home oxygen)  Patient Saturations on Room Air at Rest 91%  Patient Saturations on Room Air while Ambulating 88%  Patient Saturations on  2Liters of oxygen while Ambulating 90  Please briefly explain why patient needs home oxygen  Pt "s oxygen saturation falls to 88% on ambulation  And recovered to 90's on 2 l/m Cheryl [name]

## 2012-09-25 LAB — CBC
MCV: 100 fL (ref 78.0–100.0)
Platelets: 257 10*3/uL (ref 150–400)
RDW: 17.4 % — ABNORMAL HIGH (ref 11.5–15.5)
WBC: 7.2 10*3/uL (ref 4.0–10.5)

## 2012-09-25 LAB — PROTIME-INR
INR: 2.32 — ABNORMAL HIGH (ref 0.00–1.49)
Prothrombin Time: 24.7 seconds — ABNORMAL HIGH (ref 11.6–15.2)

## 2012-09-25 MED ORDER — LEVOFLOXACIN 500 MG PO TABS
500.0000 mg | ORAL_TABLET | Freq: Every day | ORAL | Status: DC
  Filled 2012-09-25: qty 1

## 2012-09-25 MED ORDER — ENSURE COMPLETE PO LIQD: 237.0000 mL | ORAL | Status: AC

## 2012-09-25 MED ORDER — ADULT MULTIVITAMIN W/MINERALS CH: 1.0000 | ORAL_TABLET | Freq: Every day | ORAL | Status: DC

## 2012-09-25 MED ORDER — LEVOFLOXACIN 500 MG PO TABS: 500.0000 mg | ORAL_TABLET | Freq: Every day | ORAL | Status: DC

## 2012-09-25 MED ORDER — DOXYCYCLINE HYCLATE 100 MG PO TABS: 100.0000 mg | ORAL_TABLET | Freq: Two times a day (BID) | ORAL | Status: DC

## 2012-09-25 MED ORDER — LEVOFLOXACIN 250 MG PO TABS: 250.0000 mg | ORAL_TABLET | Freq: Every day | ORAL | Status: DC

## 2012-09-25 MED ORDER — DOXYCYCLINE HYCLATE 100 MG PO TABS
100.0000 mg | ORAL_TABLET | Freq: Two times a day (BID) | ORAL | Status: DC
Start: 2012-09-25 — End: 2012-09-25
  Filled 2012-09-25 (×2): qty 1

## 2012-09-25 NOTE — Significant Event (Signed)
SATURATION QUALIFICATIONS: (This note is used to comply with regulatory documentation for home oxygen)  Patient Saturations on Room Air at Rest = 94%  Patient Saturations on Room Air while Ambulating = 93%  Patient Saturations on 0 Liters of oxygen while Ambulating = 93%  Please briefly explain why patient needs home oxygen:n/a

## 2012-09-25 NOTE — Progress Notes (Signed)
   CARE MANAGEMENT NOTE 09/25/2012  Patient:  Cheryl [name], Cheryl [name]   Account Number:  [id number]  Date Initiated:  09/15/2012  Documentation initiated by:  Tera Mater  Subjective/Objective Assessment:   79yo female admitted with HCAP.  Pt. lives at home with daughter.     Action/Plan:   discharge planning   Anticipated DC Date:  09/17/2012   Anticipated DC Plan:  HOME W HOME HEALTH SERVICES      DC Planning Services  CM consult      [facility] Choice  Resumption Of Svcs/PTA Provider   Choice offered to / List presented to:          [facility] arranged  HH-1 RN  HH-2 PT      [facility] agency  [facility]   Status of service:  Completed, signed off Medicare Important Message given?   (If response is "NO", the following Medicare IM given date fields will be blank) Date Medicare IM given:   Date Additional Medicare IM given:    Discharge Disposition:    Per UR Regulation:  Reviewed for med. necessity/level of care/duration of stay  If discussed at Long Length of Stay Meetings, dates discussed:   09/16/2012  09/23/2012    Comments:  09/25/12 13:00 Spoke with pt in room to verify address and phone numbers.  Pt will be going home with daughter, Cheryl [name], [phone] or [phone].  [facility] notified of discharge and new address.  No other CM needs were communicated.  Freddy Jaksch, BSN, [phone].   09/24/12 1506 Pt. may need home continuous oxygen.  Staff to ambulate pt. to check sats.  Pt. set up with [facility] for [facility] PT/RN. Tera Mater, RN, BSN [place] [phone]    09/22/12 1415 In to speak with pt. about discharge planning. Pt. is awaiting results from bronchoscopy.  Pt. would benefit from PT to assist with disposition. Tera Mater, RN, BSN NCM [phone]    09/15/12 1545 Pt. is currently being seen by [facility] for [facility] PT/RN.  Will obtain resumption of care orders. NCM will follow for further dc needs. Tera Mater, RN, BSN NCM [phone]

## 2012-09-25 NOTE — Progress Notes (Signed)
ANTICOAGULATION/ANTIBIOTIC CONSULT NOTE - Follow Up Consult  Pharmacy Consult for coumadin Indication: Afib  Labs:  Recent Labs  09/22/12 1456  09/23/12 0640 09/24/12 0305 09/25/12 0350  HGB  --   < > 9.8* 9.5* 9.7*  HCT  --   --  29.3* 27.0* 28.1*  PLT  --   --  266 249 257  LABPROT  --   --  17.2* 18.6* 24.7*  INR  --   --  1.44 1.60* 2.32*  HEPARINUNFRC 0.52  --  0.69 0.13*  --   CREATININE  --   --  2.14* 2.16*  --   < > = values in this interval not displayed.VANC TR      15.7  Estimated Creatinine Clearance: 14.4 ml/min (by C-G formula based on Cr of 2.16).   Medications:  Scheduled:  . amLODipine  5 mg Oral Daily  . calcitRIOL  0.25 mcg Oral Daily  . cholecalciferol  800 Units Oral Daily  . doxycycline  100 mg Oral Q12H  . feeding supplement  237 mL Oral Q24H  . furosemide  40 mg Oral Daily  . HYDROcodone-acetaminophen  1 tablet Oral q morning - 10a  . [START ON 09/26/2012] levofloxacin  250 mg Oral Daily  . levothyroxine  75 mcg Oral Daily  . metoprolol  100 mg Oral BID  . [START ON 09/26/2012] multivitamin with minerals  1 tablet Oral Q1200  . oxybutynin  10 mg Oral Daily  . pantoprazole  40 mg Oral Daily  . polyethylene glycol  17 g Oral Daily  . sertraline  25 mg Oral Daily  . Warfarin - Pharmacist Dosing Inpatient   Does not apply q1800    Assessment: 79yo female had been on heparin bridge to coumadin for Afib, heparin was stopped last night d/t hemoptysis. INR 1.4> 1.6> 2.3 on higher than home doses of Couamdin.  Hgb and plts are stable, Hemoptysis has been improved since heparin was stopped.  Plan to d/c home on PTA Coumadin dose. Coumadin PTA dose: 4mg  on Monday and Fridays, 3mg  all other days  Goal of Therapy:  INR 2-3   Plan:  Restart home dosing at d/c  [employer] Pharm.D. CPP, BCPS Clinical Pharmacist [phone] 09/25/2012 11:49 AM

## 2012-09-25 NOTE — Consult Note (Signed)
Spoke to patient nurse Inetta Fermo and she told me that patient does not need portable tank.

## 2012-09-25 NOTE — Discharge Summary (Signed)
Physician Discharge Summary  Cheryl [name] JYN:[id number] DOB: August 28, 1925 DOA: 09/14/2012  PCP: Abigail Miyamoto, MD  Admit date: 09/14/2012 Discharge date: 09/25/2012  Time spent: 35 minutes  Recommendations for Outpatient Follow-up:  1. PT/INR on Monday- on abx 2. CT scan in Nov with follow up by pulm 3. Home health- did not qualify for O2   Discharge Diagnoses:  Principal Problem:   HCAP (healthcare-associated pneumonia) Active Problems:   Atrial fibrillation   Hyponatremia   ESRD (end stage renal disease)   Lung nodule   Discharge Condition: improved  Diet recommendation: cardiac  Filed Weights   09/23/12 0428 09/24/12 0450 09/25/12 0533  Weight: 53.3 kg (117 lb 8.1 oz) 53.57 kg (118 lb 1.6 oz) 52.309 kg (115 lb 5.1 oz)    History of present illness:  Cheryl [name] is a 79 y.o. female who presents with a history of productive cough for the past several weeks. She has been on ABx as an outpatient for suspected PNA. She finished a 10 day course of this as well as steroids several days ago but continues to have cough, occasional shortness of breath, and ongoing fevers. She presents to the ED today with fever of 101.2, WBC was 26.2k, and her CXR demonstrates a RML infiltrate c/w PNA. Her Creatinine of 2.09 and sodium of 124 appear to be baseline. Hospitalist has been asked to admit for HCAP (PNA felt to be HCAP since she had an admission in June for hip fracture) failed outpatient treatment.   Hospital Course:   MRSA and Enterobacter Aerogenes Pneumonia/ post obstructive pneumonia, SIRS; CT chest R lung bronch obstruction midd/lowr lobe;  -s/p bronchoscopy broch and Bx RML broch;  -Patient was empirically on zosyn and vancomycin (8/27) pharm to dose, but c/s showed +MRSA and ENTEROBACTER AEROGENES; atx changed to cefepime+vanc based on sensitivity (8/30)  -diet changed per speech therapy eval  - will change to doxy 100 mg BID for 4 more days and levofl x 4 days at  discharge  [name] - HR controlled;  coumadin   Hyponatremic - chronic and baseline; likely lung related/SIADH;   ESRD - monitor  HTN; monitor  Hypothyroidism; on levothyroxine  Procedures:  bronch  Consultations:  pulm  Discharge Exam: Filed Vitals:   09/25/12 0533  BP: 133/65  Pulse: 69  Temp: 97 F (36.1 C)  Resp: 18    General: pleasant/cooperative Cardiovascular: rrr Respiratory: clear  Discharge Instructions      Discharge Orders   Future Orders Complete By Expires   Diet - low sodium heart healthy  As directed    Discharge instructions  As directed    Comments:     Home health- PT, RN O2 CT chest without contrast in November  INR on Monday (taking antibiotics so ned close monitoring)   Increase activity slowly  As directed        Medication List         amLODipine 5 MG tablet  Commonly known as:  NORVASC  Take 5 mg by mouth daily.     calcitRIOL 0.25 MCG capsule  Commonly known as:  ROCALTROL  Take 0.25 mcg by mouth daily.     doxycycline 100 MG tablet  Commonly known as:  VIBRA-TABS  Take 1 tablet (100 mg total) by mouth every 12 (twelve) hours.     feeding supplement Liqd  Take 237 mLs by mouth daily.     furosemide 40 MG tablet  Commonly known as:  LASIX  Take 40  mg by mouth daily.     guaifenesin 100 MG/5ML syrup  Commonly known as:  ROBITUSSIN  Take 200 mg by mouth 3 (three) times daily as needed for cough.     HYDROcodone-acetaminophen 5-325 MG per tablet  Commonly known as:  NORCO/VICODIN  Take 1 tablet by mouth every morning.     levofloxacin 250 MG tablet  Commonly known as:  LEVAQUIN  Take 1 tablet (250 mg total) by mouth daily.  Start taking on:  09/26/2012     levothyroxine 75 MCG tablet  Commonly known as:  SYNTHROID, LEVOTHROID  Take 75 mcg by mouth daily.     lubiprostone 24 MCG capsule  Commonly known as:  AMITIZA  Take 1 capsule (24 mcg total) by mouth 2 (two) times daily with a meal.     Melatonin 5 MG  Tabs  Take 5 mg by mouth at bedtime.     metoprolol 100 MG tablet  Commonly known as:  LOPRESSOR  Take 1 tablet (100 mg total) by mouth 2 (two) times daily.     multivitamin with minerals Tabs tablet  Take 1 tablet by mouth daily. Centrum Silver     omeprazole 20 MG capsule  Commonly known as:  PRILOSEC  Take 20 mg by mouth daily.     oxybutynin 10 MG 24 hr tablet  Commonly known as:  DITROPAN-XL  Take 10 mg by mouth daily.     polyethylene glycol packet  Commonly known as:  MIRALAX / GLYCOLAX  Take 17 g by mouth daily.     sertraline 25 MG tablet  Commonly known as:  ZOLOFT  Take 25 mg by mouth daily.     Vitamin D 400 UNITS capsule  Take 800 Units by mouth daily.     warfarin 3 MG tablet  Commonly known as:  COUMADIN  Take 3 mg by mouth See admin instructions. Take 1 tablet on Sun, Tues, Wed, Thurs, Sat     warfarin 4 MG tablet  Commonly known as:  COUMADIN  Take 4 mg by mouth 2 (two) times a week. Take 1 tablet on Mon and Fri       Allergies  Allergen Reactions  . Codeine Other (See Comments)    unknown  . Sulfa Antibiotics     unknown   Follow-up Information   Follow up with [facility]. (home health physical therapy, and nurse)    Contact information:   [phone]      Follow up with Abigail Miyamoto, MD In 1 week. (INR on monday)    Specialty:  Family Medicine   Contact information:   [address] [place] [address] [phone]       Follow up with Leslye Peer., MD. (november after CT Scan)    Specialty:  Pulmonary Disease   Contact information:   [address] [place] [address] [phone]        The results of significant diagnostics from this hospitalization (including imaging, microbiology, ancillary and laboratory) are listed below for reference.    Significant Diagnostic Studies: Dg Chest 2 View  09/14/2012   *RADIOLOGY REPORT*  Clinical Data: Cough, fever.  CHEST - 2 VIEW  Comparison: 06/28/2012   Findings: Right middle lobe consolidation. Background interstitial coarsening and hyperinflation. There may be trace fluid along the right fissure.  Otherwise, no pleural effusion or pneumothorax. The Aortic tortuosity atherosclerosis without dilatation.  Upper normal heart size.  Osteopenia without acute osseous finding.  IMPRESSION: Right  middle lobe pneumonia.  Recommend radiograph follow-up to resolution.  COPD.   Original Report Authenticated By: Jearld Lesch, M.D.   Ct Chest Wo Contrast  09/15/2012   *RADIOLOGY REPORT*  Clinical Data: Chronic pneumonia  CT CHEST WITHOUT CONTRAST  Technique:  Multidetector CT imaging of the chest was performed following the standard protocol without IV contrast.  Comparison: 09/14/2012.  Findings: Dense airspace consolidation involves the right middle lobe.  There is near complete occlusion of the proximal right middle lobe bronchus, image 31/series 3.Pain there is no airspace consolidation in the are identified within the right lower lobe. There is evidence of right middle lobe and right lower lobe bronchiectasis and bronchial wall thickening.  There is prominence of the pulmonary interstitium within the right lower lobe with interlobular septal thickening.  The left lung is relatively clear.  Normal heart size.  No pericardial effusion.  Enlarged mediastinal lymph nodes are identified.  Higher right paratracheal lymph node measures 1.2 cm, image 17/series 2.  There is a lower right paratracheal lymph node measuring 1.3 cm, image 22/series 2.  The no left hilar adenopathy.  There is no supraclavicular or axillary adenopathy.  Limited imaging through the upper abdomen shows no acute findings.  Bones appear mildly osteopenic and there is multilevel spondylosis within the thoracic spine.  IMPRESSION:  1.  Dense airspace consolidation involves the right middle lobe and there is patchy airspace disease involving the right lower lobe. This is a nonspecific finding and may  reflect multifocal infection, sequelae of aspiration or tumor. 2.  There is complete occlusion of the right middle lobe bronchus and partial occlusion of the right lower lobe bronchus.  Findings may reflect aspiration, mucous plugging or intraluminal tumor. Advise further evaluation with bronchoscopy. 3.  Interlobular septal thickening within the right lower lobe is nonspecific but can be seen with lymphangitic spread of tumor. 4.  Enlarged right paratracheal lymph nodes are nonspecific. In the setting of infection these may be reactive in etiology. Differential considerations include metastatic adenopathy, granulomatous disease or lymphoma.   Original Report Authenticated By: Signa Kell, M.D.   Dg Chest Port 1 View  09/21/2012   *RADIOLOGY REPORT*  Clinical Data: Cough and congestion.  PORTABLE CHEST - 1 VIEW  Comparison: PA and lateral chest 09/14/2012 and CT chest 09/15/2012.  Findings: Right middle and lower lobe airspace disease has worsened.  Streaky opacity is identified in the left lung base. There is cardiomegaly.  No pneumothorax identified.  There is likely a small right pleural effusion.  IMPRESSION: Worsened right middle and lower lobe airspace disease.  New streaky opacity is also seen in the left lung base which could be atelectasis or pneumonia.   Original Report Authenticated By: Holley Dexter, M.D.   Dg Swallowing Func-speech Pathology  09/17/2012   Gray Bernhardt, CCC-SLP     09/17/2012  3:04 PM Objective Swallowing Evaluation: Modified Barium Swallowing Study   Patient Details  Name: SARANNE CRISLIP MRN: [id number] Date of Birth: [birth year]  Today's Date: 09/17/2012 Time: 1335-1400 SLP Time Calculation (min): 25 min  Past Medical History:  Past Medical History  Diagnosis Date  . HTN (hypertension)     2 YEARS  . Hypothyroidism   . Temporal arteritis   . Pulmonary embolism 1997  . Mitral regurgitation     a. mild by 01/2010 echo.  . Chronic diastolic CHF (congestive heart failure)     a.  01/2010 Echo: EF 60%, mild LVH, High LV filling pressures, No  RWMA, mild MR, PASP .  Arman Bogus   . Chronic anemia   . Paroxysmal atrial fibrillation   . Pulmonary HTN     a. PASP by 01/2010 echo.  . S/P cardiac cath     a. 12/2009 R Heart Cath:  RA 12, RV 45/11, PA 47/21, PCWP MEAN  26, CO 4.56, CI 2.7.  . Ischemia     a. 12/2009 Myoview: Ant ischemia, EF 75%----NO CATH DONE DUE TO  CKD  . End stage renal disease     a. s/p AVF - not on dialysis.  [name] History of urinary frequency   . GERD (gastroesophageal reflux disease)   . Sleep trouble   . Constipation, chronic   . Hard of hearing   . Complication of anesthesia   . PONV (postoperative nausea and vomiting)   . Pneumonia 09/15/2012   Past Surgical History:  Past Surgical History  Procedure Laterality Date  . Temporal arteny bx    . Cataract surgery    . Eye surgery    . Abdominal hysterectomy    . Intramedullary (im) nail intertrochanteric Left 06/30/2012    Procedure: INTRAMEDULLARY (IM) NAIL INTERTROCHANTRIC LEFT;   Surgeon: Harvie Junior, MD;  Location: [facility] OR;  Service:  Orthopedics;  Laterality: Left;  . Injection knee Left 06/30/2012    Procedure: Left Knee Aspiration with Intra-Articular Injection;   Surgeon: Harvie Junior, MD;  Location: [facility] OR;  Service:  Orthopedics;  Laterality: Left;   HPI:  79 year old female admitted 09/14/12 with RML PNA, patchy airspace  disease of RLL. PMH significant for COPD, GERD, hip fx June 2014.  BSE ordered to assess swallow function and safety, and did not  reveal overt s/s aspiration.  Objective study was recommended due  to risk of silent aspiration.     Assessment / Plan / Recommendation Clinical Impression  Dysphagia Diagnosis: Mild pharyngeal phase dysphagia Clinical impression: Mild sensory and motor based pharyngeal  dysphagia, characterized by delayed swallow reflex, intermittent  trace penetration of thin liquids, and vallecular residue of  puree and cracker.  Oral phase appears to be [facility].  Reflex trigger   was noted to occur at the vallecula on puree, cracker, and  intermittently on thin liquids.  Spillage to pyriform was also  seen intermitently on thin liquids.  Trace penetration was seen  during the swallow of thin liquids, and was removed with cued  throat clear.  Penetration is considered within normal limits for  this age group.  Will modify diet for energy conservation, and  provide safe swallow precautions (upright, remain upright 30  minutes after meals, clear throat occasionally).  Recommend meds  whole in puree, due to large bolus size needed to take meds with  thin liquid.  ST to follow briefly for diet tolerance and  education.    Treatment Recommendation  Therapy as outlined in treatment plan below    Diet Recommendation Dysphagia 3 (Mechanical Soft);Thin liquid  (chop meats)   Liquid Administration via: Cup;Straw Medication Administration: Whole meds with puree Supervision: Patient able to self feed Compensations: Slow rate;Small sips/bites;Clear throat  intermittently Postural Changes and/or Swallow Maneuvers: Seated upright 90  degrees;Upright 30-60 min after meal    Other  Recommendations Oral Care Recommendations: Oral care QID Other Recommendations: Clarify dietary restrictions   Follow Up Recommendations  24 hour supervision/assistance    Frequency and Duration min 1 x/week  1 week   Pertinent Vitals/Pain No pain reported   SLP  Swallow Goals Patient will consume recommended diet without observed clinical  signs of aspiration with: Set-up Swallow Study Goal #1 - Progress: Progressing toward goal Patient will utilize recommended strategies during swallow to  increase swallowing safety with: Set-up Swallow Study Goal #2 - Progress: Progressing toward goal   General Date of Onset: 09/14/12 HPI: 79 year old female admitted 09/14/12 with RML PNA, patchy  airspace disease of RLL. PMH significant for COPD, GERD, hip fx  June 2014. BSE ordered to assess swallow function and safety, and  did not reveal overt  s/s aspiration.  Objective study was  recommended due to risk of silent aspiration. Type of Study: Modified Barium Swallowing Study Reason for Referral: Objectively evaluate swallowing function Previous Swallow Assessment: BSE 09/17/12 Diet Prior to this Study: Regular;Thin liquids Temperature Spikes Noted: No Respiratory Status: Room air History of Recent Intubation: No Behavior/Cognition: Alert;Cooperative;Hard of hearing;Pleasant  mood Oral Cavity - Dentition: Dentures, bottom;Dentures, top Oral Motor / Sensory Function: Within functional limits Self-Feeding Abilities: Able to feed self Patient Positioning: Upright in chair Baseline Vocal Quality: Clear Volitional Cough: Strong Volitional Swallow: Able to elicit Anatomy: Within functional limits Pharyngeal Secretions: Not observed secondary MBS    Reason for Referral Objectively evaluate swallowing function   Oral Phase Oral Preparation/Oral Phase Oral Phase: WFL   Pharyngeal Phase Pharyngeal Phase Pharyngeal Phase: Impaired Pharyngeal - Thin Pharyngeal - Thin Cup: Delayed swallow initiation;Premature  spillage to valleculae;Premature spillage to pyriform  sinuses;Penetration/Aspiration during swallow;Reduced  airway/laryngeal closure;Compensatory strategies attempted  (Comment) (small sip, throat clear) Penetration/Aspiration details (thin cup): Material enters  airway, passes BELOW cords then ejected out;Material does not  enter airway;Material enters airway, remains ABOVE vocal cords  and not ejected out;Material enters airway, remains ABOVE vocal  cords then ejected out Pharyngeal - Thin Straw: Premature spillage to  valleculae;Premature spillage to pyriform sinuses;Delayed swallow  initiation;Penetration/Aspiration during swallow;Reduced  airway/laryngeal closure;Compensatory strategies attempted  (Comment) (small sip, throat clear) Penetration/Aspiration details (thin straw): Material does not  enter airway;Material enters airway, remains ABOVE vocal cords   then ejected out;Material enters airway, remains ABOVE vocal  cords and not ejected out Pharyngeal - Solids Pharyngeal - Puree: Premature spillage to valleculae;Delayed  swallow initiation;Reduced tongue base retraction;Pharyngeal  residue - valleculae;Compensatory strategies attempted (Comment)  (dry swallow) Pharyngeal - Multi-consistency: Delayed swallow  initiation;Premature spillage to valleculae;Reduced tongue base  retraction;Pharyngeal residue - valleculae (dry swallow)  Cervical Esophageal Phase    GO Celia B. Murvin Natal [facility], CCC-SLP [phone]   Cervical Esophageal Phase Cervical Esophageal Phase: [facility]         Leigh Aurora 09/17/2012, 3:03 PM     Microbiology: No results found for this or any previous visit (from the past 240 hour(s)).   Labs: Basic Metabolic Panel:  Recent Labs Lab 09/19/12 0445 09/21/12 0420 09/23/12 0640 09/24/12 0305  NA 127* 127* 126* 126*  K 3.9 4.1 4.4 4.6  CL 88* 90* 92* 91*  CO2 28 27 25 25   GLUCOSE 114* 92 103* 98  BUN 57* 48* 41* 38*  CREATININE 2.56* 2.13* 2.14* 2.16*  CALCIUM 9.0 8.9 9.0 8.9   Liver Function Tests: No results found for this basename: AST, ALT, ALKPHOS, BILITOT, PROT, ALBUMIN,  in the last 168 hours No results found for this basename: LIPASE, AMYLASE,  in the last 168 hours No results found for this basename: AMMONIA,  in the last 168 hours CBC:  Recent Labs Lab 09/21/12 0420 09/22/12 0511 09/23/12 0640 09/24/12 0305 09/25/12 0350  WBC  10.5 10.0 9.0 8.3 7.2  HGB 9.8* 9.9* 9.8* 9.5* 9.7*  HCT 27.8* 28.7* 29.3* 27.0* 28.1*  MCV 99.3 100.7* 101.7* 100.4* 100.0  PLT 293 278 266 249 257   Cardiac Enzymes: No results found for this basename: CKTOTAL, CKMB, CKMBINDEX, TROPONINI,  in the last 168 hours BNP: BNP (last 3 results) No results found for this basename: PROBNP,  in the last 8760 hours CBG: No results found for this basename: GLUCAP,  in the last 168 hours     Signed:  Marlin Canary  [facility] 09/25/2012, 10:27 AM

## 2012-09-29 ENCOUNTER — Encounter (HOSPITAL_COMMUNITY)
Admission: RE | Admit: 2012-09-29 | Discharge: 2012-09-29 | Disposition: A | Discharge status: Home / Self Care | Payer: Medicare Other | Source: Ambulatory Visit | Attending: Nephrology | Admitting: Nephrology

## 2012-09-29 DIAGNOSIS — D638 Anemia in other chronic diseases classified elsewhere: Secondary | ICD-10-CM | POA: Insufficient documentation

## 2012-09-29 DIAGNOSIS — N184 Chronic kidney disease, stage 4 (severe): Secondary | ICD-10-CM | POA: Insufficient documentation

## 2012-09-29 LAB — IRON AND TIBC
Iron: 48 ug/dL (ref 42–135)
Saturation Ratios: 31 % (ref 20–55)

## 2012-09-29 MED ORDER — EPOETIN ALFA 20000 UNIT/ML IJ SOLN
INTRAMUSCULAR | Status: AC
  Administered 2012-09-29: 16:00:00 20000 [IU]
  Filled 2012-09-29: qty 1

## 2012-09-29 MED ORDER — EPOETIN ALFA 40000 UNIT/ML IJ SOLN: 30000.0000 [IU] | INTRAMUSCULAR | Status: DC

## 2012-09-29 MED ORDER — EPOETIN ALFA 10000 UNIT/ML IJ SOLN
INTRAMUSCULAR | Status: AC
  Administered 2012-09-29: 10000 [IU]
  Filled 2012-09-29: qty 1

## 2012-10-07 ENCOUNTER — Encounter (HOSPITAL_COMMUNITY)
Admission: RE | Admit: 2012-10-07 | Discharge: 2012-10-07 | Disposition: A | Discharge status: Home / Self Care | Payer: Medicare Other | Source: Ambulatory Visit | Attending: Nephrology | Admitting: Nephrology

## 2012-10-07 LAB — POCT HEMOGLOBIN-HEMACUE: Hemoglobin: 11.7 g/dL — ABNORMAL LOW (ref 12.0–15.0)

## 2012-10-07 MED ORDER — EPOETIN ALFA 40000 UNIT/ML IJ SOLN: 30000.0000 [IU] | INTRAMUSCULAR | Status: DC

## 2012-10-21 ENCOUNTER — Encounter (HOSPITAL_COMMUNITY)
Admission: RE | Admit: 2012-10-21 | Discharge: 2012-10-21 | Disposition: A | Discharge status: Home / Self Care | Payer: Medicare Other | Source: Ambulatory Visit | Attending: Nephrology | Admitting: Nephrology

## 2012-10-21 DIAGNOSIS — N184 Chronic kidney disease, stage 4 (severe): Secondary | ICD-10-CM | POA: Insufficient documentation

## 2012-10-21 DIAGNOSIS — D638 Anemia in other chronic diseases classified elsewhere: Secondary | ICD-10-CM | POA: Insufficient documentation

## 2012-10-21 LAB — POCT HEMOGLOBIN-HEMACUE: Hemoglobin: 9.4 g/dL — ABNORMAL LOW (ref 12.0–15.0)

## 2012-10-21 MED ORDER — EPOETIN ALFA 20000 UNIT/ML IJ SOLN
INTRAMUSCULAR | Status: AC
  Administered 2012-10-21: 15:00:00 20000 [IU]
  Filled 2012-10-21: qty 1

## 2012-10-21 MED ORDER — EPOETIN ALFA 40000 UNIT/ML IJ SOLN: 30000.0000 [IU] | INTRAMUSCULAR | Status: DC

## 2012-10-21 MED ORDER — EPOETIN ALFA 10000 UNIT/ML IJ SOLN
INTRAMUSCULAR | Status: AC
  Administered 2012-10-21: 10000 [IU]
  Filled 2012-10-21: qty 1

## 2012-10-27 ENCOUNTER — Other Ambulatory Visit (HOSPITAL_COMMUNITY): Payer: Self-pay | Admitting: *Deleted

## 2012-10-28 ENCOUNTER — Encounter (HOSPITAL_COMMUNITY)
Admission: RE | Admit: 2012-10-28 | Discharge: 2012-10-28 | Disposition: A | Discharge status: Home / Self Care | Payer: Medicare Other | Source: Ambulatory Visit | Attending: Nephrology | Admitting: Nephrology

## 2012-10-28 LAB — IRON AND TIBC
Saturation Ratios: 18 % — ABNORMAL LOW (ref 20–55)
TIBC: 182 ug/dL — ABNORMAL LOW (ref 250–470)
UIBC: 149 ug/dL (ref 125–400)

## 2012-10-28 MED ORDER — EPOETIN ALFA 40000 UNIT/ML IJ SOLN: 30000.0000 [IU] | INTRAMUSCULAR | Status: DC

## 2012-10-29 LAB — VITAMIN D 25 HYDROXY (VIT D DEFICIENCY, FRACTURES): Vit D, 25-Hydroxy: 77 ng/mL (ref 30–89)

## 2012-11-01 ENCOUNTER — Telehealth: Payer: Self-pay | Admitting: Emergency Medicine

## 2012-11-01 DIAGNOSIS — R911 Solitary pulmonary nodule: Secondary | ICD-10-CM

## 2012-11-01 NOTE — Telephone Encounter (Signed)
Daughter aware and order placed. Please advise PCC's thanks

## 2012-11-01 NOTE — Telephone Encounter (Signed)
See my note 09/24/12 > looks like she needs CT chest without contrast in November, then f/u with me after to review, 15 minute slot ok. Thanks

## 2012-11-01 NOTE — Telephone Encounter (Signed)
RB, please advise when you want patient to have CT scan (what kind,etc-so we can place the order) as well as when patient is to come in to be seen(consult or ROV). I see in Surgery notes scanned in EPIC on 09-27-12 (page 3) states for patient to follow up here. Thanks.

## 2012-11-02 ENCOUNTER — Telehealth: Payer: Self-pay | Admitting: Emergency Medicine

## 2012-11-02 NOTE — Telephone Encounter (Signed)
I spoke with [place], pt daughter and she states the pt is having a productive cough with dark colored phlegm, not blood. The pt was admitted for PNA in august and Dr. Delton Coombes did an ebus and advised to have repeat ct and f/u in 2 months. I advised them to call PCP and explain symptoms and maybe have OV because may be PNA again. No sooner appt available with RB at this time.  Carron Curie, CMA

## 2012-11-02 NOTE — Telephone Encounter (Signed)
appts scheduled 11/30/12 for ct and dr byrum Tobe Sos

## 2012-11-10 ENCOUNTER — Other Ambulatory Visit (HOSPITAL_COMMUNITY): Payer: Self-pay | Admitting: *Deleted

## 2012-11-11 ENCOUNTER — Encounter (HOSPITAL_COMMUNITY)
Admission: RE | Admit: 2012-11-11 | Discharge: 2012-11-11 | Disposition: A | Discharge status: Home / Self Care | Payer: Medicare Other | Source: Ambulatory Visit | Attending: Nephrology | Admitting: Nephrology

## 2012-11-11 LAB — POCT HEMOGLOBIN-HEMACUE: Hemoglobin: 10.9 g/dL — ABNORMAL LOW (ref 12.0–15.0)

## 2012-11-11 MED ORDER — EPOETIN ALFA 10000 UNIT/ML IJ SOLN
INTRAMUSCULAR | Status: AC
  Filled 2012-11-11: qty 1

## 2012-11-11 MED ORDER — EPOETIN ALFA 40000 UNIT/ML IJ SOLN
30000.0000 [IU] | INTRAMUSCULAR | Status: DC
  Administered 2012-11-11: 30000 [IU] via SUBCUTANEOUS

## 2012-11-11 MED ORDER — EPOETIN ALFA 20000 UNIT/ML IJ SOLN
INTRAMUSCULAR | Status: AC
  Filled 2012-11-11: qty 1

## 2012-11-12 MED FILL — Epoetin Alfa Inj 10000 Unit/ML: INTRAMUSCULAR | Qty: 1 | Status: AC

## 2012-11-12 MED FILL — Epoetin Alfa Inj 20000 Unit/ML: INTRAMUSCULAR | Qty: 1 | Status: AC

## 2012-11-18 ENCOUNTER — Encounter (HOSPITAL_COMMUNITY)
Admission: RE | Admit: 2012-11-18 | Discharge: 2012-11-18 | Disposition: A | Discharge status: Home / Self Care | Payer: Medicare Other | Source: Ambulatory Visit | Attending: Nephrology | Admitting: Nephrology

## 2012-11-18 LAB — POCT HEMOGLOBIN-HEMACUE: Hemoglobin: 11.3 g/dL — ABNORMAL LOW (ref 12.0–15.0)

## 2012-11-18 MED ORDER — EPOETIN ALFA 40000 UNIT/ML IJ SOLN: 30000.0000 [IU] | INTRAMUSCULAR | Status: DC

## 2012-11-18 MED ORDER — EPOETIN ALFA 20000 UNIT/ML IJ SOLN
INTRAMUSCULAR | Status: AC
  Administered 2012-11-18: 20000 [IU]
  Filled 2012-11-18: qty 1

## 2012-11-18 MED ORDER — EPOETIN ALFA 10000 UNIT/ML IJ SOLN
INTRAMUSCULAR | Status: AC
  Administered 2012-11-18: 15:00:00 10000 [IU]
  Filled 2012-11-18: qty 1

## 2012-11-25 ENCOUNTER — Encounter (HOSPITAL_COMMUNITY)
Admission: RE | Admit: 2012-11-25 | Discharge: 2012-11-25 | Disposition: A | Discharge status: Home / Self Care | Payer: Medicare Other | Source: Ambulatory Visit | Attending: Nephrology | Admitting: Nephrology

## 2012-11-25 DIAGNOSIS — D638 Anemia in other chronic diseases classified elsewhere: Secondary | ICD-10-CM | POA: Insufficient documentation

## 2012-11-25 DIAGNOSIS — N184 Chronic kidney disease, stage 4 (severe): Secondary | ICD-10-CM | POA: Insufficient documentation

## 2012-11-25 LAB — FERRITIN: Ferritin: 632 ng/mL — ABNORMAL HIGH (ref 10–291)

## 2012-11-25 MED ORDER — EPOETIN ALFA 40000 UNIT/ML IJ SOLN: 30000.0000 [IU] | INTRAMUSCULAR | Status: DC

## 2012-11-25 MED ORDER — EPOETIN ALFA 10000 UNIT/ML IJ SOLN
INTRAMUSCULAR | Status: AC
  Administered 2012-11-25: 10000 [IU] via SUBCUTANEOUS
  Filled 2012-11-25: qty 1

## 2012-11-25 MED ORDER — EPOETIN ALFA 20000 UNIT/ML IJ SOLN
INTRAMUSCULAR | Status: AC
  Administered 2012-11-25: 15:00:00 20000 [IU] via SUBCUTANEOUS
  Filled 2012-11-25: qty 1

## 2012-11-26 LAB — POCT HEMOGLOBIN-HEMACUE: Hemoglobin: 11.4 g/dL — ABNORMAL LOW (ref 12.0–15.0)

## 2012-11-30 ENCOUNTER — Ambulatory Visit (INDEPENDENT_AMBULATORY_CARE_PROVIDER_SITE_OTHER)
Admission: RE | Admit: 2012-11-30 | Discharge: 2012-11-30 | Disposition: A | Discharge status: Home / Self Care | Payer: Medicare Other | Source: Ambulatory Visit | Attending: Emergency Medicine | Admitting: Emergency Medicine

## 2012-11-30 ENCOUNTER — Ambulatory Visit (INDEPENDENT_AMBULATORY_CARE_PROVIDER_SITE_OTHER): Payer: Medicare Other | Admitting: Emergency Medicine

## 2012-11-30 ENCOUNTER — Encounter: Payer: Self-pay | Admitting: Emergency Medicine

## 2012-11-30 VITALS — BP 100/58 | HR 70 | Ht 63.0 in | Wt 110.0 lb

## 2012-11-30 DIAGNOSIS — J479 Bronchiectasis, uncomplicated: Secondary | ICD-10-CM

## 2012-11-30 DIAGNOSIS — R911 Solitary pulmonary nodule: Secondary | ICD-10-CM

## 2012-11-30 MED ORDER — DOXYCYCLINE HYCLATE 100 MG PO TABS: 100.0000 mg | ORAL_TABLET | Freq: Two times a day (BID) | ORAL | Status: DC

## 2012-11-30 NOTE — Patient Instructions (Signed)
Please take doxycycline 100mg  twice a day for two weeks Follow with Dr Delton Coombes in 1 month

## 2012-11-30 NOTE — Progress Notes (Signed)
HPI:  79 y/o female was admitted on 8/26 to the [facility] service at [facility] for pneumonia unresponsive to antibiotics as an outpatient; a CT chest was concerning for an endobronchial lesion causing post obstructive pneumonia. She underwent non-dx FOB on 09/21/12.   8/27 CT chest >> complete occlusion of the RML bronchus with dense RML and RLL consolidation and interlobular septal thickening; enlarge paratracheal lymph nodes  9/2 FOB>> single focus abnormal glandular tissue, unable to definitively call malignancy  She is having cough, copious thick mucous, no hemoptysis   Past Medical History  Diagnosis Date  . HTN (hypertension)     2 YEARS  . Hypothyroidism   . Temporal arteritis   . Pulmonary embolism 1997  . Mitral regurgitation     a. mild by 01/2010 echo.  . Chronic diastolic CHF (congestive heart failure)     a. 01/2010 Echo: EF 60%, mild LVH, High LV filling pressures, No RWMA, mild MR, PASP .  Arman Bogus   . Chronic anemia   . Paroxysmal atrial fibrillation   . Pulmonary HTN     a. PASP by 01/2010 echo.  . S/P cardiac cath     a. 12/2009 R Heart Cath:  RA 12, RV 45/11, PA 47/21, PCWP MEAN 26, CO 4.56, CI 2.7.  . Ischemia     a. 12/2009 Myoview: Ant ischemia, EF 75%----NO CATH DONE DUE TO CKD  . End stage renal disease     a. s/p AVF - not on dialysis.  [name] History of urinary frequency   . GERD (gastroesophageal reflux disease)   . Sleep trouble   . Constipation, chronic   . Hard of hearing   . Complication of anesthesia   . PONV (postoperative nausea and vomiting)   . Pneumonia 09/15/2012     Family History  Problem Relation Age of Onset  . Heart attack Mother 47  . Stroke Father 62  . Hypertension Sister   . Other      THERE IS NO EARLY HEART DISEASE  . Cancer Brother     prostate  . Diabetes Daughter   . Hyperlipidemia Daughter   . Hypertension Daughter      History   Social History  . Marital Status: Widowed    Spouse Name: N/A    Number of  Children: 2  . Years of Education: N/A   Occupational History  . Retired    Social History Main Topics  . Smoking status: Never Smoker   . Smokeless tobacco: Never Used  . Alcohol Use: No  . Drug Use: No  . Sexual Activity: No   Other Topics Concern  . Not on file   Social History Narrative   Widowed with two children, four grandchildren, and several great children.  Lives in [place] by herself.  Active throughout the day @ [employer].     Allergies  Allergen Reactions  . Codeine Other (See Comments)    unknown  . Sulfa Antibiotics     unknown     Outpatient Prescriptions Prior to Visit  Medication Sig Dispense Refill  . amLODipine (NORVASC) 5 MG tablet Take 5 mg by mouth daily.        . calcitRIOL (ROCALTROL) 0.25 MCG capsule Take 0.25 mcg by mouth daily.      . feeding supplement (ENSURE COMPLETE) LIQD Take 237 mLs by mouth daily.      . furosemide (LASIX) 40 MG tablet Take 40 mg by mouth daily.      [name]  guaifenesin (ROBITUSSIN) 100 MG/5ML syrup Take 200 mg by mouth 3 (three) times daily as needed for cough.      [name] HYDROcodone-acetaminophen (NORCO/VICODIN) 5-325 MG per tablet Take 1 tablet by mouth every morning.      [name] levothyroxine (SYNTHROID, LEVOTHROID) 75 MCG tablet Take 75 mcg by mouth daily.        . Melatonin 5 MG TABS Take 5 mg by mouth at bedtime.       . metoprolol (LOPRESSOR) 100 MG tablet Take 1 tablet (100 mg total) by mouth 2 (two) times daily.  60 tablet  11  . Multiple Vitamin (MULTIVITAMIN WITH MINERALS) TABS Take 1 tablet by mouth daily. Centrum Silver      . omeprazole (PRILOSEC) 20 MG capsule Take 20 mg by mouth daily.        [name] oxybutynin (DITROPAN-XL) 10 MG 24 hr tablet Take 10 mg by mouth daily.      . polyethylene glycol (MIRALAX / GLYCOLAX) packet Take 17 g by mouth daily.      . sertraline (ZOLOFT) 25 MG tablet Take 25 mg by mouth daily.      [name] warfarin (COUMADIN) 3 MG tablet Take 3 mg by mouth See admin instructions. Take 1 tablet on Sun,  Tues, Wed, Thurs, Sat      . Cholecalciferol (VITAMIN D) 400 UNITS capsule Take 800 Units by mouth daily.       [name] doxycycline (VIBRA-TABS) 100 MG tablet Take 1 tablet (100 mg total) by mouth every 12 (twelve) hours.  6 tablet  0  . levofloxacin (LEVAQUIN) 250 MG tablet Take 1 tablet (250 mg total) by mouth daily.  3 tablet  0  . lubiprostone (AMITIZA) 24 MCG capsule Take 1 capsule (24 mcg total) by mouth 2 (two) times daily with a meal.  60 capsule  0  . warfarin (COUMADIN) 4 MG tablet Take 4 mg by mouth 2 (two) times a week. Take 1 tablet on Mon and Fri       No facility-administered medications prior to visit.   Filed Vitals:   11/30/12 1632  BP: 100/58  Pulse: 70  Height: 5\' 3"  (1.6 m)  Weight: 110 lb (49.896 kg)  SpO2: 94%   Gen: Pleasant, well-nourished, in no distress, kyphotic  ENT: No lesions,  mouth clear,  oropharynx clear, no postnasal drip  Neck: No JVD, no TMG, no carotid bruits  Lungs: No use of accessory muscles, clear without rales or rhonchi  Cardiovascular: RRR, heart sounds normal, no murmur or gallops, no peripheral edema  Musculoskeletal: No deformities, no cyanosis or clubbing  Neuro: alert, non focal, poor memory, evidence for dementia  Skin: Warm, no lesions or rashes   11/30/12 --  COMPARISON: 10/16/2012  FINDINGS:  There is no pleural effusion identified. There has been interval  resolution of right middle lobe airspace consolidation. Multifocal  areas of nodularity identified throughout the right lung. This  predominantly affects the right middle lobe and right lower lobe.  Many of these nodules have a stress set mini of these nodules are  peripheral in location and have a tree-in-bud configuration. Areas  of bronchiectasis are noted within the right middle lobe and right  base. Mild atelectasis and bronchial wall thickening is noted within  the left upper lobe and superior portion of the left lower lobe.  There is a new nodular area of  consolidation within the right lower  lobe, image 24/series 3.  The trachea appears patent. There is abnormal wall  thickening  involving the bronchi, right greater than left. The heart size is  within normal limits. No significant pericardial effusion  identified. Persistent enlarged mediastinal lymph nodes. The right  paratracheal lymph node measures 1 cm, image 18/ series 2.  Previously 1.3 cm. Sub- carinal lymph node measures 1.3 cm, image  23/series 2. Previously this measured the same.  No axillary or supraclavicular lymph nodes.  Incidental imaging through the upper abdomen is on unremarkable. No  worrisome lytic or sclerotic bone lesions identified.  IMPRESSION:  1. Interval improvement in right middle lobe airspace consolidation.  Findings are likely the sequelae of inflammation or infection.  2. Persistent scattered areas of peripheral predominant tree-in-bud  nodularity involving the right midlung and right base. There are  also areas of bronchiectasis noted bilaterally. Findings are favored  to represent sequelae of chronic, atypical indolent infection.  3. New nodular density within the right lower lobe is also likely  postinflammatory infectious in etiology.  4. Persistent enlarged mediastinal lymph nodes which in the setting  of infection are likely reactive. Metastatic adenopathy is less  favored.  5. Calcified atherosclerotic disease.  Bronchiectasis without acute exacerbation Her Ct scan has cleared to large degree but now shows residual bronchiectasis, airspace disease, a RLL nodular infiltrate. I don't see a RML obstructing lesion. The LAD has decreased in size.  - favor treating for 2 weeks with doxy, following clinically and by CXR - rov 1 mon

## 2012-11-30 NOTE — Assessment & Plan Note (Signed)
Her Ct scan has cleared to large degree but now shows residual bronchiectasis, airspace disease, a RLL nodular infiltrate. I don't see a RML obstructing lesion. The LAD has decreased in size.  - favor treating for 2 weeks with doxy, following clinically and by CXR - rov 1 mon

## 2012-12-02 ENCOUNTER — Encounter (HOSPITAL_COMMUNITY)
Admission: RE | Admit: 2012-12-02 | Discharge: 2012-12-02 | Disposition: A | Discharge status: Home / Self Care | Payer: Medicare Other | Source: Ambulatory Visit | Attending: Nephrology | Admitting: Nephrology

## 2012-12-02 MED ORDER — EPOETIN ALFA 10000 UNIT/ML IJ SOLN
INTRAMUSCULAR | Status: AC
  Administered 2012-12-02: 10000 [IU]
  Filled 2012-12-02: qty 1

## 2012-12-02 MED ORDER — EPOETIN ALFA 20000 UNIT/ML IJ SOLN
INTRAMUSCULAR | Status: AC
  Administered 2012-12-02: 20000 [IU]
  Filled 2012-12-02: qty 1

## 2012-12-02 MED ORDER — EPOETIN ALFA 40000 UNIT/ML IJ SOLN: 30000.0000 [IU] | INTRAMUSCULAR | Status: DC

## 2012-12-09 ENCOUNTER — Encounter (HOSPITAL_COMMUNITY)
Admission: RE | Admit: 2012-12-09 | Discharge: 2012-12-09 | Disposition: A | Discharge status: Home / Self Care | Payer: Medicare Other | Source: Ambulatory Visit | Attending: Nephrology | Admitting: Nephrology

## 2012-12-09 MED ORDER — EPOETIN ALFA 10000 UNIT/ML IJ SOLN
INTRAMUSCULAR | Status: AC
  Administered 2012-12-09: 15:00:00 10000 [IU] via SUBCUTANEOUS
  Filled 2012-12-09: qty 1

## 2012-12-09 MED ORDER — EPOETIN ALFA 40000 UNIT/ML IJ SOLN: 30000.0000 [IU] | INTRAMUSCULAR | Status: DC

## 2012-12-09 MED ORDER — EPOETIN ALFA 20000 UNIT/ML IJ SOLN
INTRAMUSCULAR | Status: AC
  Administered 2012-12-09: 20000 [IU] via SUBCUTANEOUS
  Filled 2012-12-09: qty 1

## 2012-12-17 ENCOUNTER — Encounter (HOSPITAL_COMMUNITY)
Admission: RE | Admit: 2012-12-17 | Discharge: 2012-12-17 | Disposition: A | Discharge status: Home / Self Care | Payer: Medicare Other | Source: Ambulatory Visit | Attending: Nephrology | Admitting: Nephrology

## 2012-12-17 LAB — POCT HEMOGLOBIN-HEMACUE: Hemoglobin: 10.2 g/dL — ABNORMAL LOW (ref 12.0–15.0)

## 2012-12-17 MED ORDER — EPOETIN ALFA 40000 UNIT/ML IJ SOLN: 30000.0000 [IU] | INTRAMUSCULAR | Status: DC

## 2012-12-17 MED ORDER — EPOETIN ALFA 10000 UNIT/ML IJ SOLN
INTRAMUSCULAR | Status: AC
  Administered 2012-12-17: 11:00:00 10000 [IU] via SUBCUTANEOUS
  Filled 2012-12-17: qty 1

## 2012-12-17 MED ORDER — EPOETIN ALFA 20000 UNIT/ML IJ SOLN
INTRAMUSCULAR | Status: AC
  Administered 2012-12-17: 11:00:00 20000 [IU] via SUBCUTANEOUS
  Filled 2012-12-17: qty 1

## 2012-12-22 ENCOUNTER — Other Ambulatory Visit (HOSPITAL_COMMUNITY): Payer: Self-pay | Admitting: *Deleted

## 2012-12-23 ENCOUNTER — Encounter (HOSPITAL_COMMUNITY)
Admission: RE | Admit: 2012-12-23 | Discharge: 2012-12-23 | Disposition: A | Discharge status: Home / Self Care | Payer: Medicare Other | Source: Ambulatory Visit | Attending: Nephrology | Admitting: Nephrology

## 2012-12-23 DIAGNOSIS — N184 Chronic kidney disease, stage 4 (severe): Secondary | ICD-10-CM | POA: Insufficient documentation

## 2012-12-23 DIAGNOSIS — D638 Anemia in other chronic diseases classified elsewhere: Secondary | ICD-10-CM | POA: Insufficient documentation

## 2012-12-23 LAB — RENAL FUNCTION PANEL
Albumin: 2.7 g/dL — ABNORMAL LOW (ref 3.5–5.2)
CO2: 29 mEq/L (ref 19–32)
Calcium: 9.3 mg/dL (ref 8.4–10.5)
Chloride: 88 mEq/L — ABNORMAL LOW (ref 96–112)
GFR calc Af Amer: 16 mL/min — ABNORMAL LOW (ref >90)
GFR calc non Af Amer: 14 mL/min — ABNORMAL LOW (ref >90)
Potassium: 4 mEq/L (ref 3.5–5.1)

## 2012-12-23 LAB — HEMOGLOBIN AND HEMATOCRIT, BLOOD: Hemoglobin: 11.9 g/dL — ABNORMAL LOW (ref 12.0–15.0)

## 2012-12-23 LAB — MAGNESIUM: Magnesium: 1.9 mg/dL (ref 1.5–2.5)

## 2012-12-23 MED ORDER — EPOETIN ALFA 40000 UNIT/ML IJ SOLN: 30000.0000 [IU] | INTRAMUSCULAR | Status: DC

## 2012-12-24 LAB — IRON AND TIBC
Iron: 30 ug/dL — ABNORMAL LOW (ref 42–135)
Saturation Ratios: 20 % (ref 20–55)
TIBC: 147 ug/dL — ABNORMAL LOW (ref 250–470)
UIBC: 117 ug/dL — ABNORMAL LOW (ref 125–400)

## 2012-12-24 LAB — VITAMIN D 25 HYDROXY (VIT D DEFICIENCY, FRACTURES): Vit D, 25-Hydroxy: 76 ng/mL (ref 30–89)

## 2012-12-24 LAB — FERRITIN: Ferritin: 572 ng/mL — ABNORMAL HIGH (ref 10–291)

## 2012-12-30 ENCOUNTER — Ambulatory Visit (INDEPENDENT_AMBULATORY_CARE_PROVIDER_SITE_OTHER)
Admission: RE | Admit: 2012-12-30 | Discharge: 2012-12-30 | Disposition: A | Discharge status: Home / Self Care | Payer: Medicare Other | Source: Ambulatory Visit | Attending: Emergency Medicine | Admitting: Emergency Medicine

## 2012-12-30 ENCOUNTER — Ambulatory Visit (INDEPENDENT_AMBULATORY_CARE_PROVIDER_SITE_OTHER): Payer: Medicare Other | Admitting: Emergency Medicine

## 2012-12-30 ENCOUNTER — Encounter: Payer: Self-pay | Admitting: Emergency Medicine

## 2012-12-30 VITALS — BP 110/78 | HR 122 | Ht 60.5 in | Wt 113.0 lb

## 2012-12-30 DIAGNOSIS — J479 Bronchiectasis, uncomplicated: Secondary | ICD-10-CM

## 2012-12-30 NOTE — Assessment & Plan Note (Signed)
She has improved over the last month. Now stable. Will follow her cough, CXR, clinical status.  - CXR today - rov 6

## 2012-12-30 NOTE — Patient Instructions (Signed)
Please get a CXR today  Follow with Dr Delton Coombes in 6 months or sooner if you have any problems

## 2012-12-30 NOTE — Progress Notes (Signed)
HPI:  79 y/o female was admitted on 8/26 to the [facility] service at [facility] for pneumonia unresponsive to antibiotics as an outpatient; a CT chest was concerning for an endobronchial lesion causing post obstructive pneumonia. She underwent non-dx FOB on 09/21/12.   8/27 CT chest >> complete occlusion of the RML bronchus with dense RML and RLL consolidation and interlobular septal thickening; enlarge paratracheal lymph nodes  9/2 FOB>> single focus abnormal glandular tissue, unable to definitively call malignancy  She is having cough, copious thick mucous, no hemoptysis  ROV 12/30/12 -- follows up for hx PNA and bronchiectasis. Treated last time with 2 more weeks of abx > doxy. She returns today for f/u reports that her cough is much improved. Color of the sputum has cleared.    Filed Vitals:   12/30/12 1541  BP: 110/78  Pulse: 122  Height: 5' 0.5" (1.537 m)  Weight: 113 lb (51.256 kg)  SpO2: 98%   Gen: Pleasant, well-nourished, in no distress, kyphotic  ENT: No lesions,  mouth clear,  oropharynx clear, no postnasal drip  Neck: No JVD, no TMG, no carotid bruits  Lungs: No use of accessory muscles, clear without rales or rhonchi  Cardiovascular: RRR, heart sounds normal, no murmur or gallops, no peripheral edema  Musculoskeletal: No deformities, no cyanosis or clubbing  Neuro: alert, non focal, poor memory, evidence for dementia  Skin: Warm, no lesions or rashes   11/30/12 --  COMPARISON: 10/16/2012  FINDINGS:  There is no pleural effusion identified. There has been interval  resolution of right middle lobe airspace consolidation. Multifocal  areas of nodularity identified throughout the right lung. This  predominantly affects the right middle lobe and right lower lobe.  Many of these nodules have a stress set mini of these nodules are  peripheral in location and have a tree-in-bud configuration. Areas  of bronchiectasis are noted within the right middle lobe and right  base. Mild  atelectasis and bronchial wall thickening is noted within  the left upper lobe and superior portion of the left lower lobe.  There is a new nodular area of consolidation within the right lower  lobe, image 24/series 3.  The trachea appears patent. There is abnormal wall thickening  involving the bronchi, right greater than left. The heart size is  within normal limits. No significant pericardial effusion  identified. Persistent enlarged mediastinal lymph nodes. The right  paratracheal lymph node measures 1 cm, image 18/ series 2.  Previously 1.3 cm. Sub- carinal lymph node measures 1.3 cm, image  23/series 2. Previously this measured the same.  No axillary or supraclavicular lymph nodes.  Incidental imaging through the upper abdomen is on unremarkable. No  worrisome lytic or sclerotic bone lesions identified.  IMPRESSION:  1. Interval improvement in right middle lobe airspace consolidation.  Findings are likely the sequelae of inflammation or infection.  2. Persistent scattered areas of peripheral predominant tree-in-bud  nodularity involving the right midlung and right base. There are  also areas of bronchiectasis noted bilaterally. Findings are favored  to represent sequelae of chronic, atypical indolent infection.  3. New nodular density within the right lower lobe is also likely  postinflammatory infectious in etiology.  4. Persistent enlarged mediastinal lymph nodes which in the setting  of infection are likely reactive. Metastatic adenopathy is less  favored.  5. Calcified atherosclerotic disease.  Bronchiectasis without acute exacerbation She has improved over the last month. Now stable. Will follow her cough, CXR, clinical status.  - CXR today -  rov 6

## 2013-01-06 ENCOUNTER — Encounter (HOSPITAL_COMMUNITY)
Admission: RE | Admit: 2013-01-06 | Discharge: 2013-01-06 | Disposition: A | Discharge status: Home / Self Care | Payer: Medicare Other | Source: Ambulatory Visit | Attending: Nephrology | Admitting: Nephrology

## 2013-01-06 MED ORDER — EPOETIN ALFA 40000 UNIT/ML IJ SOLN: 30000.0000 [IU] | INTRAMUSCULAR | Status: DC

## 2013-01-06 MED ORDER — EPOETIN ALFA 20000 UNIT/ML IJ SOLN
INTRAMUSCULAR | Status: AC
  Administered 2013-01-06: 20000 [IU] via SUBCUTANEOUS
  Filled 2013-01-06: qty 1

## 2013-01-06 MED ORDER — EPOETIN ALFA 10000 UNIT/ML IJ SOLN
INTRAMUSCULAR | Status: AC
  Administered 2013-01-06: 10000 [IU] via SUBCUTANEOUS
  Filled 2013-01-06: qty 1

## 2013-01-17 ENCOUNTER — Encounter (HOSPITAL_COMMUNITY)
Admission: RE | Admit: 2013-01-17 | Discharge: 2013-01-17 | Disposition: A | Discharge status: Home / Self Care | Payer: Medicare Other | Source: Ambulatory Visit | Attending: Nephrology | Admitting: Nephrology

## 2013-01-17 LAB — IRON AND TIBC
Iron: 52 ug/dL (ref 42–135)
Saturation Ratios: 31 % (ref 20–55)
UIBC: 117 ug/dL — ABNORMAL LOW (ref 125–400)

## 2013-01-17 LAB — POCT HEMOGLOBIN-HEMACUE: Hemoglobin: 11.3 g/dL — ABNORMAL LOW (ref 12.0–15.0)

## 2013-01-17 LAB — FERRITIN: Ferritin: 836 ng/mL — ABNORMAL HIGH (ref 10–291)

## 2013-01-17 MED ORDER — EPOETIN ALFA 40000 UNIT/ML IJ SOLN: 30000.0000 [IU] | INTRAMUSCULAR | Status: DC

## 2013-01-17 MED ORDER — EPOETIN ALFA 10000 UNIT/ML IJ SOLN
INTRAMUSCULAR | Status: AC
  Administered 2013-01-17: 16:00:00 10000 [IU]
  Filled 2013-01-17: qty 1

## 2013-01-17 MED ORDER — EPOETIN ALFA 20000 UNIT/ML IJ SOLN
INTRAMUSCULAR | Status: AC
  Administered 2013-01-17: 16:00:00 20000 [IU]
  Filled 2013-01-17: qty 1

## 2013-01-24 ENCOUNTER — Encounter (HOSPITAL_COMMUNITY)
Admission: RE | Admit: 2013-01-24 | Discharge: 2013-01-24 | Disposition: A | Discharge status: Home / Self Care | Payer: Medicare Other | Source: Ambulatory Visit | Attending: Nephrology | Admitting: Nephrology

## 2013-01-24 DIAGNOSIS — D638 Anemia in other chronic diseases classified elsewhere: Secondary | ICD-10-CM | POA: Insufficient documentation

## 2013-01-24 DIAGNOSIS — N184 Chronic kidney disease, stage 4 (severe): Secondary | ICD-10-CM | POA: Insufficient documentation

## 2013-01-24 MED ORDER — EPOETIN ALFA 10000 UNIT/ML IJ SOLN
INTRAMUSCULAR | Status: AC
  Administered 2013-01-24: 10000 [IU]
  Filled 2013-01-24: qty 1

## 2013-01-24 MED ORDER — EPOETIN ALFA 40000 UNIT/ML IJ SOLN: 30000.0000 [IU] | INTRAMUSCULAR | Status: DC

## 2013-01-24 MED ORDER — EPOETIN ALFA 20000 UNIT/ML IJ SOLN
INTRAMUSCULAR | Status: AC
  Administered 2013-01-24: 15:00:00 20000 [IU]
  Filled 2013-01-24: qty 1

## 2013-01-25 LAB — POCT HEMOGLOBIN-HEMACUE: Hemoglobin: 10.2 g/dL — ABNORMAL LOW (ref 12.0–15.0)

## 2013-02-01 ENCOUNTER — Encounter (HOSPITAL_COMMUNITY)
Admission: RE | Admit: 2013-02-01 | Discharge: 2013-02-01 | Disposition: A | Discharge status: Home / Self Care | Payer: Medicare Other | Source: Ambulatory Visit | Attending: Nephrology | Admitting: Nephrology

## 2013-02-01 LAB — POCT HEMOGLOBIN-HEMACUE: HEMOGLOBIN: 10.3 g/dL — AB (ref 12.0–15.0)

## 2013-02-01 MED ORDER — EPOETIN ALFA 20000 UNIT/ML IJ SOLN
INTRAMUSCULAR | Status: AC
  Administered 2013-02-01: 15:00:00 20000 [IU]
  Filled 2013-02-01: qty 1

## 2013-02-01 MED ORDER — EPOETIN ALFA 10000 UNIT/ML IJ SOLN
INTRAMUSCULAR | Status: AC
  Administered 2013-02-01: 15:00:00 10000 [IU]
  Filled 2013-02-01: qty 1

## 2013-02-01 MED ORDER — EPOETIN ALFA 40000 UNIT/ML IJ SOLN: 30000.0000 [IU] | INTRAMUSCULAR | Status: DC

## 2013-02-08 ENCOUNTER — Other Ambulatory Visit (HOSPITAL_COMMUNITY): Payer: Self-pay | Admitting: *Deleted

## 2013-02-09 ENCOUNTER — Encounter (HOSPITAL_COMMUNITY)
Admission: RE | Admit: 2013-02-09 | Discharge: 2013-02-09 | Disposition: A | Discharge status: Home / Self Care | Payer: Medicare Other | Source: Ambulatory Visit | Attending: Nephrology | Admitting: Nephrology

## 2013-02-09 LAB — POCT HEMOGLOBIN-HEMACUE: Hemoglobin: 9.9 g/dL — ABNORMAL LOW (ref 12.0–15.0)

## 2013-02-09 MED ORDER — EPOETIN ALFA 40000 UNIT/ML IJ SOLN: 30000.0000 [IU] | INTRAMUSCULAR | Status: DC

## 2013-02-09 MED ORDER — EPOETIN ALFA 20000 UNIT/ML IJ SOLN
INTRAMUSCULAR | Status: AC
  Administered 2013-02-09: 20000 [IU] via SUBCUTANEOUS
  Filled 2013-02-09: qty 1

## 2013-02-09 MED ORDER — EPOETIN ALFA 10000 UNIT/ML IJ SOLN
INTRAMUSCULAR | Status: AC
  Administered 2013-02-09: 16:00:00 10000 [IU] via SUBCUTANEOUS
  Filled 2013-02-09: qty 1

## 2013-02-14 ENCOUNTER — Ambulatory Visit (INDEPENDENT_AMBULATORY_CARE_PROVIDER_SITE_OTHER): Payer: Medicare Other | Admitting: Emergency Medicine

## 2013-02-14 ENCOUNTER — Other Ambulatory Visit: Payer: Self-pay | Admitting: Emergency Medicine

## 2013-02-14 ENCOUNTER — Encounter: Payer: Self-pay | Admitting: Emergency Medicine

## 2013-02-14 ENCOUNTER — Ambulatory Visit (INDEPENDENT_AMBULATORY_CARE_PROVIDER_SITE_OTHER)
Admission: RE | Admit: 2013-02-14 | Discharge: 2013-02-14 | Disposition: A | Discharge status: Home / Self Care | Payer: Medicare Other | Source: Ambulatory Visit | Attending: Emergency Medicine | Admitting: Emergency Medicine

## 2013-02-14 VITALS — BP 94/54 | HR 63 | Temp 97.8°F | Ht 62.0 in | Wt 113.0 lb

## 2013-02-14 DIAGNOSIS — J471 Bronchiectasis with (acute) exacerbation: Secondary | ICD-10-CM

## 2013-02-14 MED ORDER — LEVOFLOXACIN 500 MG PO TABS: 500.0000 mg | ORAL_TABLET | Freq: Every day | ORAL | Status: DC

## 2013-02-14 MED ORDER — AEROCHAMBER PLUS MISC: Status: AC

## 2013-02-14 NOTE — Progress Notes (Signed)
HPI:  80 y/o female was [facility] 8/26 [facility] for pneumonia unresponsive to antibiotics as an outpatient; a CT chest was concerning for an endobronchial lesion causing post obstructive pneumonia. She underwent non-dx FOB on 09/21/12.   8/27 CT chest >> complete occlusion of the RML bronchus with dense RML and RLL consolidation and interlobular septal thickening; enlarge paratracheal lymph nodes  9/2 FOB>> single focus abnormal glandular tissue, unable to definitively call malignancy  She is having cough, copious thick mucous, no hemoptysis  ROV 12/30/12 -- follows up for hx PNA and bronchiectasis. Treated last time with 2 more weeks of abx > doxy. She returns today for f/u reports that her cough is much improved. Color of the sputum has cleared.   ROV 02/14/13 -- follows up for bronchiectasis and RML scar, chronic cough. Since last visit her cough has increased, purulent sputum. Mostly in the am, but can happen all day. She has stable exertional SOB.    Filed Vitals:   02/14/13 1443  BP: 94/54  Pulse: 63  Temp: 97.8 F (36.6 C)  TempSrc: Oral  Height: 5\' 2"  (1.575 m)  Weight: 113 lb (51.256 kg)  SpO2: 98%   Gen: Pleasant, well-nourished, in no distress, kyphotic  ENT: No lesions,  mouth clear,  oropharynx clear, no postnasal drip  Neck: No JVD, no TMG, no carotid bruits  Lungs: No use of accessory muscles, clear without rales or rhonchi  Cardiovascular: RRR, heart sounds normal, no murmur or gallops, no peripheral edema  Musculoskeletal: No deformities, no cyanosis or clubbing  Neuro: alert, non focal, poor memory, evidence for dementia  Skin: Warm, no lesions or rashes   11/30/12 --  COMPARISON: 10/16/2012  FINDINGS:  There is no pleural effusion identified. There has been interval  resolution of right middle lobe airspace consolidation. Multifocal  areas of nodularity identified throughout the right lung. This  predominantly affects the right  middle lobe and right lower lobe.  Many of these nodules have a stress set mini of these nodules are  peripheral in location and have a tree-in-bud configuration. Areas  of bronchiectasis are noted within the right middle lobe and right  base. Mild atelectasis and bronchial wall thickening is noted within  the left upper lobe and superior portion of the left lower lobe.  There is a new nodular area of consolidation within the right lower  lobe, image 24/series 3.  The trachea appears patent. There is abnormal wall thickening  involving the bronchi, right greater than left. The heart size is  within normal limits. No significant pericardial effusion  identified. Persistent enlarged mediastinal lymph nodes. The right  paratracheal lymph node measures 1 cm, image 18/ series 2.  Previously 1.3 cm. Sub- carinal lymph node measures 1.3 cm, image  23/series 2. Previously this measured the same.  No axillary or supraclavicular lymph nodes.  Incidental imaging through the upper abdomen is on unremarkable. No  worrisome lytic or sclerotic bone lesions identified.  IMPRESSION:  1. Interval improvement in right middle lobe airspace consolidation.  Findings are likely the sequelae of inflammation or infection.  2. Persistent scattered areas of peripheral predominant tree-in-bud  nodularity involving the right midlung and right base. There are  also areas of bronchiectasis noted bilaterally. Findings are favored  to represent sequelae of chronic, atypical indolent infection.  3. New nodular density within the right lower lobe is also likely  postinflammatory infectious in etiology.  4. Persistent enlarged mediastinal lymph nodes which  in the setting  of infection are likely reactive. Metastatic adenopathy is less  favored.  5. Calcified atherosclerotic disease.  Bronchiectasis with acute exacerbation - will check CXR to insure no recurrence of RML collapse. If so may need to consider performing CT  scan sooner. Otherwise will do in the Spring - levaquin x 10 days - trial albuterol prn to see if it helps secretions  - rov 4-6 weeks

## 2013-02-14 NOTE — Assessment & Plan Note (Addendum)
-   will check CXR to insure no recurrence of RML collapse. If so may need to consider performing CT scan sooner. Otherwise will do in the Spring - levaquin x 10 days - trial albuterol prn to see if it helps secretions  - rov 4-6 weeks

## 2013-02-14 NOTE — Patient Instructions (Addendum)
CXR today  Please take levaquin 500mg  daily for 10 days. PLEASE LET DR Marina GoodellPERRY KNOW SO YOUR COUMADIN CAN BE ADJUSTED We will try using albuterol 2 puffs up to every 4 hours with a spacer as needed for shortness of breath or to help clear mucous with your cough We will plan to repeat your CT scan in the Spring Follow with Dr Delton CoombesByrum in 4-6 weeks

## 2013-02-15 ENCOUNTER — Telehealth: Payer: Self-pay | Admitting: Emergency Medicine

## 2013-02-15 NOTE — Telephone Encounter (Signed)
CXR with RUL and RML infiltrates. I called and informed Nena AlexanderGeneva Lunsford of the results. She will start Levaquin today x 10 days. i asked her to call us in a week if not improving.

## 2013-02-16 ENCOUNTER — Encounter (HOSPITAL_COMMUNITY)
Admission: RE | Admit: 2013-02-16 | Discharge: 2013-02-16 | Disposition: A | Discharge status: Home / Self Care | Payer: Medicare Other | Source: Ambulatory Visit | Attending: Nephrology | Admitting: Nephrology

## 2013-02-16 LAB — POCT HEMOGLOBIN-HEMACUE: Hemoglobin: 9.9 g/dL — ABNORMAL LOW (ref 12.0–15.0)

## 2013-02-16 LAB — IRON AND TIBC
Iron: 28 ug/dL — ABNORMAL LOW (ref 42–135)
SATURATION RATIOS: 19 % — AB (ref 20–55)
TIBC: 149 ug/dL — AB (ref 250–470)
UIBC: 121 ug/dL — AB (ref 125–400)

## 2013-02-16 LAB — FERRITIN: Ferritin: 946 ng/mL — ABNORMAL HIGH (ref 10–291)

## 2013-02-16 MED ORDER — EPOETIN ALFA 40000 UNIT/ML IJ SOLN: 30000.0000 [IU] | INTRAMUSCULAR | Status: DC

## 2013-02-16 MED ORDER — EPOETIN ALFA 20000 UNIT/ML IJ SOLN
INTRAMUSCULAR | Status: AC
  Administered 2013-02-16: 15:00:00 20000 [IU] via SUBCUTANEOUS
  Filled 2013-02-16: qty 1

## 2013-02-16 MED ORDER — EPOETIN ALFA 10000 UNIT/ML IJ SOLN
INTRAMUSCULAR | Status: AC
  Administered 2013-02-16: 10000 [IU] via SUBCUTANEOUS
  Filled 2013-02-16: qty 1

## 2013-02-17 ENCOUNTER — Telehealth: Payer: Self-pay | Admitting: Emergency Medicine

## 2013-02-17 MED ORDER — ALBUTEROL SULFATE HFA 108 (90 BASE) MCG/ACT IN AERS: 2.0000 | INHALATION_SPRAY | Freq: Four times a day (QID) | RESPIRATORY_TRACT | Status: AC | PRN

## 2013-02-17 NOTE — Telephone Encounter (Signed)
Last OV 02/14/13: Patient Instructions      CXR today   Please take levaquin 500mg  daily for 10 days. PLEASE LET DR Marina GoodellPERRY KNOW SO YOUR COUMADIN CAN BE ADJUSTED We will try using albuterol 2 puffs up to every 4 hours with a spacer as needed for shortness of breath or to help clear mucous with your cough We will plan to repeat your CT scan in the Spring Follow with Dr Delton CoombesByrum in 4-6 weeks   Called and spoke with spouse. The albuterol inhaler was not called into the pharmacy. I have done so. Nothing further needed

## 2013-02-23 ENCOUNTER — Other Ambulatory Visit (HOSPITAL_COMMUNITY): Payer: Self-pay | Admitting: *Deleted

## 2013-02-24 ENCOUNTER — Encounter (HOSPITAL_COMMUNITY): Payer: Medicare Other

## 2013-02-24 ENCOUNTER — Encounter (HOSPITAL_COMMUNITY)
Admission: RE | Admit: 2013-02-24 | Discharge: 2013-02-24 | Disposition: A | Discharge status: Home / Self Care | Payer: Medicare Other | Source: Ambulatory Visit | Attending: Nephrology | Admitting: Nephrology

## 2013-02-24 DIAGNOSIS — D638 Anemia in other chronic diseases classified elsewhere: Secondary | ICD-10-CM | POA: Diagnosis present

## 2013-02-24 DIAGNOSIS — N184 Chronic kidney disease, stage 4 (severe): Secondary | ICD-10-CM | POA: Insufficient documentation

## 2013-02-24 LAB — POCT HEMOGLOBIN-HEMACUE: Hemoglobin: 9.5 g/dL — ABNORMAL LOW (ref 12.0–15.0)

## 2013-02-24 MED ORDER — EPOETIN ALFA 40000 UNIT/ML IJ SOLN: 30000.0000 [IU] | INTRAMUSCULAR | Status: DC

## 2013-02-24 MED ORDER — EPOETIN ALFA 10000 UNIT/ML IJ SOLN
INTRAMUSCULAR | Status: AC
  Administered 2013-02-24: 10000 [IU]
  Filled 2013-02-24: qty 1

## 2013-02-24 MED ORDER — EPOETIN ALFA 20000 UNIT/ML IJ SOLN
INTRAMUSCULAR | Status: AC
  Administered 2013-02-24: 20000 [IU]
  Filled 2013-02-24: qty 1

## 2013-02-24 MED ORDER — SODIUM CHLORIDE 0.9 % IV SOLN
1020.0000 mg | Freq: Once | INTRAVENOUS | Status: AC
  Administered 2013-02-24: 1020 mg via INTRAVENOUS
  Filled 2013-02-24: qty 34

## 2013-02-25 ENCOUNTER — Telehealth: Payer: Self-pay | Admitting: Emergency Medicine

## 2013-02-25 ENCOUNTER — Ambulatory Visit (INDEPENDENT_AMBULATORY_CARE_PROVIDER_SITE_OTHER): Payer: Medicare Other | Admitting: Cardiology

## 2013-02-25 ENCOUNTER — Ambulatory Visit (INDEPENDENT_AMBULATORY_CARE_PROVIDER_SITE_OTHER): Payer: Medicare Other | Admitting: Pulmonary Disease

## 2013-02-25 ENCOUNTER — Encounter: Payer: Self-pay | Admitting: Cardiology

## 2013-02-25 ENCOUNTER — Other Ambulatory Visit: Payer: Medicare Other

## 2013-02-25 ENCOUNTER — Encounter: Payer: Self-pay | Admitting: Pulmonary Disease

## 2013-02-25 VITALS — BP 110/50 | HR 68 | Ht 61.0 in | Wt 111.0 lb

## 2013-02-25 VITALS — BP 102/48 | HR 72 | Temp 97.9°F | Ht 62.0 in | Wt 105.2 lb

## 2013-02-25 DIAGNOSIS — R05 Cough: Secondary | ICD-10-CM

## 2013-02-25 DIAGNOSIS — J471 Bronchiectasis with (acute) exacerbation: Secondary | ICD-10-CM

## 2013-02-25 DIAGNOSIS — I4891 Unspecified atrial fibrillation: Secondary | ICD-10-CM

## 2013-02-25 DIAGNOSIS — I509 Heart failure, unspecified: Secondary | ICD-10-CM

## 2013-02-25 DIAGNOSIS — I080 Rheumatic disorders of both mitral and aortic valves: Secondary | ICD-10-CM

## 2013-02-25 DIAGNOSIS — I10 Essential (primary) hypertension: Secondary | ICD-10-CM

## 2013-02-25 DIAGNOSIS — R059 Cough, unspecified: Secondary | ICD-10-CM

## 2013-02-25 DIAGNOSIS — I5032 Chronic diastolic (congestive) heart failure: Secondary | ICD-10-CM

## 2013-02-25 MED ORDER — CIPROFLOXACIN HCL 500 MG PO TABS: 500.0000 mg | ORAL_TABLET | Freq: Two times a day (BID) | ORAL | Status: DC

## 2013-02-25 MED ORDER — HYDROCODONE-HOMATROPINE 5-1.5 MG/5ML PO SYRP: 5.0000 mL | ORAL_SOLUTION | Freq: Four times a day (QID) | ORAL | Status: DC | PRN

## 2013-02-25 NOTE — Telephone Encounter (Signed)
  Patient Instructions      CXR today   Please take levaquin 500mg  daily for 10 days. PLEASE LET DR Marina GoodellPERRY KNOW SO YOUR COUMADIN CAN BE ADJUSTED We will try using albuterol 2 puffs up to every 4 hours with a spacer as needed for shortness of breath or to help clear mucous with your cough We will plan to repeat your CT scan in the Spring Follow with Dr Delton CoombesByrum in 4-6 weeks   Called and spoke with daughter. She reports pt cough is much worse. She does not feel like pt is improving at all. Pt scheduled to come in and see KC at 1:45. Nothing further needed

## 2013-02-25 NOTE — Progress Notes (Signed)
HPI The patient presents for followup of atrial fibrillation. Since I last saw her she was in the hospital last summer and I reviewed this. She had pneumonia. She still being treated for this and just completed a course of antibiotics. She has followup with Cheryl [name] today. She has had no overt cardiovascular problems. She seems to be euvolemic. She has a dialysis fistula but has not yet started dialysis. She's not describing any new PND or orthopnea. She doesn't notice any symptomatic fibrillation. She has some bruising and some mild nosebleeds but no other problems with her anticoagulation. She is frail and weak he gets around in her home.  Allergies  Allergen Reactions  . Codeine Other (See Comments)    unknown  . Sulfa Antibiotics     unknown    Current Outpatient Prescriptions  Medication Sig Dispense Refill  . albuterol (PROVENTIL HFA;VENTOLIN HFA) 108 (90 BASE) MCG/ACT inhaler Inhale 2 puffs into the lungs every 6 (six) hours as needed for wheezing or shortness of breath.  1 Inhaler  6  . amLODipine (NORVASC) 5 MG tablet Take 5 mg by mouth daily.        . calcitRIOL (ROCALTROL) 0.25 MCG capsule Take 0.25 mcg by mouth daily.      . feeding supplement (ENSURE COMPLETE) LIQD Take 237 mLs by mouth daily.      . furosemide (LASIX) 40 MG tablet Take 40 mg by mouth daily.      [name]. levofloxacin (LEVAQUIN) 500 MG tablet Take 1 tablet (500 mg total) by mouth daily.  10 tablet  0  . levothyroxine (SYNTHROID, LEVOTHROID) 75 MCG tablet Take 75 mcg by mouth daily.        . Melatonin 5 MG TABS Take 5 mg by mouth at bedtime.       . metoprolol (LOPRESSOR) 100 MG tablet Take 1 tablet (100 mg total) by mouth 2 (two) times daily.  60 tablet  11  . Multiple Vitamin (MULTIVITAMIN WITH MINERALS) TABS Take 1 tablet by mouth daily. Centrum Silver      . omeprazole (PRILOSEC) 20 MG capsule Take 20 mg by mouth daily.        [name]. oxybutynin (DITROPAN-XL) 10 MG 24 hr tablet Take 10 mg by mouth daily.      .  polyethylene glycol (MIRALAX / GLYCOLAX) packet Take 17 g by mouth daily.      . sertraline (ZOLOFT) 25 MG tablet Take 25 mg by mouth daily.      [name]. Spacer/Aero-Holding Chambers (AEROCHAMBER PLUS) inhaler Use as instructed  1 each  0  . warfarin (COUMADIN) 3 MG tablet Take 3 mg by mouth See admin instructions. Take 1 tablet on Sun, Tues, Wed, Thurs, Sat       No current facility-administered medications for this visit.    Past Medical History  Diagnosis Date  . HTN (hypertension)     2 YEARS  . Hypothyroidism   . Temporal arteritis   . Pulmonary embolism 1997  . Mitral regurgitation     a. mild by 01/2010 echo.  . Chronic diastolic CHF (congestive heart failure)     a. 01/2010 Echo: EF 60%, mild LVH, High LV filling pressures, No RWMA, mild MR, PASP 37mmHg.  Cheryl [name]. Cataracta   . Chronic anemia   . Paroxysmal atrial fibrillation   . Pulmonary HTN     a. PASP 37mmHg by 01/2010 echo.  . S/P cardiac cath     a. 12/2009 R Heart Cath:  RA  12, RV 45/11, PA 47/21, PCWP MEAN 26, CO 4.56, CI 2.7.  . Ischemia     a. 12/2009 Myoview: Ant ischemia, EF 75%----NO CATH DONE DUE TO CKD  . End stage renal disease     a. s/p AVF - not on dialysis.  [name] History of urinary frequency   . GERD (gastroesophageal reflux disease)   . Sleep trouble   . Constipation, chronic   . Hard of hearing   . Complication of anesthesia   . PONV (postoperative nausea and vomiting)   . Pneumonia 09/15/2012    Past Surgical History  Procedure Laterality Date  . Temporal arteny bx    . Cataract surgery    . Eye surgery    . Abdominal hysterectomy    . Intramedullary (im) nail intertrochanteric Left 06/30/2012    Procedure: INTRAMEDULLARY (IM) NAIL INTERTROCHANTRIC LEFT;  Surgeon: Cheryl Junior, MD;  Location: [facility] OR;  Service: Orthopedics;  Laterality: Left;  . Injection knee Left 06/30/2012    Procedure: Left Knee Aspiration with Intra-Articular Injection;  Surgeon: Cheryl Junior, MD;  Location: [facility] OR;  Service:  Orthopedics;  Laterality: Left;  . Video bronchoscopy Bilateral 09/21/2012    Procedure: VIDEO BRONCHOSCOPY WITHOUT FLUORO;  Surgeon: Cheryl Peer, MD;  Location: [facility] ENDOSCOPY;  Service: Cardiopulmonary;  Laterality: Bilateral;    ROS:  Anxious, insomnia.  Otherwise as stated in the HPI and negative for all other systems.  PHYSICAL EXAM BP 110/50  Pulse 68  Ht 5\' 1"  (1.549 m)  Wt 111 lb (50.349 kg)  BMI 20.98 kg/m2 GENERAL:  Frail appearing HEENT:  Pupils equal round and reactive, fundi not visualized, oral mucosa unremarkable, upper dentures NECK:  No jugular venous distention, waveform within normal limits, carotid upstroke brisk and symmetric, no bruits, no thyromegaly LYMPHATICS:  No cervical, inguinal adenopathy LUNGS:  Clear to auscultation bilaterally BACK:  No CVA tenderness CHEST:  Unremarkable HEART:  PMI not displaced or sustained,S1 and S2 within normal limits, no S3, no S4, no clicks, no rubs, apical 3/6 holosystolic murmur without diastolic murmurs ABD:  Flat, positive bowel sounds normal in frequency in pitch, no bruits, no rebound, no guarding, no midline pulsatile mass, no hepatomegaly, no splenomegaly EXT:  2 plus pulses throughout, mild to moderate left greater than right lower strandy edema, no cyanosis no clubbing, left upper arm fistula SKIN:  No rashes no nodules  EKG:  Sinus rhythm, rate 68, axis within normal limits, incomplete right bundle branch block, premature atrial contractions, no acute ST-T wave changes. 02/25/2013  ASSESSMENT AND PLAN  ATRIAL FIBRILLATION -  Since I last saw her she is no longer having symptomatic paroxysms of atrial fibrillation. Therefore, no change in therapy is indicated. She'll continue beta blocker. [name] has a CHA2DS2 - VASc score of 4 with a risk of stroke of 4%  and a HAS - BLED score of 2.  She will continue on the anticoagulation.  MITRAL REGURGITATION -  This was mild at the last echo.  She is not a  candidate for aggressive therapy.  Therefore, I will not follow up with an echo.    HYPERTENSION, BENIGN -  Her blood pressure is controlled. She will continue the medicines as listed.  PULMONARY HYPERTENSION -  She has mild pulmonary hypertension by echo. She has no new symptoms.  No change in therapy is indicated.

## 2013-02-25 NOTE — Patient Instructions (Signed)
Stop levaquin if you are still taking. Will start on ciprofloxacin 500mg  one am and pm for next 10 days Will call you with sputum culture results once available.  Will send in prescription for hydromet cough syrup, one teaspoon every 6 hrs if needed.  followup with Dr. Delton CoombesByrum or NP in one week to check on progress.

## 2013-02-25 NOTE — Progress Notes (Signed)
   Subjective:    Patient ID: Cheryl [name], female    DOB: 11/07/1925, 80 y.o.   MRN: [id number]  HPI The patient comes in today for an acute sick visit. She has known bronchiectasis with right middle lobe syndrome, and grew Enterobacter and MRSA at bronchoscopy last year. She was seen in the office at the end of January with increasing symptoms, and a chest x-ray showed a pneumonic infiltrate in the right upper lobe. She has been treated with a ten-day course of Levaquin that is near completion, but she has continued to have significant cough and purulent mucus. Her cough is very bothersome to her at this time. She denies any fevers, chills, or sweats. Her breathing is near her usual baseline, and denies worsening.   Review of Systems  Constitutional: Negative for fever and unexpected weight change.  HENT: Positive for congestion ( chest congestion). Negative for dental problem, ear pain, nosebleeds, postnasal drip, rhinorrhea, sinus pressure, sneezing, sore throat and trouble swallowing.   Eyes: Negative for redness and itching.  Respiratory: Positive for cough and wheezing. Negative for chest tightness and shortness of breath.   Cardiovascular: Negative for palpitations and leg swelling.  Gastrointestinal: Negative for nausea and vomiting.  Genitourinary: Negative for dysuria.  Musculoskeletal: Negative for joint swelling.  Skin: Negative for rash.  Neurological: Negative for headaches.  Hematological: Does not bruise/bleed easily.  Psychiatric/Behavioral: Negative for dysphoric mood. The patient is not nervous/anxious.        Objective:   Physical Exam Frail-appearing female in no acute distress Nose without purulence or discharge noted Oropharynx clear Neck without lymphadenopathy or thyromegaly Chest with scattered crackles, a few rhonchi, no definite wheezes. Cardiac exam with regular rate and rhythm Lower extremities with minimal edema, no cyanosis Alert and oriented,  moves all 4 extremities.       Assessment & Plan:

## 2013-02-25 NOTE — Patient Instructions (Signed)
Your physician recommends that you continue on your current medications as directed. Please refer to the Current Medication list given to you today.  Your physician wants you to follow-up in: 1 year ov You will receive a reminder letter in the mail two months in advance. If you don't receive a letter, please call our office to schedule the follow-up appointment.  

## 2013-02-25 NOTE — Assessment & Plan Note (Signed)
The patient is continuing to have a congested cough despite being treated with a course of Levaquin. I suspect that we are dealing with resistant organisms, and we'll therefore treat her with a course of Cipro for her past history of Enterobacter. If we are dealing with MRSA, she will either need  oral Zyvox or IV vancomycin.  Will also get a sputum specimen today in the event she has a change in her colonizing flora, or a change in her resistance pattern.  She had increased infiltration on chest x-ray at the last visit, and I would like to see her back next week with a followup chest x-ray to make sure things are improving. At least she is not having increased shortness of breath at this time.

## 2013-02-28 LAB — RESPIRATORY CULTURE OR RESPIRATORY AND SPUTUM CULTURE

## 2013-03-03 ENCOUNTER — Encounter (HOSPITAL_COMMUNITY)
Admission: RE | Admit: 2013-03-03 | Discharge: 2013-03-03 | Disposition: A | Discharge status: Home / Self Care | Payer: Medicare Other | Source: Ambulatory Visit | Attending: Nephrology | Admitting: Nephrology

## 2013-03-03 DIAGNOSIS — D638 Anemia in other chronic diseases classified elsewhere: Secondary | ICD-10-CM | POA: Diagnosis not present

## 2013-03-03 LAB — POCT HEMOGLOBIN-HEMACUE: HEMOGLOBIN: 9.5 g/dL — AB (ref 12.0–15.0)

## 2013-03-03 MED ORDER — EPOETIN ALFA 10000 UNIT/ML IJ SOLN
INTRAMUSCULAR | Status: AC
  Administered 2013-03-03: 10000 [IU]
  Filled 2013-03-03: qty 1

## 2013-03-03 MED ORDER — EPOETIN ALFA 40000 UNIT/ML IJ SOLN: 30000.0000 [IU] | INTRAMUSCULAR | Status: DC

## 2013-03-03 MED ORDER — EPOETIN ALFA 20000 UNIT/ML IJ SOLN
INTRAMUSCULAR | Status: AC
  Administered 2013-03-03: 20000 [IU]
  Filled 2013-03-03: qty 1

## 2013-03-07 ENCOUNTER — Ambulatory Visit: Payer: Medicare Other | Admitting: Emergency Medicine

## 2013-03-09 ENCOUNTER — Other Ambulatory Visit (INDEPENDENT_AMBULATORY_CARE_PROVIDER_SITE_OTHER): Payer: Medicare Other

## 2013-03-09 ENCOUNTER — Encounter (HOSPITAL_COMMUNITY): Payer: Self-pay | Admitting: *Deleted

## 2013-03-09 ENCOUNTER — Ambulatory Visit (INDEPENDENT_AMBULATORY_CARE_PROVIDER_SITE_OTHER): Payer: Medicare Other | Admitting: Emergency Medicine

## 2013-03-09 ENCOUNTER — Encounter: Payer: Self-pay | Admitting: Emergency Medicine

## 2013-03-09 ENCOUNTER — Inpatient Hospital Stay (HOSPITAL_COMMUNITY)
Admission: AD | Admit: 2013-03-09 | Discharge: 2013-03-19 | DRG: 177 | Disposition: A | Discharge status: Hospice | Payer: Medicare Other | Source: Ambulatory Visit | Attending: Emergency Medicine | Admitting: Emergency Medicine

## 2013-03-09 ENCOUNTER — Inpatient Hospital Stay (HOSPITAL_COMMUNITY): Payer: Medicare Other

## 2013-03-09 ENCOUNTER — Telehealth: Payer: Self-pay | Admitting: Emergency Medicine

## 2013-03-09 VITALS — BP 96/54 | HR 69 | Ht 62.0 in | Wt 114.8 lb

## 2013-03-09 DIAGNOSIS — D649 Anemia, unspecified: Secondary | ICD-10-CM

## 2013-03-09 DIAGNOSIS — I509 Heart failure, unspecified: Secondary | ICD-10-CM | POA: Diagnosis present

## 2013-03-09 DIAGNOSIS — R634 Abnormal weight loss: Secondary | ICD-10-CM | POA: Diagnosis present

## 2013-03-09 DIAGNOSIS — J15212 Pneumonia due to Methicillin resistant Staphylococcus aureus: Principal | ICD-10-CM

## 2013-03-09 DIAGNOSIS — N259 Disorder resulting from impaired renal tubular function, unspecified: Secondary | ICD-10-CM

## 2013-03-09 DIAGNOSIS — N318 Other neuromuscular dysfunction of bladder: Secondary | ICD-10-CM | POA: Diagnosis present

## 2013-03-09 DIAGNOSIS — Z22322 Carrier or suspected carrier of Methicillin resistant Staphylococcus aureus: Secondary | ICD-10-CM

## 2013-03-09 DIAGNOSIS — R040 Epistaxis: Secondary | ICD-10-CM

## 2013-03-09 DIAGNOSIS — N289 Disorder of kidney and ureter, unspecified: Secondary | ICD-10-CM

## 2013-03-09 DIAGNOSIS — I480 Paroxysmal atrial fibrillation: Secondary | ICD-10-CM

## 2013-03-09 DIAGNOSIS — E039 Hypothyroidism, unspecified: Secondary | ICD-10-CM | POA: Diagnosis present

## 2013-03-09 DIAGNOSIS — Z86711 Personal history of pulmonary embolism: Secondary | ICD-10-CM

## 2013-03-09 DIAGNOSIS — N186 End stage renal disease: Secondary | ICD-10-CM

## 2013-03-09 DIAGNOSIS — J309 Allergic rhinitis, unspecified: Secondary | ICD-10-CM

## 2013-03-09 DIAGNOSIS — K219 Gastro-esophageal reflux disease without esophagitis: Secondary | ICD-10-CM | POA: Diagnosis present

## 2013-03-09 DIAGNOSIS — F3289 Other specified depressive episodes: Secondary | ICD-10-CM | POA: Diagnosis present

## 2013-03-09 DIAGNOSIS — D689 Coagulation defect, unspecified: Secondary | ICD-10-CM

## 2013-03-09 DIAGNOSIS — N039 Chronic nephritic syndrome with unspecified morphologic changes: Secondary | ICD-10-CM

## 2013-03-09 DIAGNOSIS — I10 Essential (primary) hypertension: Secondary | ICD-10-CM

## 2013-03-09 DIAGNOSIS — R531 Weakness: Secondary | ICD-10-CM

## 2013-03-09 DIAGNOSIS — I5032 Chronic diastolic (congestive) heart failure: Secondary | ICD-10-CM

## 2013-03-09 DIAGNOSIS — G4700 Insomnia, unspecified: Secondary | ICD-10-CM | POA: Diagnosis present

## 2013-03-09 DIAGNOSIS — K5909 Other constipation: Secondary | ICD-10-CM

## 2013-03-09 DIAGNOSIS — R609 Edema, unspecified: Secondary | ICD-10-CM | POA: Diagnosis present

## 2013-03-09 DIAGNOSIS — I120 Hypertensive chronic kidney disease with stage 5 chronic kidney disease or end stage renal disease: Secondary | ICD-10-CM | POA: Diagnosis present

## 2013-03-09 DIAGNOSIS — J479 Bronchiectasis, uncomplicated: Secondary | ICD-10-CM

## 2013-03-09 DIAGNOSIS — Z515 Encounter for palliative care: Secondary | ICD-10-CM

## 2013-03-09 DIAGNOSIS — R630 Anorexia: Secondary | ICD-10-CM

## 2013-03-09 DIAGNOSIS — E43 Unspecified severe protein-calorie malnutrition: Secondary | ICD-10-CM

## 2013-03-09 DIAGNOSIS — R0602 Shortness of breath: Secondary | ICD-10-CM | POA: Diagnosis not present

## 2013-03-09 DIAGNOSIS — E871 Hypo-osmolality and hyponatremia: Secondary | ICD-10-CM

## 2013-03-09 DIAGNOSIS — R0902 Hypoxemia: Secondary | ICD-10-CM | POA: Diagnosis not present

## 2013-03-09 DIAGNOSIS — R911 Solitary pulmonary nodule: Secondary | ICD-10-CM

## 2013-03-09 DIAGNOSIS — R5383 Other fatigue: Secondary | ICD-10-CM

## 2013-03-09 DIAGNOSIS — E875 Hyperkalemia: Secondary | ICD-10-CM

## 2013-03-09 DIAGNOSIS — Z8249 Family history of ischemic heart disease and other diseases of the circulatory system: Secondary | ICD-10-CM

## 2013-03-09 DIAGNOSIS — R002 Palpitations: Secondary | ICD-10-CM

## 2013-03-09 DIAGNOSIS — I2789 Other specified pulmonary heart diseases: Secondary | ICD-10-CM

## 2013-03-09 DIAGNOSIS — I080 Rheumatic disorders of both mitral and aortic valves: Secondary | ICD-10-CM

## 2013-03-09 DIAGNOSIS — R9439 Abnormal result of other cardiovascular function study: Secondary | ICD-10-CM

## 2013-03-09 DIAGNOSIS — Z7901 Long term (current) use of anticoagulants: Secondary | ICD-10-CM

## 2013-03-09 DIAGNOSIS — I2510 Atherosclerotic heart disease of native coronary artery without angina pectoris: Secondary | ICD-10-CM | POA: Diagnosis present

## 2013-03-09 DIAGNOSIS — T502X5A Adverse effect of carbonic-anhydrase inhibitors, benzothiadiazides and other diuretics, initial encounter: Secondary | ICD-10-CM | POA: Diagnosis present

## 2013-03-09 DIAGNOSIS — J471 Bronchiectasis with (acute) exacerbation: Secondary | ICD-10-CM

## 2013-03-09 DIAGNOSIS — Z8614 Personal history of Methicillin resistant Staphylococcus aureus infection: Secondary | ICD-10-CM

## 2013-03-09 DIAGNOSIS — D631 Anemia in chronic kidney disease: Secondary | ICD-10-CM | POA: Diagnosis present

## 2013-03-09 DIAGNOSIS — I2699 Other pulmonary embolism without acute cor pulmonale: Secondary | ICD-10-CM

## 2013-03-09 DIAGNOSIS — R300 Dysuria: Secondary | ICD-10-CM

## 2013-03-09 DIAGNOSIS — M949 Disorder of cartilage, unspecified: Secondary | ICD-10-CM

## 2013-03-09 DIAGNOSIS — IMO0002 Reserved for concepts with insufficient information to code with codable children: Secondary | ICD-10-CM

## 2013-03-09 DIAGNOSIS — Z66 Do not resuscitate: Secondary | ICD-10-CM | POA: Diagnosis present

## 2013-03-09 DIAGNOSIS — T83091A Other mechanical complication of indwelling urethral catheter, initial encounter: Secondary | ICD-10-CM | POA: Diagnosis not present

## 2013-03-09 DIAGNOSIS — H9190 Unspecified hearing loss, unspecified ear: Secondary | ICD-10-CM

## 2013-03-09 DIAGNOSIS — M316 Other giant cell arteritis: Secondary | ICD-10-CM | POA: Diagnosis present

## 2013-03-09 DIAGNOSIS — Y846 Urinary catheterization as the cause of abnormal reaction of the patient, or of later complication, without mention of misadventure at the time of the procedure: Secondary | ICD-10-CM | POA: Diagnosis not present

## 2013-03-09 DIAGNOSIS — S72142A Displaced intertrochanteric fracture of left femur, initial encounter for closed fracture: Secondary | ICD-10-CM

## 2013-03-09 DIAGNOSIS — K5900 Constipation, unspecified: Secondary | ICD-10-CM | POA: Diagnosis present

## 2013-03-09 DIAGNOSIS — M899 Disorder of bone, unspecified: Secondary | ICD-10-CM | POA: Diagnosis present

## 2013-03-09 DIAGNOSIS — N179 Acute kidney failure, unspecified: Secondary | ICD-10-CM | POA: Diagnosis present

## 2013-03-09 DIAGNOSIS — R627 Adult failure to thrive: Secondary | ICD-10-CM | POA: Diagnosis present

## 2013-03-09 DIAGNOSIS — R5381 Other malaise: Secondary | ICD-10-CM | POA: Diagnosis present

## 2013-03-09 DIAGNOSIS — I4891 Unspecified atrial fibrillation: Secondary | ICD-10-CM

## 2013-03-09 DIAGNOSIS — F329 Major depressive disorder, single episode, unspecified: Secondary | ICD-10-CM | POA: Diagnosis present

## 2013-03-09 DIAGNOSIS — I272 Pulmonary hypertension, unspecified: Secondary | ICD-10-CM

## 2013-03-09 LAB — PROTIME-INR
INR: 2.96 — AB (ref 0.00–1.49)
Prothrombin Time: 29.8 seconds — ABNORMAL HIGH (ref 11.6–15.2)

## 2013-03-09 LAB — BASIC METABOLIC PANEL
BUN: 52 mg/dL — ABNORMAL HIGH (ref 6–23)
CO2: 27 mEq/L (ref 19–32)
Calcium: 9.5 mg/dL (ref 8.4–10.5)
Chloride: 80 mEq/L — ABNORMAL LOW (ref 96–112)
Creatinine, Ser: 3.2 mg/dL — ABNORMAL HIGH (ref 0.4–1.2)
GFR: 14.76 mL/min — CL (ref >60.00)
Glucose, Bld: 100 mg/dL — ABNORMAL HIGH (ref 70–99)
POTASSIUM: 4.9 meq/L (ref 3.5–5.1)
SODIUM: 119 meq/L — AB (ref 135–145)

## 2013-03-09 MED ORDER — VANCOMYCIN HCL IN DEXTROSE 1-5 GM/200ML-% IV SOLN
1000.0000 mg | Freq: Once | INTRAVENOUS | Status: AC
  Administered 2013-03-09: 1000 mg via INTRAVENOUS
  Filled 2013-03-09: qty 200

## 2013-03-09 MED ORDER — CALCITRIOL 0.25 MCG PO CAPS
0.2500 ug | ORAL_CAPSULE | Freq: Every day | ORAL | Status: DC
  Administered 2013-03-10 – 2013-03-19 (×10): 0.25 ug via ORAL
  Filled 2013-03-09 (×11): qty 1

## 2013-03-09 MED ORDER — PANTOPRAZOLE SODIUM 40 MG PO TBEC
40.0000 mg | DELAYED_RELEASE_TABLET | Freq: Every day | ORAL | Status: DC
  Administered 2013-03-10 – 2013-03-19 (×10): 40 mg via ORAL
  Filled 2013-03-09 (×11): qty 1

## 2013-03-09 MED ORDER — OXYBUTYNIN CHLORIDE ER 10 MG PO TB24
10.0000 mg | ORAL_TABLET | Freq: Every day | ORAL | Status: DC
  Administered 2013-03-10 – 2013-03-18 (×9): 10 mg via ORAL
  Filled 2013-03-09 (×11): qty 1

## 2013-03-09 MED ORDER — ENSURE PUDDING PO PUDG
1.0000 | Freq: Three times a day (TID) | ORAL | Status: DC
  Administered 2013-03-10 – 2013-03-19 (×19): 1 via ORAL
  Filled 2013-03-09 (×34): qty 1

## 2013-03-09 MED ORDER — LEVOTHYROXINE SODIUM 75 MCG PO TABS
75.0000 ug | ORAL_TABLET | Freq: Every day | ORAL | Status: DC
  Administered 2013-03-10 – 2013-03-19 (×10): 75 ug via ORAL
  Filled 2013-03-09 (×16): qty 1

## 2013-03-09 MED ORDER — TRAZODONE 25 MG HALF TABLET
25.0000 mg | ORAL_TABLET | Freq: Every evening | ORAL | Status: DC | PRN
  Administered 2013-03-09 – 2013-03-18 (×8): 25 mg via ORAL
  Filled 2013-03-09 (×8): qty 1

## 2013-03-09 MED ORDER — ACETAMINOPHEN 325 MG PO TABS
650.0000 mg | ORAL_TABLET | ORAL | Status: DC | PRN
  Administered 2013-03-09: 650 mg via ORAL
  Filled 2013-03-09: qty 2

## 2013-03-09 MED ORDER — ADULT MULTIVITAMIN W/MINERALS CH
1.0000 | ORAL_TABLET | Freq: Every day | ORAL | Status: DC
  Administered 2013-03-10 – 2013-03-19 (×10): 1 via ORAL
  Filled 2013-03-09 (×12): qty 1

## 2013-03-09 MED ORDER — ALBUTEROL SULFATE (2.5 MG/3ML) 0.083% IN NEBU
2.5000 mg | INHALATION_SOLUTION | RESPIRATORY_TRACT | Status: DC | PRN
  Administered 2013-03-11 (×2): 2.5 mg via RESPIRATORY_TRACT
  Filled 2013-03-09 (×2): qty 3

## 2013-03-09 MED ORDER — METOPROLOL TARTRATE 100 MG PO TABS
100.0000 mg | ORAL_TABLET | Freq: Two times a day (BID) | ORAL | Status: DC
  Administered 2013-03-09 – 2013-03-14 (×10): 100 mg via ORAL
  Filled 2013-03-09 (×14): qty 1

## 2013-03-09 MED ORDER — AMLODIPINE BESYLATE 5 MG PO TABS
5.0000 mg | ORAL_TABLET | Freq: Every day | ORAL | Status: DC
  Administered 2013-03-10 – 2013-03-14 (×5): 5 mg via ORAL
  Filled 2013-03-09 (×6): qty 1

## 2013-03-09 MED ORDER — SODIUM CHLORIDE 0.9 % IV SOLN
INTRAVENOUS | Status: DC
Start: 2013-03-09 — End: 2013-03-11
  Administered 2013-03-09 – 2013-03-10 (×2): via INTRAVENOUS
  Administered 2013-03-10: 1000 mL via INTRAVENOUS

## 2013-03-09 MED ORDER — LINEZOLID 600 MG PO TABS: 600.0000 mg | ORAL_TABLET | Freq: Two times a day (BID) | ORAL | Status: DC

## 2013-03-09 MED ORDER — SERTRALINE HCL 25 MG PO TABS
25.0000 mg | ORAL_TABLET | Freq: Every day | ORAL | Status: DC
  Administered 2013-03-10 – 2013-03-14 (×5): 25 mg via ORAL
  Filled 2013-03-09 (×7): qty 1

## 2013-03-09 NOTE — Progress Notes (Signed)
HPI:  80 y/o female [facility] pneumonia unresponsive to antibiotics as an outpatient; a CT chest was concerning for an endobronchial lesion causing post obstructive pneumonia. She underwent non-dx FOB on 09/21/12.   8/27 CT chest >> complete occlusion of the RML bronchus with dense RML and RLL consolidation and interlobular septal thickening; enlarge paratracheal lymph nodes  9/2 FOB>> single focus abnormal glandular tissue, unable to definitively call malignancy  She is having cough, copious thick mucous, no hemoptysis  ROV 12/30/12 -- follows up for hx PNA and bronchiectasis. Treated last time with 2 more weeks of abx > doxy. She returns today for f/u reports that her cough is much improved. Color of the sputum has cleared.   ROV 02/14/13 -- follows up for bronchiectasis and RML scar, chronic cough. Since last visit her cough has increased, purulent sputum. Mostly in the am, but can happen all day. She has stable exertional SOB.   ROV 03/09/13 -- f/u visit for bronchiectasis, hx RML syndrome and cough.  She saw Dr Shelle Ironlance on 2/6 for sx consistent with an acute flare. She has grown enterobacter in the past, most recent cx data shows MRSA but no Enterobacter. She was treated with cipro empirically. She is having more LE edema for 2-3 days. She is on lasix 40mg  bid. Continues to cough dark sputum.      Filed Vitals:   03/09/13 1015  BP: 96/54  Pulse: 69  Height: 5\' 2"  (1.575 m)  Weight: 114 lb 12.8 oz (52.073 kg)  SpO2: 91%   Gen: Pleasant, well-nourished, in no distress, kyphotic  ENT: No lesions,  mouth clear,  oropharynx clear, no postnasal drip  Neck: No JVD, no TMG, no carotid bruits  Lungs: No use of accessory muscles, clear without rales or rhonchi  Cardiovascular: RRR, heart sounds normal, no murmur or gallops, no peripheral edema  Musculoskeletal: No deformities, no cyanosis or clubbing  Neuro: alert, non focal, poor memory, evidence  for dementia  Skin: Warm, no lesions or rashes   11/30/12 --  COMPARISON: 10/16/2012  FINDINGS:  There is no pleural effusion identified. There has been interval  resolution of right middle lobe airspace consolidation. Multifocal  areas of nodularity identified throughout the right lung. This  predominantly affects the right middle lobe and right lower lobe.  Many of these nodules have a stress set mini of these nodules are  peripheral in location and have a tree-in-bud configuration. Areas  of bronchiectasis are noted within the right middle lobe and right  base. Mild atelectasis and bronchial wall thickening is noted within  the left upper lobe and superior portion of the left lower lobe.  There is a new nodular area of consolidation within the right lower  lobe, image 24/series 3.  The trachea appears patent. There is abnormal wall thickening  involving the bronchi, right greater than left. The heart size is  within normal limits. No significant pericardial effusion  identified. Persistent enlarged mediastinal lymph nodes. The right  paratracheal lymph node measures 1 cm, image 18/ series 2.  Previously 1.3 cm. Sub- carinal lymph node measures 1.3 cm, image  23/series 2. Previously this measured the same.  No axillary or supraclavicular lymph nodes.  Incidental imaging through the upper abdomen is on unremarkable. No  worrisome lytic or sclerotic bone lesions identified.  IMPRESSION:  1. Interval improvement in right middle lobe airspace consolidation.  Findings are likely the sequelae of inflammation or infection.  2. Persistent scattered areas of peripheral predominant tree-in-bud  nodularity involving the right midlung and right base. There are  also areas of bronchiectasis noted bilaterally. Findings are favored  to represent sequelae of chronic, atypical indolent infection.  3. New nodular density within the right lower lobe is also likely  postinflammatory infectious in  etiology.  4. Persistent enlarged mediastinal lymph nodes which in the setting  of infection are likely reactive. Metastatic adenopathy is less  favored.  5. Calcified atherosclerotic disease.  Bronchiectasis without acute exacerbation She continues to have dyspnea and purulent cough. She was treated for presumed enterobacter, sputum shows residual MRSA. I favor treating PO with linezolid, will order today for 2 weeks. Shouldn't be affected by her renal insufficiency.   Of Note I discussed with her today the fact that she has several chronic progressive medical problems and that she would do poorly if she were to have an acute decompensation of any of these. She and her daughter agree that she would not want CPR, MV or life support under such circumstances. I will indicate this in the chart and give her a durable DNR form. She is still working with Dr Allena Katz regarding possible HD and still wants to consider this if needed (or able to be tolerated).   ESRD (end stage renal disease) Followed closely by Dr Allena Katz. She is having more waxing waning edema. Not clear to me that she is profoundly overloaded at this time. She is planning to see him next month. Will check S Cr today, follow with her to see if she needs to move that appointment up. She is still willing to consider HD

## 2013-03-09 NOTE — Progress Notes (Signed)
ANTIBIOTIC CONSULT NOTE - INITIAL  Pharmacy Consult for Vancomycin Indication: MRSA PNA  Allergies  Allergen Reactions  . Codeine Other (See Comments)    unknown  . Sulfa Antibiotics     unknown   Patient Measurements: Height: 5\' 2"  (157.5 cm) Weight: 112 lb (50.803 kg) IBW/kg (Calculated) : 50.1  Vital Signs: Temp: 97.9 F (36.6 C) (02/18 1600) Temp src: Oral (02/18 1600) BP: 107/40 mmHg (02/18 1600) Pulse Rate: 57 (02/18 1600) Intake/Output from previous day:   Intake/Output from this shift:    Labs:  Recent Labs  03/09/13 1102  CREATININE 3.2*   Estimated Creatinine Clearance: 10 ml/min (by C-G formula based on Cr of 3.2). No results found for this basename: VANCOTROUGH, Leodis Binet, VANCORANDOM, GENTTROUGH, GENTPEAK, GENTRANDOM, TOBRATROUGH, TOBRAPEAK, TOBRARND, AMIKACINPEAK, AMIKACINTROU, AMIKACIN,  in the last 72 hours   Microbiology: Recent Results (from the past 720 hour(s))  RESPIRATORY CULTURE OR RESPIRATORY AND SPUTUM CULTURE     Status: None   Collection Time    02/25/13  4:20 PM      Result Value Ref Range Status   Culture     Final   Value: Abundant METHICILLIN RESISTANT STAPHYLOCOCCUS AUREUS   Gram Stain Moderate   Final   Gram Stain WBC present-predominately PMN   Final   Gram Stain Rare Squamous Epithelial Cells Present   Final   Gram Stain Moderate Yeast   Final   Gram Stain     Final   Value: Abundant GRAM POSITIVE COCCI IN PAIRS In Clusters In Chains   Gram Stain Few Gram Positive Rods   Final   Organism ID, Bacteria METHICILLIN RESISTANT STAPHYLOCOCCUS AUREUS   Final   Comment: Rifampin and Gentamicin should not be used as     single drugs for treatment of Staph infections.  AFB CULTURE WITH SMEAR     Status: None   Collection Time    02/25/13  4:20 PM      Result Value Ref Range Status   Preliminary Report Culture will be examined for 6 weeks before    Preliminary   Preliminary Report issuing a Final Report.   Preliminary   ACID FAST  SMEAR No Acid Fast Bacilli Seen   Preliminary   Medical History: Past Medical History  Diagnosis Date  . HTN (hypertension)     2 YEARS  . Hypothyroidism   . Temporal arteritis   . Pulmonary embolism 1997  . Mitral regurgitation     a. mild by 01/2010 echo.  . Chronic diastolic CHF (congestive heart failure)     a. 01/2010 Echo: EF 60%, mild LVH, High LV filling pressures, No RWMA, mild MR, PASP .  Arman Bogus   . Chronic anemia   . Paroxysmal atrial fibrillation   . Pulmonary HTN     a. PASP by 01/2010 echo.  . S/P cardiac cath     a. 12/2009 R Heart Cath:  RA 12, RV 45/11, PA 47/21, PCWP MEAN 26, CO 4.56, CI 2.7.  . Ischemia     a. 12/2009 Myoview: Ant ischemia, EF 75%----NO CATH DONE DUE TO CKD  . End stage renal disease     a. s/p AVF - not on dialysis.  [name] History of urinary frequency   . GERD (gastroesophageal reflux disease)   . Sleep trouble   . Constipation, chronic   . Hard of hearing   . Complication of anesthesia   . PONV (postoperative nausea and vomiting)   . Pneumonia 09/15/2012  Medications:  Scheduled:  . [START ON 03/10/2013] amLODipine  5 mg Oral Daily  . [START ON 03/10/2013] calcitRIOL  0.25 mcg Oral Daily  . feeding supplement (ENSURE)  1 Container Oral TID BM  . [START ON 03/10/2013] levothyroxine  75 mcg Oral QAC breakfast  . metoprolol tartrate  100 mg Oral BID  . [START ON 03/10/2013] multivitamin with minerals  1 tablet Oral Daily  . [START ON 03/10/2013] oxybutynin  10 mg Oral QHS  . [START ON 03/10/2013] pantoprazole  40 mg Oral Daily  . [START ON 03/10/2013] sertraline  25 mg Oral Daily  . vancomycin  1,000 mg Intravenous Once   Anti-infectives   Start     Dose/Rate Route Frequency Ordered Stop   03/09/13 1730  vancomycin (VANCOCIN) IVPB 1000 mg/200 mL premix     1,000 mg 200 mL/hr over 60 Minutes Intravenous  Once 03/09/13 1649       Assessment: 88 yoF with MRSA PNA, culture obtained 2/6 @ [facility]. Planned treatment with po  Linezolid, but patient could not afford. [facility]. Hx of ESRD. Pharmacy to dose Vancomycin  Clearance ~ 10 ml/min  MRSA sputum cx: sensitive to Clindamycin, Tetracycline, Linezolid, Gentamicin, Vancomycin  Goal of Therapy:  Vancomycin trough level 15-20 mcg/ml  Plan:   Vancomycin 1000mg  x1 dose  Obtain random level in am to assess clearance for timing of next dose  Otho BellowsGreen, [place] L PharmD Pager [phone] 03/09/2013, 5:55 PM

## 2013-03-09 NOTE — Telephone Encounter (Signed)
[facility]  Pt's daughter aware and bringing her to out pt admitting. This is due to medication not affordable and needs treatment for MRSA Nothing further is needed

## 2013-03-09 NOTE — Patient Instructions (Signed)
Lab work today. We will call you with the results and forward to Dr Allena KatzPatel.  Take linezolid 600mg  twice a day for 2 weeks.  We filled out a durable DNR order for you today. Please place this in a conspicuous location in your home so that caregivers can see it is in effect.  Follow with Dr Delton CoombesByrum in 1 month

## 2013-03-09 NOTE — Procedures (Signed)
Central Venous Catheter Insertion Procedure Note Aileen FassMargel L Urbach [id number] 02/24/1925  Procedure: Insertion of Central Venous Catheter Indications: Assessment of intravascular volume, Drug and/or fluid administration and Frequent blood sampling  Procedure Details Consent: Risks of procedure as well as the alternatives and risks of each were explained to the (patient/caregiver).  Consent for procedure obtained. Time Out: Verified patient identification, verified procedure, site/side was marked, verified correct patient position, special equipment/implants available, medications/allergies/relevent history reviewed, required imaging and test results available.  Performed  Maximum sterile technique was used including antiseptics, cap, gloves, gown, hand hygiene, mask and sheet. Skin prep: Chlorhexidine; local anesthetic administered A antimicrobial bonded/coated triple lumen catheter was placed in the left internal jugular vein using the Seldinger technique.  Evaluation Blood flow good Complications: No apparent complications Patient did tolerate procedure well. Chest X-ray ordered to verify placement.  CXR: pending.  Nelda BucksFEINSTEIN,Chela Sutphen J. 03/09/2013, 7:32 PM  Unable to do rt, small IJ  US guidnace Unable to ffp, no access  [employer] CorporationDaniel J. Tyson AliasFeinstein, MD, FACP Pgr: [phone] [facility]

## 2013-03-09 NOTE — Assessment & Plan Note (Signed)
Followed closely by Dr Allena KatzPatel. She is having more waxing waning edema. Not clear to me that she is profoundly overloaded at this time. She is planning to see him next month. Will check S Cr today, follow with her to see if she needs to move that appointment up. She is still willing to consider HD

## 2013-03-09 NOTE — Assessment & Plan Note (Signed)
She continues to have dyspnea and purulent cough. She was treated for presumed enterobacter, sputum shows residual MRSA. I favor treating PO with linezolid, will order today for 2 weeks. Shouldn't be affected by her renal insufficiency.   Of Note I discussed with her today the fact that she has several chronic progressive medical problems and that she would do poorly if she were to have an acute decompensation of any of these. She and her daughter agree that she would not want CPR, MV or life support under such circumstances. I will indicate this in the chart and give her a durable DNR form. She is still working with Dr Allena KatzPatel regarding possible HD and still wants to consider this if needed (or able to be tolerated).

## 2013-03-09 NOTE — Telephone Encounter (Signed)
Per RB no alternative and would like pharmacy called and PA or dr. Review done to get this authorized. Calling pharm to see what could

## 2013-03-09 NOTE — Progress Notes (Signed)
ANTICOAGULATION CONSULT NOTE - Initial Consult  Pharmacy Consult for Warfarin Indication: atrial fibrillation  Allergies  Allergen Reactions  . Codeine Other (See Comments)    unknown  . Sulfa Antibiotics     unknown   Patient Measurements: Height: 5\' 2"  (157.5 cm) Weight: 112 lb (50.803 kg) IBW/kg (Calculated) : 50.1  Vital Signs: Temp: 97.9 F (36.6 C) (02/18 1600) Temp src: Oral (02/18 1600) BP: 107/40 mmHg (02/18 1600) Pulse Rate: 57 (02/18 1600)  Labs:  Recent Labs  03/09/13 1102 03/09/13 1755  LABPROT  --  29.8*  INR  --  2.96*  CREATININE 3.2*  --    Medical History: Past Medical History  Diagnosis Date  . HTN (hypertension)     2 YEARS  . Hypothyroidism   . Temporal arteritis   . Pulmonary embolism 1997  . Mitral regurgitation     a. mild by 01/2010 echo.  . Chronic diastolic CHF (congestive heart failure)     a. 01/2010 Echo: EF 60%, mild LVH, High LV filling pressures, No RWMA, mild MR, PASP 37mmHg.  Arman Bogus. Cataracta   . Chronic anemia   . Paroxysmal atrial fibrillation   . Pulmonary HTN     a. PASP 37mmHg by 01/2010 echo.  . S/P cardiac cath     a. 12/2009 R Heart Cath:  RA 12, RV 45/11, PA 47/21, PCWP MEAN 26, CO 4.56, CI 2.7.  . Ischemia     a. 12/2009 Myoview: Ant ischemia, EF 75%----NO CATH DONE DUE TO CKD  . End stage renal disease     a. s/p AVF - not on dialysis.  [name]. History of urinary frequency   . GERD (gastroesophageal reflux disease)   . Sleep trouble   . Constipation, chronic   . Hard of hearing   . Complication of anesthesia   . PONV (postoperative nausea and vomiting)   . Pneumonia 09/15/2012   Medications:  Scheduled:  . [START ON 03/10/2013] amLODipine  5 mg Oral Daily  . [START ON 03/10/2013] calcitRIOL  0.25 mcg Oral Daily  . feeding supplement (ENSURE)  1 Container Oral TID BM  . [START ON 03/10/2013] levothyroxine  75 mcg Oral QAC breakfast  . metoprolol tartrate  100 mg Oral BID  . [START ON 03/10/2013] multivitamin with  minerals  1 tablet Oral Daily  . [START ON 03/10/2013] oxybutynin  10 mg Oral QHS  . [START ON 03/10/2013] pantoprazole  40 mg Oral Daily  . [START ON 03/10/2013] sertraline  25 mg Oral Daily  . vancomycin  1,000 mg Intravenous Once    Assessment: [age] yoF with MRSA PNA, culture obtained 2/6 @ [facility]. Planned treatment with po Linezolid, but patient could not afford ($1500?). Hx of ESRD- has fistula. Chronic Warfarin for PAfib, home dose 3mg  daily exc M,F; last dose unknown  Admit INR 2.96  Goal of Therapy:  INR 2-3   Plan:   Daily PT/INR ordered, monitor CBC, ordered for am  No Warfarin today  Otho BellowsGreen, Emmani Lesueur L PharmD Pager [phone] 03/09/2013, 6:47 PM

## 2013-03-09 NOTE — H&P (Signed)
Name: Cheryl [name] MRN: [id number] DOB: Feb 01, 1925    ADMISSION DATE:  (Not on file)  PRIMARY SERVICE:  PCCM  CHIEF COMPLAINT:  SOB, MRSA PNA  BRIEF PATIENT DESCRIPTION: 80 y/o F direct admitted to [facility] on 2/18 with productive cough, LE Swelling, & sputum positive for MRSA.    SIGNIFICANT EVENTS / STUDIES:  09/14/12 - admit to [facility] for PNA, complete occlusion of RML bronchus.  CT concerning for endobronchial lesion 09/21/12 - FOB: non-diagnostic> single focus abnormal glandular tissue, unable to definitively call malignancy 02/26/12 - seen by [facility] for acute bronchiectasis flare. Sputum culture obtained.  Pt empirically treated with Cipro. .................................................................................................................................................................................. 03/09/12 - seen in office by RB - c/o increased LE swelling, cough with dark sputum production.  Sputum noted to be positive for MRSA.  Hx of enterobacter in past (none this culture). Rx called for zyvox but pt unable to afford (>1500$).  Direct admit to [facility] for further work up.     CULTURES: Sputum 2/6>>>abundant MRSA  ANTIBIOTICS: Vancomycin (MRSA) 2/18>>>  HISTORY OF PRESENT ILLNESS:  80 y/o F with complex medical history to include HTN, CAD, Chronic diastolic CHF, ESRD (not on HD but has fistula, followed by Dr. Allena Katz), Pulmonary HTN, hx of PE, and bronchiectasis with RML syndrome (previous cultures positive for enterobacter) who was seen by Dr. Shelle Iron on 2/6 for an acute bronchiectasis flare.  Sputum cultures were obtained at that time and patient was empirically treated with Cipro.  She returned to see Dr. Delton Coombes on 2/18 with ongoing cough, thick dark sputum production, and increased lower extremity swelling.  Prior sputum cultures reviewed and demonstrated abundant MRSA.  Labs obtained in office and reflected a worsening of renal failure in addition to MRSA positive sputum.   Patient was called for direct admission to address IV antibiotic needs, worsening renal failure and respiratory difficulties.    PAST MEDICAL HISTORY :  Past Medical History  Diagnosis Date  . HTN (hypertension)     2 YEARS  . Hypothyroidism   . Temporal arteritis   . Pulmonary embolism 1997  . Mitral regurgitation     a. mild by 01/2010 echo.  . Chronic diastolic CHF (congestive heart failure)     a. 01/2010 Echo: EF 60%, mild LVH, High LV filling pressures, No RWMA, mild MR, PASP .  Arman Bogus   . Chronic anemia   . Paroxysmal atrial fibrillation   . Pulmonary HTN     a. PASP by 01/2010 echo.  . S/P cardiac cath     a. 12/2009 R Heart Cath:  RA 12, RV 45/11, PA 47/21, PCWP MEAN 26, CO 4.56, CI 2.7.  . Ischemia     a. 12/2009 Myoview: Ant ischemia, EF 75%----NO CATH DONE DUE TO CKD  . End stage renal disease     a. s/p AVF - not on dialysis.  [name] History of urinary frequency   . GERD (gastroesophageal reflux disease)   . Sleep trouble   . Constipation, chronic   . Hard of hearing   . Complication of anesthesia   . PONV (postoperative nausea and vomiting)   . Pneumonia 09/15/2012   Past Surgical History  Procedure Laterality Date  . Temporal arteny bx    . Cataract surgery    . Eye surgery    . Abdominal hysterectomy    . Intramedullary (im) nail intertrochanteric Left 06/30/2012    Procedure: INTRAMEDULLARY (IM) NAIL INTERTROCHANTRIC LEFT;  Surgeon: Harvie Junior, MD;  Location: [facility]  OR;  Service: Orthopedics;  Laterality: Left;  . Injection knee Left 06/30/2012    Procedure: Left Knee Aspiration with Intra-Articular Injection;  Surgeon: Harvie JuniorJohn L Graves, MD;  Location: [facility] OR;  Service: Orthopedics;  Laterality: Left;  . Video bronchoscopy Bilateral 09/21/2012    Procedure: VIDEO BRONCHOSCOPY WITHOUT FLUORO;  Surgeon: Leslye Peerobert S Aldean Pipe, MD;  [facility];  Service: Cardiopulmonary;  Laterality: Bilateral;   Prior to Admission medications   Medication Sig Start  Date End Date Taking? Authorizing Provider  albuterol (PROVENTIL HFA;VENTOLIN HFA) 108 (90 BASE) MCG/ACT inhaler Inhale 2 puffs into the lungs every 6 (six) hours as needed for wheezing or shortness of breath. 02/17/13   Leslye Peerobert S Lexton Hidalgo, MD  amLODipine (NORVASC) 5 MG tablet Take 5 mg by mouth daily.      Historical Provider, MD  calcitRIOL (ROCALTROL) 0.25 MCG capsule Take 0.25 mcg by mouth daily.    Historical Provider, MD  feeding supplement (ENSURE COMPLETE) LIQD Take 237 mLs by mouth daily. 09/25/12   Joseph ArtJessica U Vann, DO  furosemide (LASIX) 40 MG tablet Take 40 mg by mouth daily.    Historical Provider, MD  HYDROcodone-homatropine (HYCODAN) 5-1.5 MG/5ML syrup Take 5 mLs by mouth every 6 (six) hours as needed for cough. 02/25/13   Barbaraann ShareKeith M Clance, MD  levothyroxine (SYNTHROID, LEVOTHROID) 75 MCG tablet Take 75 mcg by mouth daily.      Historical Provider, MD  linezolid (ZYVOX) 600 MG tablet Take 1 tablet (600 mg total) by mouth 2 (two) times daily. 03/09/13   Leslye Peerobert S Jourdan Durbin, MD  Melatonin 5 MG TABS Take 5 mg by mouth at bedtime.     Historical Provider, MD  metoprolol (LOPRESSOR) 100 MG tablet Take 1 tablet (100 mg total) by mouth 2 (two) times daily. 06/28/12   Rollene RotundaJames Hochrein, MD  Multiple Vitamin (MULTIVITAMIN WITH MINERALS) TABS Take 1 tablet by mouth daily. Centrum Silver    Historical Provider, MD  omeprazole (PRILOSEC) 20 MG capsule Take 20 mg by mouth daily.      Historical Provider, MD  oxybutynin (DITROPAN-XL) 10 MG 24 hr tablet Take 10 mg by mouth daily. 10/09/11   Historical Provider, MD  polyethylene glycol (MIRALAX / GLYCOLAX) packet Take 17 g by mouth daily.    Historical Provider, MD  sertraline (ZOLOFT) 25 MG tablet Take 25 mg by mouth daily.    Historical Provider, MD  Spacer/Aero-Holding Chambers (AEROCHAMBER PLUS) inhaler Use as instructed 02/14/13   Leslye Peerobert S Austyn Perriello, MD  warfarin (COUMADIN) 3 MG tablet Take 3 mg by mouth See admin instructions. Take 1 tablet on Sun, Tues, Wed, Thurs, Sat     Historical Provider, MD   Allergies  Allergen Reactions  . Codeine Other (See Comments)    unknown  . Sulfa Antibiotics     unknown    FAMILY HISTORY:  Family History  Problem Relation Age of Onset  . Heart attack Mother 483  . Stroke Father 5279  . Hypertension Sister   . Other      THERE IS NO EARLY HEART DISEASE  . Cancer Brother     prostate  . Diabetes Daughter   . Hyperlipidemia Daughter   . Hypertension Daughter    SOCIAL HISTORY:  reports that she has never smoked. She has never used smokeless tobacco. She reports that she does not drink alcohol or use illicit drugs.  REVIEW OF SYSTEMS:  See HPI for pertinent positives.  All systems reviewed and otherwise negative.   SUBJECTIVE:  VITAL SIGNS: Pulse Rate:  [69] 69 (02/18 1015) BP: (96)/(54) 96/54 mmHg (02/18 1015) SpO2:  [91 %] 91 % (02/18 1015) Weight:  [114 lb 12.8 oz (52.073 kg)] 114 lb 12.8 oz (52.073 kg) (02/18 1015)  PHYSICAL EXAMINATION: Gen: Pleasant, well-nourished, in no distress, kyphotic  ENT: No lesions, mouth clear, oropharynx clear, no postnasal drip  Neck: No JVD, no TMG, no carotid bruits  Lungs: No use of accessory muscles, clear without rales or rhonchi  Cardiovascular: RRR, heart sounds normal, no murmur or gallops, no peripheral edema  Musculoskeletal: No deformities, no cyanosis or clubbing  Neuro: alert, non focal, poor memory, evidence for dementia  Skin: Warm, no lesions or rashes   Recent Labs Lab 03/09/13 1102  NA 119*  K 4.9  CL 80*  CO2 27  BUN 52*  CREATININE 3.2*  GLUCOSE 100*    Recent Labs Lab 03/03/13 1528  HGB 9.5*   No results found.  ASSESSMENT / PLAN:  Bronchiectasis with bronchitis, without acute bronchospasm  MRSA colonization PAH - PASP 37 01/2010 Hx PE  Plan: -direct admit for IV abx -assess CXR on admit -renal dose vancomycin  -place PICC line -DNR / DNI -Pharmacy consult for coumadin mgmt   ESRD (end stage renal disease)  - baseline sr  cr 2.5 AVF - not on HD Hyponatremia - rule out SIADH  Plan: -trend BMP for BUN / Sr Cr -assess urine studies (na, osmolality) -continue calcitriol -dietary supplement -Will need to call Dr. Allena Katz 2/19 for evaluation   Chronic Diastolic CHF PAF HTN  Plan: -continue home norvasc, metoprolol -hold home lasix (40 BID baseline) -consider IV lasix dosing in am 2/19 pending exam / sr cr -coumadin as above  Hypothyroidism   Plan: -continue synthroid  Overactive Bladder  Plan: -continue ditropan  Depression   Plan: -continue zoloft  Canary Brim, NP-C [facility] Pgr: [phone] or [phone]  03/09/2013, 2:17 PM  Levy Pupa, MD, PhD 03/09/2013, 5:29 PM [facility] [phone] or if no answer [phone]

## 2013-03-10 ENCOUNTER — Encounter (HOSPITAL_COMMUNITY): Payer: Medicare Other

## 2013-03-10 ENCOUNTER — Inpatient Hospital Stay (HOSPITAL_COMMUNITY): Payer: Medicare Other

## 2013-03-10 DIAGNOSIS — N289 Disorder of kidney and ureter, unspecified: Secondary | ICD-10-CM

## 2013-03-10 LAB — BASIC METABOLIC PANEL
BUN: 56 mg/dL — AB (ref 6–23)
CHLORIDE: 82 meq/L — AB (ref 96–112)
CO2: 25 mEq/L (ref 19–32)
Calcium: 9.2 mg/dL (ref 8.4–10.5)
Creatinine, Ser: 3.02 mg/dL — ABNORMAL HIGH (ref 0.50–1.10)
GFR calc non Af Amer: 13 mL/min — ABNORMAL LOW (ref >90)
GFR, EST AFRICAN AMERICAN: 15 mL/min — AB (ref >90)
GLUCOSE: 100 mg/dL — AB (ref 70–99)
POTASSIUM: 4.9 meq/L (ref 3.7–5.3)
SODIUM: 122 meq/L — AB (ref 137–147)

## 2013-03-10 LAB — CBC
HCT: 27.8 % — ABNORMAL LOW (ref 36.0–46.0)
HEMATOCRIT: 24.4 % — AB (ref 36.0–46.0)
HEMOGLOBIN: 8.3 g/dL — AB (ref 12.0–15.0)
Hemoglobin: 9.5 g/dL — ABNORMAL LOW (ref 12.0–15.0)
MCH: 33.8 pg (ref 26.0–34.0)
MCH: 33.5 pg (ref 26.0–34.0)
MCHC: 34.2 g/dL (ref 30.0–36.0)
MCHC: 34 g/dL (ref 30.0–36.0)
MCV: 98.9 fL (ref 78.0–100.0)
MCV: 98.4 fL (ref 78.0–100.0)
Platelets: 369 10*3/uL (ref 150–400)
Platelets: 313 10*3/uL (ref 150–400)
RBC: 2.81 MIL/uL — AB (ref 3.87–5.11)
RBC: 2.48 MIL/uL — ABNORMAL LOW (ref 3.87–5.11)
RDW: 18.2 % — ABNORMAL HIGH (ref 11.5–15.5)
RDW: 18.5 % — ABNORMAL HIGH (ref 11.5–15.5)
WBC: 16 10*3/uL — AB (ref 4.0–10.5)
WBC: 13 10*3/uL — AB (ref 4.0–10.5)

## 2013-03-10 LAB — IRON AND TIBC
IRON: 42 ug/dL (ref 42–135)
Saturation Ratios: 40 % (ref 20–55)
TIBC: 106 ug/dL — ABNORMAL LOW (ref 250–470)
UIBC: 64 ug/dL — ABNORMAL LOW (ref 125–400)

## 2013-03-10 LAB — OSMOLALITY: Osmolality: 264 mOsm/kg — ABNORMAL LOW (ref 275–300)

## 2013-03-10 LAB — PROTIME-INR
INR: 3.55 — AB (ref 0.00–1.49)
Prothrombin Time: 34.2 seconds — ABNORMAL HIGH (ref 11.6–15.2)

## 2013-03-10 LAB — VANCOMYCIN, RANDOM: Vancomycin Rm: 13.9 ug/mL

## 2013-03-10 LAB — GLUCOSE, CAPILLARY: Glucose-Capillary: 118 mg/dL — ABNORMAL HIGH (ref 70–99)

## 2013-03-10 MED ORDER — SODIUM CHLORIDE 0.9 % IJ SOLN
10.0000 mL | INTRAMUSCULAR | Status: DC | PRN
  Administered 2013-03-10 – 2013-03-11 (×6): 10 mL
  Administered 2013-03-12: 20 mL
  Administered 2013-03-12: 10 mL
  Administered 2013-03-13: 20 mL
  Administered 2013-03-13: 30 mL
  Administered 2013-03-14: 20 mL
  Administered 2013-03-14: 10 mL
  Administered 2013-03-15: 20 mL
  Administered 2013-03-15 – 2013-03-17 (×2): 10 mL
  Administered 2013-03-18: 20 mL
  Administered 2013-03-19: 10 mL

## 2013-03-10 MED ORDER — ENSURE COMPLETE PO LIQD
237.0000 mL | Freq: Every day | ORAL | Status: DC
  Administered 2013-03-10 – 2013-03-15 (×4): 237 mL via ORAL

## 2013-03-10 MED ORDER — VANCOMYCIN HCL IN DEXTROSE 750-5 MG/150ML-% IV SOLN
750.0000 mg | Freq: Every day | INTRAVENOUS | Status: DC
  Administered 2013-03-10: 750 mg via INTRAVENOUS
  Filled 2013-03-10 (×2): qty 150

## 2013-03-10 MED ORDER — WARFARIN - PHARMACIST DOSING INPATIENT: Freq: Every day | Status: DC

## 2013-03-10 MED ORDER — DARBEPOETIN ALFA-POLYSORBATE 40 MCG/0.4ML IJ SOLN
40.0000 ug | INTRAMUSCULAR | Status: DC
  Administered 2013-03-10: 40 ug via SUBCUTANEOUS
  Filled 2013-03-10 (×3): qty 0.4

## 2013-03-10 MED ORDER — SODIUM CHLORIDE 0.9 % IJ SOLN
10.0000 mL | Freq: Two times a day (BID) | INTRAMUSCULAR | Status: DC
  Administered 2013-03-12 – 2013-03-19 (×4): 10 mL

## 2013-03-10 NOTE — Progress Notes (Signed)
ANTICOAGULATION CONSULT NOTE - Follow Up Consult  Pharmacy Consult for warfarin Indication: atrial fibrillation  Allergies  Allergen Reactions  . Codeine Other (See Comments)    unknown  . Sulfa Antibiotics     unknown    Patient Measurements: Height: 5\' 2"  (157.5 cm) Weight: 117 lb 4.6 oz (53.2 kg) IBW/kg (Calculated) : 50.1 Heparin Dosing Weight:   Vital Signs: Temp: 97.9 F (36.6 C) (02/19 0450) Temp src: Oral (02/19 0450) BP: 101/43 mmHg (02/19 0450) Pulse Rate: 67 (02/19 0450)  Labs:  Recent Labs  03/09/13 1102 03/09/13 1755 03/09/13 2215 03/10/13 0630  HGB  --   --  9.5* 8.3*  HCT  --   --  27.8* 24.4*  PLT  --   --  369 313  LABPROT  --  29.8*  --  34.2*  INR  --  2.96*  --  3.55*  CREATININE 3.2*  --   --  3.02*    Estimated Creatinine Clearance: 10.6 ml/min (by C-G formula based on Cr of 3.02).   Assessment: [age] YOF  [facility], orders for pharmacy to continue warfarin therapy fro h/o afib.  Home dose reported as 3mg  daily  Today's INR = 3.55 (SUPRAtherapeutic)  CBC: Hgb = 8.3 (down from admission value), Platelets WNL  No significant drug interactions noted  Goal of Therapy:  INR 2-3 Monitor platelets by anticoagulation protocol: Yes   Plan:   Hold warfarin therapy tonight for elevated INR  Daily INR  Juliette Alcideustin Dreanna Kyllo, PharmD, BCPS.   Pager: [phone]  03/10/2013,8:31 AM

## 2013-03-10 NOTE — Care Management Note (Addendum)
    Page 1 of 2   03/18/2013     4:39:35 PM   CARE MANAGEMENT NOTE 03/18/2013  Patient:  Aileen FassRRINGTON,Maribelle L   Account Number:  [id number]  Date Initiated:  03/10/2013  Documentation initiated by:  [facility] CenterMAHABIR,Laruen Risser  Subjective/Objective Assessment:   80 Y/O F ADMITTED W/SOB.PNA.     Action/Plan:   FROM HOME W/DTR.HAS PCP,PHARMACY.   Anticipated DC Date:  03/19/2013   Anticipated DC Plan:  HOME W HOSPICE CARE      DC Planning Services  CM consult      Choice offered to / List presented to:  C-4 Adult Children           Status of service:  In process, will continue to follow Medicare Important Message given?   (If response is "NO", the following Medicare IM given date fields will be blank) Date Medicare IM given:   Date Additional Medicare IM given:    Discharge Disposition:    Per UR Regulation:  Reviewed for med. necessity/level of care/duration of stay  If discussed at Long Length of Stay Meetings, dates discussed:   03/17/2013    Comments:  03/18/13 Sheron Tallman RN,BSN NCM [phone] JUST SPOKE TO DTR-GENEVA STATES SHE RE-CONSIDERED & WANTS AMBULANCE TRANSPORTATION.JUST RECEIVED IV ABX ORDER,HH ORDERS,FAXED W/CONFIRMATION TO [facility].  HAVE FAXED W/CONFIRMATION ALL NOTES:03/18/13-PHARMACY-RECOMMENDATION OF IV VANC 750MG  Q48,NEXT DOSE DUE 03/20/13,VANC TROUGH ALSO DUE 03/20/13,DR. SIMONDS TODAY'S NOTES.DR. Park BreedSIMOND INFORMED OF NEED FOR SCRIPT FOR VANC SO HOME HOSPICE PHARMACY CAN PREPARE MED FOR NEXT DOSE.DR. SIMONDS REFERRED TO BRANDY NP TC TO HER TO PLEASE PUT IN IV ABX ORDERS,TROUGH,& HOME HOSPICE ORDER,PALLIATIVE HAS ALREADY DID THE OUT OF FACILITY DNR IN CHART.NOTED PT-HH.WILL FAX THEIR EVAL.FAMILY ABLE TO TRANSPORT HOME.[facility] DME HAVE ALREADY MADE ARRANGEMENTS W/DTR-GENEVA.FAX IV ABX ORDER,HOME HOSPICE ORDER,HHPT ORDER,& D/C ORDER TO:FAX#[phone]/TEL#[phone].  NOTED NEW CHANGE- TO D/C ON HOME IV ABX.FOR PICC TODAY.WILL NEED SPECIFIC IV ABX ORDERS  W/MED/DOSE/FREQ/DURATION,IF LABS NEEDED,PICC LINE FLUSH CARE, HOME HOSPICE ORDER-WILL NEED TO FAX TO [facility] FAX#[phone]/TEL#[phone]-SPOKEN TO [facility]-THEY CAN MANAGE IV ABX, & HOME HOSPICE SERVICES. SOC DATE 03/19/13.DR. Sung AmabileSIMONDS NOTIFIED & AGREE W/SOC TOMORROW.PHARMACY WILL PUT IN RECOMMENDATIONS FOR IV ABX,TROUGHS.HOME HOSPICE PHARMACY WILL NEED SPECIFIC ORDERS SO THEY CAN MIX MED.IV VANC Q48-NEXT DOSE DUE TONIGHT @ 10P.[facility] DME REP MANAGING DME-MADE ARRANGEMENTS W/DTR.MD/NURSE UPDATED.  03/17/13 Imberly Troxler RN,BSN NCM [phone] AWAIT FINAL HOME HOSPICE ORDER.  SPOKE TO [facility] RECEIVED FAXED INFO,WILL ASSESS TOMORROW,[facility] FOR DME-TC [place]-AWARE OF DME & D/C TOMORROW. PMT-COMFORT CARE.REFERRAL FOR HOME HOSPICE CHOICE.TC DTR GENEVA H-[phone]/C#[phone],[facility] [phone]/FAX#[phone].AWAITING CALL BACK FROM ON CALL NURSE, FAXED/W/ CONFIRMATION: FACE SHEET,H&P,MED REC.DME NEEDED:OVERBED TABLE,W/C.FAMILY WILL PROVIDE OWN TRANSP.WILL D/C ON PO MEDS.  03/16/13 Timouthy Gilardi RN,BSN NCM [phone] PMT-GOC TODAY.[facility] FOLLOWING IF HHC NEEDED.AWAIT PMT RECOMMENDATIONS.  03/14/13 Raquelle Pietro RN,BSN NCM [phone] PT-HH.[facility] CHOSEN FOR HHPT.TC KRISTEN REP [facility],AWARE OF REFERRAL.AWAIT FINAL HHPT ORDER.PNA,COUGH,CXR.IV ABS,NEBS.  03/10/13 Deleon Passe RN,BSN NCM [phone] RECOMMEND PT/OT CONS.

## 2013-03-10 NOTE — Progress Notes (Signed)
ANTIBIOTIC CONSULT NOTE - Follow-up  Pharmacy Consult for Vancomycin Indication: MRSA PNA  Allergies  Allergen Reactions  . Codeine Other (See Comments)    unknown  . Sulfa Antibiotics     unknown   Patient Measurements: Height: 5\' 2"  (157.5 cm) Weight: 117 lb 4.6 oz (53.2 kg) IBW/kg (Calculated) : 50.1  Vital Signs: Temp: 97.9 F (36.6 C) (02/19 1423) Temp src: Oral (02/19 1423) BP: 112/52 mmHg (02/19 1423) Pulse Rate: 98 (02/19 1423) Intake/Output from previous day: 02/18 0701 - 02/19 0700 In: 680.8 [P.O.:240; I.V.:440.8] Out: -   Labs:  Recent Labs  03/09/13 1102 03/09/13 2215 03/10/13 0630  WBC  --  16.0* 13.0*  HGB  --  9.5* 8.3*  PLT  --  369 313  CREATININE 3.2*  --  3.02*   Estimated Creatinine Clearance: 10.6 ml/min (by C-G formula based on Cr of 3.02).  Recent Labs  03/10/13 1825  VANCORANDOM 13.9     Assessment: 88 yoF with CKD4, MRSA PNA, culture obtained 2/6 @ [facility]. Planned treatment with po Linezolid, but patient could not afford. [facility]. Pharmacy to dose Vancomycin  Antiinfectives 2/18 >> Vanc >>  Tmax: Afeb WBCs: 13 Renal: SCr 3.2, Cl ~ 5710ml/min (C-G and N)  Microbiology 2/6 sputum: (@ [facility]) MRSA, sens to Clinda,Gent,TCN,Zyvox, Vanc   Drug level / dose changes info: 2/19: 18:00 random vanco level = 13.9 mcg/ml following vanco 1gm x 1 2/18 at 21:12  Goal of Therapy:  Vancomycin trough level 15-20 mcg/ml  Plan:  - vancomycin 750mg  IV q24h - vancomycin trough at steady state - follow-up clinical course, culture results, renal function - follow-up antibiotic de-escalation and length of therapy  Thank you for the consult.  Tomi BambergerJesse Trever Streater, PharmD, BCPS Pager: [phone] Pharmacy: [phone] 03/10/2013 7:49 PM

## 2013-03-10 NOTE — Progress Notes (Signed)
Name: Cheryl [name] MRN: [id number] DOB: [birth year]    ADMISSION DATE:  03/09/2013  PRIMARY SERVICE:  PCCM  CHIEF COMPLAINT:  SOB, MRSA PNA  BRIEF PATIENT DESCRIPTION: 80 y/o F direct admitted to [facility] on 2/18 with productive cough, LE Swelling, & sputum positive for MRSA.    SIGNIFICANT EVENTS / STUDIES:  09/14/12 - admit to [facility] for PNA, complete occlusion of RML bronchus.  CT concerning for endobronchial lesion 09/21/12 - FOB: non-diagnostic> single focus abnormal glandular tissue, unable to definitively call malignancy 02/26/12 - seen by [facility] for acute bronchiectasis flare. Sputum culture obtained.  Pt empirically treated with Cipro. .................................................................................................................................................................................. 03/09/12 - seen in office by RB - c/o increased LE swelling, cough with dark sputum production.  Sputum noted to be positive for MRSA.  Hx of enterobacter in past (none this culture). Rx called for zyvox but pt unable to afford (>1500$).  Direct admit to [facility] for further work up.     CULTURES: Sputum 2/6>>>abundant MRSA  ANTIBIOTICS: Vancomycin (MRSA) 2/18>>>   SUBJECTIVE: No acute events overnight  VITAL SIGNS: Temp:  [97.9 F (36.6 C)] 97.9 F (36.6 C) (02/19 0450) Pulse Rate:  [57-108] 92 (02/19 0904) Resp:  [16] 16 (02/19 0450) BP: (100-119)/(36-53) 101/43 mmHg (02/19 0450) SpO2:  [92 %-96 %] 92 % (02/19 0904) Weight:  [112 lb (50.803 kg)-117 lb 4.6 oz (53.2 kg)] 117 lb 4.6 oz (53.2 kg) (02/19 0450)  PHYSICAL EXAMINATION: Gen: Pleasant, well-nourished, in no distress, kyphotic  ENT: No lesions, mouth clear, oropharynx clear, no postnasal drip  Neck: No JVD, no TMG, no carotid bruits  Lungs: No use of accessory muscles, clear without rales or rhonchi, hoarse voice Cardiovascular: RRR, heart sounds normal, no murmur or gallops, no peripheral edema  Musculoskeletal:  No deformities, no cyanosis or clubbing  Neuro: alert, non focal, poor memory, evidence for dementia  Skin: Warm, no lesions or rashes   Recent Labs Lab 03/09/13 1102 03/10/13 0630  NA 119* 122*  K 4.9 4.9  CL 80* 82*  CO2 27 25  BUN 52* 56*  CREATININE 3.2* 3.02*  GLUCOSE 100* 100*    Recent Labs Lab 03/03/13 1528 03/09/13 2215 03/10/13 0630  HGB 9.5* 9.5* 8.3*  HCT  --  27.8* 24.4*  WBC  --  16.0* 13.0*  PLT  --  369 313   Dg Chest Port 1 View  03/10/2013   CLINICAL DATA:  Shortness of breath. Pneumonia. Congestive heart failure.  EXAM: PORTABLE CHEST - 1 VIEW  COMPARISON:  03/09/2013  FINDINGS: Moderate right pleural effusion and right lung airspace disease shows no significant change. Mild cardiomegaly stable. Left jugular central venous catheter remains in appropriate position.  IMPRESSION: No significant change compared with prior exam.   Electronically Signed   By: Myles Rosenthal M.D.   On: 03/10/2013 07:10   Dg Chest Port 1 View  03/09/2013   CLINICAL DATA:  Central line placement  EXAM: PORTABLE CHEST - 1 VIEW  COMPARISON:  Earlier film of the same day  FINDINGS: Left IJ central line extends to the mid SVC. No pneumothorax. Right pleural effusion with right lower lung consolidation/ atelectasis. Patchy airspace disease in the right mid and upper lung slightly increased. Left lung remains clear. Heart size upper limits normal. Atheromatous aorta. .  Prominent costochondral calcifications.  IMPRESSION: 1. Central line to mid SVC without pneumothorax. 2. Slight worsening of right lung airspace disease, with persistent effusion.   Electronically Signed   By: Oley Balm  M.D.   On: 03/09/2013 19:58   Dg Chest Port 1 View  03/09/2013   CLINICAL DATA:  Cough  EXAM: PORTABLE CHEST - 1 VIEW  COMPARISON:  02/14/2013  FINDINGS: New right pleural effusion with progressive consolidation/ atelectasis in the right lower lung. Patchy poorly marginated airspace opacities in the right  upper lung and right middle lobe have also progressed. . Left lung remains clear. Heart size upper limits normal for technique. Regional bones unremarkable.  IMPRESSION: 1. Worsening right lung airspace disease most marked at the lung base with progressive pleural effusion.   Electronically Signed   By: Oley Balmaniel  Hassell M.D.   On: 03/09/2013 16:36    ASSESSMENT / PLAN:  Bronchiectasis with bronchitis, without acute bronchospasm  MRSA Colonization PAH - PASP 37 01/2010 Hx PE  Plan: -renal dose vancomycin, will need to decide how to get her home with abx (has TLC) -DNR / DNI -Pharmacy consult for coumadin mgmt   ESRD (end stage renal disease)  - baseline sr cr 2.5 AVF - not on HD Hyponatremia - rule out SIADH  Plan: -trend BMP for BUN / Sr Cr -assess urine studies (na, osmolality) >>urine na 18 - ? If this is renal salt wasting in setting of diuretics -continue calcitriol -dietary supplement -Dr. Allena KatzPatel notified for evaluation, appreciate input  Chronic Diastolic CHF PAF HTN  Plan: -continue home norvasc, metoprolol -hold home lasix (40 BID baseline) -coumadin as above  Hypothyroidism   Plan: -continue synthroid  Overactive Bladder  Plan: -continue ditropan  Depression   Plan: -continue zoloft   Coagulopathy -chronic coumadin administration  Plan: -coumadin per pharmacy   Canary BrimBrandi Ollis, NP-C [place] [facility] Pgr: [phone] or [phone]  Independently examined pt, evaluated data & formulated above care plan with NP who scribed this note & edited by me.  Holden Draughon V.   03/10/2013, 12:39 PM

## 2013-03-10 NOTE — Consult Note (Signed)
Reason for Consult: Acute renal failure on chronic kidney disease stage IV Referring Physician: Baltazar Apo MD (pulmonary/critical care).    HPI:  81 year old Caucasian woman well known to me from follow up for chronic kidney disease stage IV (baseline creatinine ranging from anywhere between 2.2-2.7) with a left upper arm brachiocephalic fistula in anticipation for dialysis in the future. She also has underlying hypertension, coronary artery disease, diastolic heart failure, pulmonary hypertension with a history of PE and bronchiectasis with recurrent propensity for flares.  She was admitted to the hospital yesterday with worsening renal failure, acute bronchiectasis flare not responsive to outpatient antibiotic therapy and MRSA positive sputum. On admission, was noted to have an elevated creatinine of 3.2 that has improved to 3.0 today with some intravenous fluids and treatment of her pneumonia. Urine and serum osmolality pending-workup for hyponatremia (sodium 119).  She denies any obvious gastrointestinal losses and reports that she is compliant with her furosemide-which surprised her because of her worsening pedal edema. Denies any recent nonsteroidal anti-inflammatory drug use/contrast exposure. Denies any dysuria, urgency, flank pain or hematuria.    Past Medical History  Diagnosis Date  . HTN (hypertension)     2 YEARS  . Hypothyroidism   . Temporal arteritis   . Pulmonary embolism 1997  . Mitral regurgitation     a. mild by 01/2010 echo.  . Chronic diastolic CHF (congestive heart failure)     a. 01/2010 Echo: EF 60%, mild LVH, High LV filling pressures, No RWMA, mild MR, PASP 13mHg.  .Edwin Cap  . Chronic anemia   . Paroxysmal atrial fibrillation   . Pulmonary HTN     a. PASP 360mg by 01/2010 echo.  . S/P cardiac cath     a. 12/2009 R Heart Cath:  RA 12, RV 45/11, PA 47/21, PCWP MEAN 26, CO 4.56, CI 2.7.  . Ischemia     a. 12/2009 Myoview: Ant ischemia, EF 75%----NO CATH  DONE DUE TO CKD  . End stage renal disease     a. s/p AVF - not on dialysis.  . Marland Kitchenistory of urinary frequency   . GERD (gastroesophageal reflux disease)   . Sleep trouble   . Constipation, chronic   . Hard of hearing   . Complication of anesthesia   . PONV (postoperative nausea and vomiting)   . Pneumonia 09/15/2012    Past Surgical History  Procedure Laterality Date  . Temporal arteny bx    . Cataract surgery    . Eye surgery    . Abdominal hysterectomy    . Intramedullary (im) nail intertrochanteric Left 06/30/2012    Procedure: INTRAMEDULLARY (IM) NAIL INTERTROCHANTRIC LEFT;  Surgeon: JoAlta CorningMD;  [facility];  Laterality: Left;  . Injection knee Left 06/30/2012    Procedure: Left Knee Aspiration with Intra-Articular Injection;  Surgeon: JoAlta CorningMD;  [facility];  Laterality: Left;  . Video bronchoscopy Bilateral 09/21/2012    Procedure: VIDEO BRONCHOSCOPY WITHOUT FLUORO;  Surgeon: RoCollene GobbleMD;  [facility];  Laterality: Bilateral;    Family History  Problem Relation Age of Onset  . Heart attack Mother [id number]. Stroke Father 7948. Hypertension Sister   . Other      THERE IS NO EARLY HEART DISEASE  . Cancer Brother     prostate  . Diabetes Daughter   . Hyperlipidemia Daughter   . Hypertension Daughter  Social History:  reports that she has never smoked. She has never used smokeless tobacco. She reports that she does not drink alcohol or use illicit drugs.  Allergies:  Allergies  Allergen Reactions  . Codeine Other (See Comments)    unknown  . Sulfa Antibiotics     unknown    Medications:  Prior to Admission:  Prescriptions prior to admission  Medication Sig Dispense Refill  . albuterol (PROVENTIL HFA;VENTOLIN HFA) 108 (90 BASE) MCG/ACT inhaler Inhale 2 puffs into the lungs every 6 (six) hours as needed for wheezing or shortness of breath.  1 Inhaler  6   . amLODipine (NORVASC) 5 MG tablet Take 5 mg by mouth daily.        . calcitRIOL (ROCALTROL) 0.25 MCG capsule Take 0.25 mcg by mouth daily.      [name] epoetin alfa (EPOGEN,PROCRIT) 25638 UNIT/ML injection Inject 10,000 Units into the vein once a week.      . feeding supplement (ENSURE COMPLETE) LIQD Take 237 mLs by mouth daily.      . furosemide (LASIX) 40 MG tablet Take 40 mg by mouth 2 (two) times daily.       [name] HYDROcodone-homatropine (HYCODAN) 5-1.5 MG/5ML syrup Take 5 mLs by mouth every 6 (six) hours as needed for cough.  180 mL  0  . levothyroxine (SYNTHROID, LEVOTHROID) 75 MCG tablet Take 75 mcg by mouth daily.       [name] linezolid (ZYVOX) 600 MG tablet Take 1 tablet (600 mg total) by mouth 2 (two) times daily.  28 tablet  0  . Melatonin 5 MG TABS Take 5 mg by mouth at bedtime.       . metoprolol (LOPRESSOR) 100 MG tablet Take 1 tablet (100 mg total) by mouth 2 (two) times daily.  60 tablet  11  . Multiple Vitamin (MULTIVITAMIN WITH MINERALS) TABS Take 1 tablet by mouth daily. Centrum Silver      . omeprazole (PRILOSEC) 20 MG capsule Take 20 mg by mouth daily.        [name] oxybutynin (DITROPAN-XL) 10 MG 24 hr tablet Take 10 mg by mouth daily.      . polyethylene glycol (MIRALAX / GLYCOLAX) packet Take 17 g by mouth daily.      . sertraline (ZOLOFT) 25 MG tablet Take 25 mg by mouth daily.      [name] Spacer/Aero-Holding Chambers (AEROCHAMBER PLUS) inhaler Use as instructed  1 each  0  . warfarin (COUMADIN) 3 MG tablet Take 3 mg by mouth every evening.        Scheduled: . amLODipine  5 mg Oral Daily  . calcitRIOL  0.25 mcg Oral Daily  . darbepoetin (ARANESP) injection - NON-DIALYSIS  40 mcg Subcutaneous Q Thu-1800  . feeding supplement (ENSURE)  1 Container Oral TID BM  . levothyroxine  75 mcg Oral QAC breakfast  . metoprolol tartrate  100 mg Oral BID  . multivitamin with minerals  1 tablet Oral Daily  . oxybutynin  10 mg Oral QHS  . pantoprazole  40 mg Oral Daily  . sertraline  25 mg Oral Daily   . Warfarin - Pharmacist Dosing Inpatient   Does not apply q1800    Results for orders placed during the hospital encounter of 03/09/13 (from the past 48 hour(s))  PROTIME-INR     Status: Abnormal   Collection Time    03/09/13  5:55 PM      Result Value Ref Range   Prothrombin Time 29.8 (*) 11.6 -  15.2 seconds   INR 2.96 (*) 0.00 - 1.49  CBC     Status: Abnormal   Collection Time    03/09/13 10:15 PM      Result Value Ref Range   WBC 16.0 (*) 4.0 - 10.5 K/uL   RBC 2.81 (*) 3.87 - 5.11 MIL/uL   Hemoglobin 9.5 (*) 12.0 - 15.0 g/dL   HCT 27.8 (*) 36.0 - 46.0 %   MCV 98.9  78.0 - 100.0 fL   MCH 33.8  26.0 - 34.0 pg   MCHC 34.2  30.0 - 36.0 g/dL   RDW 18.2 (*) 11.5 - 15.5 %   Platelets 369  150 - 400 K/uL  CBC     Status: Abnormal   Collection Time    03/10/13  6:30 AM      Result Value Ref Range   WBC 13.0 (*) 4.0 - 10.5 K/uL   RBC 2.48 (*) 3.87 - 5.11 MIL/uL   Hemoglobin 8.3 (*) 12.0 - 15.0 g/dL   HCT 24.4 (*) 36.0 - 46.0 %   MCV 98.4  78.0 - 100.0 fL   MCH 33.5  26.0 - 34.0 pg   MCHC 34.0  30.0 - 36.0 g/dL   RDW 18.5 (*) 11.5 - 15.5 %   Platelets 313  150 - 400 K/uL  BASIC METABOLIC PANEL     Status: Abnormal   Collection Time    03/10/13  6:30 AM      Result Value Ref Range   Sodium 122 (*) 137 - 147 mEq/L   Potassium 4.9  3.7 - 5.3 mEq/L   Comment: MODERATE HEMOLYSIS     HEMOLYSIS AT THIS LEVEL MAY AFFECT RESULT   Chloride 82 (*) 96 - 112 mEq/L   CO2 25  19 - 32 mEq/L   Glucose, Bld 100 (*) 70 - 99 mg/dL   BUN 56 (*) 6 - 23 mg/dL   Creatinine, Ser 3.02 (*) 0.50 - 1.10 mg/dL   Calcium 9.2  8.4 - 10.5 mg/dL   GFR calc non Af Amer 13 (*) >90 mL/min   GFR calc Af Amer 15 (*) >90 mL/min   Comment: (NOTE)     The eGFR has been calculated using the CKD EPI equation.     This calculation has not been validated in all clinical situations.     eGFR's persistently <90 mL/min signify possible Chronic Kidney     Disease.  PROTIME-INR     Status: Abnormal   Collection  Time    03/10/13  6:30 AM      Result Value Ref Range   Prothrombin Time 34.2 (*) 11.6 - 15.2 seconds   INR 3.55 (*) 0.00 - 1.49  GLUCOSE, CAPILLARY     Status: Abnormal   Collection Time    03/10/13 11:54 AM      Result Value Ref Range   Glucose-Capillary 118 (*) 70 - 99 mg/dL    Dg Chest Port 1 View  03/10/2013   CLINICAL DATA:  Shortness of breath. Pneumonia. Congestive heart failure.  EXAM: PORTABLE CHEST - 1 VIEW  COMPARISON:  03/09/2013  FINDINGS: Moderate right pleural effusion and right lung airspace disease shows no significant change. Mild cardiomegaly stable. Left jugular central venous catheter remains in appropriate position.  IMPRESSION: No significant change compared with prior exam.   Electronically Signed   By: Earle Gell M.D.   On: 03/10/2013 07:10   Dg Chest Port 1 View  03/09/2013   CLINICAL DATA:  Central line placement  EXAM: PORTABLE CHEST - 1 VIEW  COMPARISON:  Earlier film of the same day  FINDINGS: Left IJ central line extends to the mid SVC. No pneumothorax. Right pleural effusion with right lower lung consolidation/ atelectasis. Patchy airspace disease in the right mid and upper lung slightly increased. Left lung remains clear. Heart size upper limits normal. Atheromatous aorta. .  Prominent costochondral calcifications.  IMPRESSION: 1. Central line to mid SVC without pneumothorax. 2. Slight worsening of right lung airspace disease, with persistent effusion.   Electronically Signed   By: Arne Cleveland M.D.   On: 03/09/2013 19:58   Dg Chest Port 1 View  03/09/2013   CLINICAL DATA:  Cough  EXAM: PORTABLE CHEST - 1 VIEW  COMPARISON:  02/14/2013  FINDINGS: New right pleural effusion with progressive consolidation/ atelectasis in the right lower lung. Patchy poorly marginated airspace opacities in the right upper lung and right middle lobe have also progressed. . Left lung remains clear. Heart size upper limits normal for technique. Regional bones unremarkable.   IMPRESSION: 1. Worsening right lung airspace disease most marked at the lung base with progressive pleural effusion.   Electronically Signed   By: Arne Cleveland M.D.   On: 03/09/2013 16:36    Review of Systems  Constitutional: Positive for chills and malaise/fatigue. Negative for fever, weight loss and diaphoresis.  HENT: Positive for sore throat. Negative for congestion, ear discharge and nosebleeds.        Hoarse voice  Eyes: Negative.   Respiratory: Positive for cough, sputum production, shortness of breath and wheezing. Negative for hemoptysis and stridor.   Cardiovascular: Positive for chest pain and leg swelling. Negative for palpitations, orthopnea, claudication and PND.       Pleuritic CP  Gastrointestinal: Positive for nausea. Negative for heartburn, vomiting, abdominal pain and constipation.       Poor appetite  Genitourinary: Negative.   Musculoskeletal: Negative.   Skin: Negative.   Neurological: Positive for weakness. Negative for dizziness, tingling, tremors, focal weakness and headaches.  Psychiatric/Behavioral: Negative for hallucinations and substance abuse. The patient is nervous/anxious.    Blood pressure 101/43, pulse 92, temperature 97.9 F (36.6 C), temperature source Oral, resp. rate 16, height _0  (1.575 m), weight 53.2 kg (117 lb 4.6 oz), SpO2 92.00%. Physical Exam  Nursing note and vitals reviewed. Constitutional: She appears well-developed. No distress.  Appears uncomfortable  HENT:  Head: Normocephalic and atraumatic.  Nose: Nose normal.  Mouth/Throat: No oropharyngeal exudate.  Dry oropharynx  Eyes: Conjunctivae and EOM are normal. Pupils are equal, round, and reactive to light. No scleral icterus.  Neck: Normal range of motion. Neck supple. No JVD present. No tracheal deviation present. Thyromegaly present.  Cardiovascular: Normal rate and regular rhythm.  Exam reveals no friction rub.   Murmur heard. 3/6 ESM  Respiratory: Effort normal. She has  wheezes. She has rales. She exhibits no tenderness.  GI: Soft. Bowel sounds are normal. She exhibits no distension. There is no tenderness. There is no rebound.  Musculoskeletal: Normal range of motion. She exhibits edema.  2+ RLE edema 1+ LLE edema  Neurological: She is alert. No cranial nerve deficit. Coordination normal.  Poorly oriented  Skin: Skin is warm and dry. No rash noted. She is not diaphoretic. No erythema.    Assessment/Plan: 1. Acute renal failure chronic kidney disease stage IV: Given the history, available data base and physical exam findings-it appears that she might in fact be suffering from intravascular volume contraction in  the face of interstitial edema (pedal edema). I agree with temporarily holding her furosemide and giving her gentle intravenous normal saline at 75 cc an hour. If this in fact is the etiology, this should also improve her hyponatremia which may be partly from volume depletion with hypotonic fluid intake. I will order a urinalysis in order to evaluate urinary sediment (although there will be little or no utility of further serological screening with her depressed GFR). No indication at this time for renal ultrasound. Monitor input/output closely and avoid NSAIDs/IV contrast as currently being done. 2. Hyponatremia: This appears to be bi-factorial from intravascular volume contraction and hypotonic fluid repletion as well as inappropriate ADH stimulation by respiratory illness-bronchiectasis. Agree with fluid restriction by mouth 1200 cc per day and we'll give him intravenous normal saline while holding her furosemide. Continue daily labs to monitor sodium trend. No indication for hypertonic saline. 3. Acute bronchiectasis flare/MRSA sputum positive: On antibiotic therapy with vancomycin and receiving bronchodilator therapy/albuterol nebulizer. 4. Anemia: Primary anemia of chronic kidney disease and exacerbated by her acute illness/inflammatory complex. Check  iron stores and resume Aranesp. 5. Metabolic bone disease: Continue calcitriol for PTH suppression, monitor calcium/phosphorus trend.  Ahuva Poynor K. 03/10/2013, 1:14 PM

## 2013-03-10 NOTE — Progress Notes (Signed)
INITIAL NUTRITION ASSESSMENT  Pt meets criteria for severe MALNUTRITION in the context of chronic illness as evidenced by <50% estimated energy intake with 14% weight loss in the past 7 months.  DOCUMENTATION CODES Per approved criteria  -Severe malnutrition in the context of chronic illness   INTERVENTION: - Recommend MD discuss enteral nutrition with pt/family as pt with minimal intake x 2 months despite encouragement from family, being on nutritional supplements, and being on an appetite stimulant per daughter's report - Ensure Complete daily - Will continue to monitor   NUTRITION DIAGNOSIS: Inadequate oral intake related to very poor appetite as evidenced by daughter's report.   Goal: Pt to consume >90% of meals/supplements  Monitor:  Weights, labs, intake  Reason for Assessment: Malnutrition screening tool   80 y.o. female  Admitting Dx: Shortness of breath  ASSESSMENT: Pt with complex medical history to include HTN, CAD, Chronic diastolic CHF, ESRD (not on HD but has fistula), Pulmonary HTN, hx of PE, and bronchiectasis with RML syndrome who was seen by Dr. Gwenette Greet on 2/6 for an acute bronchiectasis flare. Sputum cultures were obtained at that time and patient was empirically treated with Cipro. She returned to see Dr. Lamonte Sakai on 2/18 with ongoing cough, thick dark sputum production, and increased lower extremity swelling. Prior sputum cultures reviewed and demonstrated abundant MRSA. Labs obtained in office and reflected a worsening of renal failure in addition to MRSA positive sputum. Patient was called for direct admission to address IV antibiotic needs, worsening renal failure and respiratory difficulties.   Met with pt and daughter who report pt with very poor appetite and intake for the past 2 months with pt consuming only bites of food at mealtimes but drinking 1 Ensure throughout the course of the day. Daughter reports pt has lost 50 pounds unintentionally since last summer  however weight trend shows pt's weight down 19 pounds since then. Daughter reports pt has been on an appetite stimulant in the past couple months but it didn't help. Pt falling asleep during visit. Nephrology following for pt's acute renal failure and chronic kidney disease stage IV.   Sodium low Potassium WNL Phosphorus not ordered BUN/Cr elevated with low GFR   Height: Ht Readings from Last 1 Encounters:  03/09/13 5' 2"  (1.575 m)    Weight: Wt Readings from Last 1 Encounters:  03/10/13 117 lb 4.6 oz (53.2 kg)    Ideal Body Weight: 110 lb   % Ideal Body Weight: 106%  Wt Readings from Last 15 Encounters:  03/10/13 117 lb 4.6 oz (53.2 kg)  03/09/13 114 lb 12.8 oz (52.073 kg)  02/25/13 105 lb 3.2 oz (47.718 kg)  02/25/13 111 lb (50.349 kg)  02/24/13 105 lb (47.628 kg)  02/14/13 113 lb (51.256 kg)  12/30/12 113 lb (51.256 kg)  11/30/12 110 lb (49.896 kg)  09/25/12 115 lb 5.1 oz (52.309 kg)  09/25/12 115 lb 5.1 oz (52.309 kg)  08/06/12 136 lb 9.6 oz (61.961 kg)  07/28/12 136 lb 9.6 oz (61.961 kg)  07/22/12 137 lb 9.6 oz (62.415 kg)  07/16/12 133 lb 3.2 oz (60.419 kg)  07/15/12 133 lb 3.2 oz (60.419 kg)    Usual Body Weight: 167 lb per daughter   % Usual Body Weight: 70%  BMI:  Body mass index is 21.45 kg/(m^2).  Estimated Nutritional Needs: Kcal: 1350-1550 Protein: 55-65g Fluid: 1.3-1.5L/day  Skin: +1 RLE, LLE edema, stage 1 sacral pressure ulcer   Diet Order: Renal  EDUCATION NEEDS: -Education needs addressed - briefly  discussed renal diet   Intake/Output Summary (Last 24 hours) at 03/10/13 1654 Last data filed at 03/10/13 1424  Gross per 24 hour  Intake 1040.83 ml  Output      0 ml  Net 1040.83 ml    Last BM: 2/19  Labs:   Recent Labs Lab 03/09/13 1102 03/10/13 0630  NA 119* 122*  K 4.9 4.9  CL 80* 82*  CO2 27 25  BUN 52* 56*  CREATININE 3.2* 3.02*  CALCIUM 9.5 9.2  GLUCOSE 100* 100*    CBG (last 3)   Recent Labs  03/10/13 1154   GLUCAP 118*    Scheduled Meds: . amLODipine  5 mg Oral Daily  . calcitRIOL  0.25 mcg Oral Daily  . darbepoetin (ARANESP) injection - NON-DIALYSIS  40 mcg Subcutaneous Q Thu-1800  . feeding supplement (ENSURE)  1 Container Oral TID BM  . levothyroxine  75 mcg Oral QAC breakfast  . metoprolol tartrate  100 mg Oral BID  . multivitamin with minerals  1 tablet Oral Daily  . oxybutynin  10 mg Oral QHS  . pantoprazole  40 mg Oral Daily  . sertraline  25 mg Oral Daily  . Warfarin - Pharmacist Dosing Inpatient   Does not apply q1800    Continuous Infusions: . sodium chloride 1,000 mL (03/10/13 1312)    Past Medical History  Diagnosis Date  . HTN (hypertension)     2 YEARS  . Hypothyroidism   . Temporal arteritis   . Pulmonary embolism 1997  . Mitral regurgitation     a. mild by 01/2010 echo.  . Chronic diastolic CHF (congestive heart failure)     a. 01/2010 Echo: EF 60%, mild LVH, High LV filling pressures, No RWMA, mild MR, PASP 69mHg.  .Edwin Cap  . Chronic anemia   . Paroxysmal atrial fibrillation   . Pulmonary HTN     a. PASP 311mg by 01/2010 echo.  . S/P cardiac cath     a. 12/2009 R Heart Cath:  RA 12, RV 45/11, PA 47/21, PCWP MEAN 26, CO 4.56, CI 2.7.  . Ischemia     a. 12/2009 Myoview: Ant ischemia, EF 75%----NO CATH DONE DUE TO CKD  . End stage renal disease     a. s/p AVF - not on dialysis.  . Marland Kitchenistory of urinary frequency   . GERD (gastroesophageal reflux disease)   . Sleep trouble   . Constipation, chronic   . Hard of hearing   . Complication of anesthesia   . PONV (postoperative nausea and vomiting)   . Pneumonia 09/15/2012    Past Surgical History  Procedure Laterality Date  . Temporal arteny bx    . Cataract surgery    . Eye surgery    . Abdominal hysterectomy    . Intramedullary (im) nail intertrochanteric Left 06/30/2012    Procedure: INTRAMEDULLARY (IM) NAIL INTERTROCHANTRIC LEFT;  Surgeon: JoAlta CorningMD;  [facility] [facility]:  Orthopedics;  Laterality: Left;  . Injection knee Left 06/30/2012    Procedure: Left Knee Aspiration with Intra-Articular Injection;  Surgeon: JoAlta CorningMD;  [place] [facility];  Laterality: Left;  . Video bronchoscopy Bilateral 09/21/2012    Procedure: VIDEO BRONCHOSCOPY WITHOUT FLUORO;  Surgeon: RoCollene GobbleMD;  [place] [facility]: Cardiop[facility];  Laterality: Bilateral;    HeMikey CollegeS, RDNorth HillsLDDe Sotoager [phone] Hours Pager

## 2013-03-11 ENCOUNTER — Inpatient Hospital Stay (HOSPITAL_COMMUNITY): Payer: Medicare Other

## 2013-03-11 DIAGNOSIS — J15212 Pneumonia due to Methicillin resistant Staphylococcus aureus: Principal | ICD-10-CM

## 2013-03-11 DIAGNOSIS — I509 Heart failure, unspecified: Secondary | ICD-10-CM

## 2013-03-11 DIAGNOSIS — I5032 Chronic diastolic (congestive) heart failure: Secondary | ICD-10-CM

## 2013-03-11 DIAGNOSIS — E43 Unspecified severe protein-calorie malnutrition: Secondary | ICD-10-CM | POA: Insufficient documentation

## 2013-03-11 LAB — RENAL FUNCTION PANEL
Albumin: 1.9 g/dL — ABNORMAL LOW (ref 3.5–5.2)
BUN: 48 mg/dL — ABNORMAL HIGH (ref 6–23)
CO2: 25 meq/L (ref 19–32)
Calcium: 9.1 mg/dL (ref 8.4–10.5)
Chloride: 83 mEq/L — ABNORMAL LOW (ref 96–112)
Creatinine, Ser: 2.67 mg/dL — ABNORMAL HIGH (ref 0.50–1.10)
GFR, EST AFRICAN AMERICAN: 18 mL/min — AB (ref >90)
GFR, EST NON AFRICAN AMERICAN: 15 mL/min — AB (ref >90)
GLUCOSE: 101 mg/dL — AB (ref 70–99)
POTASSIUM: 3.8 meq/L (ref 3.7–5.3)
Phosphorus: 4.9 mg/dL — ABNORMAL HIGH (ref 2.3–4.6)
SODIUM: 122 meq/L — AB (ref 137–147)

## 2013-03-11 LAB — OSMOLALITY, URINE: Osmolality, Ur: 298 mOsm/kg — ABNORMAL LOW (ref 390–1090)

## 2013-03-11 LAB — PROTIME-INR
INR: 2.91 — ABNORMAL HIGH (ref 0.00–1.49)
Prothrombin Time: 29.4 seconds — ABNORMAL HIGH (ref 11.6–15.2)

## 2013-03-11 LAB — SODIUM, URINE, RANDOM: Sodium, Ur: 29 mEq/L

## 2013-03-11 LAB — TSH: TSH: 1.37 u[IU]/mL (ref 0.350–4.500)

## 2013-03-11 LAB — MAGNESIUM: Magnesium: 1.9 mg/dL (ref 1.5–2.5)

## 2013-03-11 LAB — FERRITIN: Ferritin: 3092 ng/mL — ABNORMAL HIGH (ref 10–291)

## 2013-03-11 MED ORDER — ALBUTEROL SULFATE (2.5 MG/3ML) 0.083% IN NEBU
2.5000 mg | INHALATION_SOLUTION | RESPIRATORY_TRACT | Status: DC | PRN
  Administered 2013-03-14: 2.5 mg via RESPIRATORY_TRACT
  Filled 2013-03-11: qty 3

## 2013-03-11 MED ORDER — NYSTATIN 100000 UNIT/ML MT SUSP
5.0000 mL | Freq: Three times a day (TID) | OROMUCOSAL | Status: DC
  Administered 2013-03-11 – 2013-03-19 (×24): 500000 [IU] via ORAL
  Filled 2013-03-11 (×26): qty 5

## 2013-03-11 MED ORDER — VANCOMYCIN HCL IN DEXTROSE 750-5 MG/150ML-% IV SOLN
750.0000 mg | INTRAVENOUS | Status: DC
  Administered 2013-03-12 – 2013-03-18 (×4): 750 mg via INTRAVENOUS
  Filled 2013-03-11 (×4): qty 150

## 2013-03-11 MED ORDER — FUROSEMIDE 10 MG/ML IJ SOLN
80.0000 mg | Freq: Two times a day (BID) | INTRAMUSCULAR | Status: DC
  Administered 2013-03-11: 80 mg via INTRAVENOUS
  Filled 2013-03-11 (×3): qty 8

## 2013-03-11 MED ORDER — WARFARIN SODIUM 1 MG PO TABS
1.5000 mg | ORAL_TABLET | Freq: Once | ORAL | Status: AC
  Administered 2013-03-11: 1.5 mg via ORAL
  Filled 2013-03-11: qty 1

## 2013-03-11 MED ORDER — ALBUTEROL SULFATE (2.5 MG/3ML) 0.083% IN NEBU
2.5000 mg | INHALATION_SOLUTION | RESPIRATORY_TRACT | Status: DC
  Administered 2013-03-11 – 2013-03-13 (×14): 2.5 mg via RESPIRATORY_TRACT
  Filled 2013-03-11 (×14): qty 3

## 2013-03-11 MED ORDER — FUROSEMIDE 10 MG/ML IJ SOLN
60.0000 mg | Freq: Two times a day (BID) | INTRAMUSCULAR | Status: DC
  Administered 2013-03-11 – 2013-03-13 (×4): 60 mg via INTRAVENOUS
  Filled 2013-03-11 (×5): qty 6

## 2013-03-11 NOTE — Progress Notes (Signed)
eLink Physician-Brief Progress Note Patient Name: Cheryl FassMargel L [name] DOB: 04/18/1925 MRN: [id number]  Date of Service  03/11/2013   HPI/Events of Note   Known paroxysmal Afib Now in afib, HD stable, comfortable  eICU Interventions  Give PM metop early Continue tele       Emonnie Cannady 03/11/2013, 6:27 PM

## 2013-03-11 NOTE — Progress Notes (Signed)
ANTIBIOTIC CONSULT NOTE - Follow-up  Pharmacy Consult for Vancomycin Indication: MRSA PNA  Allergies  Allergen Reactions  . Codeine Other (See Comments)    unknown  . Sulfa Antibiotics     unknown   Patient Measurements: Height: 5\' 2"  (157.5 cm) Weight: 117 lb 1 oz (53.1 kg) IBW/kg (Calculated) : 50.1  Vital Signs: Temp: 97.8 F (36.6 C) (02/20 0417) Temp src: Oral (02/20 0417) BP: 124/49 mmHg (02/20 0417) Pulse Rate: 77 (02/20 0417) Intake/Output from previous day: 02/19 0701 - 02/20 0700 In: 2430 [P.O.:660; I.V.:1620; IV Piggyback:150] Out: -   Labs:  Recent Labs  03/09/13 1102 03/09/13 2215 03/10/13 0630 03/11/13 0440  WBC  --  16.0* 13.0*  --   HGB  --  9.5* 8.3*  --   PLT  --  369 313  --   CREATININE 3.2*  --  3.02* 2.67*   Estimated Creatinine Clearance: 12 ml/min (by C-G formula based on Cr of 2.67).  Recent Labs  03/10/13 1825  VANCORANDOM 13.9     Assessment: 88 yoF with CKD4, MRSA PNA, culture obtained 2/6 @ [facility]. Planned treatment with po Linezolid, but patient could not afford. [facility] Vancomycin on 2/18. Pharmacy to dose Vancomycin  Antiinfectives 2/18 >> Vanc >>  Tmax: Afeb WBCs: 13 Renal: SCr 2.67, Cl ~ 8012ml/min (C-G and N)  Microbiology 2/6 sputum: (@ [facility]) MRSA, sens to Clinda,Gent,TCN,Zyvox, Vanc   Drug level / dose changes info: 2/19: 18:25 random vanco level = 13.9 mcg/ml following vanco 1gm x 1 2/18 at 21:12  Goal of Therapy:  Vancomycin trough level 15-20 mcg/ml  Plan:  - Change vancomycin 750mg  IV q48h for estimated pk = 37, trough = 19 - vancomycin trough levels as indicated - follow-up clinical course, culture results, renal function - follow-up antibiotic de-escalation and length of therapy  Thank you for the consult.  Juliette Alcideustin Zeigler, PharmD, BCPS.   Pager: [phone] 03/11/2013 9:19 AM

## 2013-03-11 NOTE — Progress Notes (Signed)
Assessment/Plan:  1. Acute renal failure chronic kidney disease stage IV:  2. Hyponatremia:  3. Acute bronchiectasis flare/MRSA sputum positive: On antibiotic therapy with vancomycin  Plan: Stop IVF Furosemide Foley  Subjective: Interval History: SOB last night with desaturation, pos 2.4L  Objective: Vital signs in last 24 hours: Temp:  [97.8 F (36.6 C)-98 F (36.7 C)] 97.8 F (36.6 C) (02/20 0417) Pulse Rate:  [77-98] 77 (02/20 0417) Resp:  [14-22] 14 (02/20 0417) BP: (112-136)/(49-52) 124/49 mmHg (02/20 0417) SpO2:  [85 %-98 %] 97 % (02/20 0637) FiO2 (%):  [50 %] 50 % (02/20 0435) Weight:  [53.1 kg (117 lb 1 oz)] 53.1 kg (117 lb 1 oz) (02/20 16100637) Weight change: 2.297 kg (5 lb 1 oz)  Intake/Output from previous day: 02/19 0701 - 02/20 0700 In: 2430 [P.O.:660; I.V.:1620; IV Piggyback:150] Out: -  Intake/Output this shift:    General appearance: alert and cooperative Chest wall: no tenderness GI: soft, non-tender; bowel sounds normal; no masses,  no organomegaly Extremities: edema 1+  Lab Results:  Recent Labs  03/09/13 2215 03/10/13 0630  WBC 16.0* 13.0*  HGB 9.5* 8.3*  HCT 27.8* 24.4*  PLT 369 313   BMET:  Recent Labs  03/10/13 0630 03/11/13 0440  NA 122* 122*  K 4.9 3.8  CL 82* 83*  CO2 25 25  GLUCOSE 100* 101*  BUN 56* 48*  CREATININE 3.02* 2.67*  CALCIUM 9.2 9.1   No results found for this basename: PTH,  in the last 72 hours Iron Studies:  Recent Labs  03/10/13 1405  IRON 42  TIBC 106*  FERRITIN 3092*   Studies/Results: Dg Chest Port 1 View  03/11/2013   CLINICAL DATA:  O'clock cm.  EXAM: PORTABLE CHEST - 1 VIEW  COMPARISON:  03/10/2013  FINDINGS: Moderate right pleural effusion and right mid and lower lung consolidation. Mild cardiomegaly. Increasing left base atelectasis or infiltrate. Left central line is unchanged. Biapical scarring.  IMPRESSION: Stable moderate right effusion and right lower lobe airspace disease.  New left basilar  atelectasis or consolidation.  Cardiomegaly, COPD.   Electronically Signed   By: Charlett NoseKevin  Dover M.D.   On: 03/11/2013 05:44   Dg Chest Port 1 View  03/10/2013   CLINICAL DATA:  Shortness of breath. Pneumonia. Congestive heart failure.  EXAM: PORTABLE CHEST - 1 VIEW  COMPARISON:  03/09/2013  FINDINGS: Moderate right pleural effusion and right lung airspace disease shows no significant change. Mild cardiomegaly stable. Left jugular central venous catheter remains in appropriate position.  IMPRESSION: No significant change compared with prior exam.   Electronically Signed   By: Myles RosenthalJohn  Stahl M.D.   On: 03/10/2013 07:10   Dg Chest Port 1 View  03/09/2013   CLINICAL DATA:  Central line placement  EXAM: PORTABLE CHEST - 1 VIEW  COMPARISON:  Earlier film of the same day  FINDINGS: Left IJ central line extends to the mid SVC. No pneumothorax. Right pleural effusion with right lower lung consolidation/ atelectasis. Patchy airspace disease in the right mid and upper lung slightly increased. Left lung remains clear. Heart size upper limits normal. Atheromatous aorta. .  Prominent costochondral calcifications.  IMPRESSION: 1. Central line to mid SVC without pneumothorax. 2. Slight worsening of right lung airspace disease, with persistent effusion.   Electronically Signed   By: Oley Balmaniel  Hassell M.D.   On: 03/09/2013 19:58   Dg Chest Port 1 View  03/09/2013   CLINICAL DATA:  Cough  EXAM: PORTABLE CHEST - 1 VIEW  COMPARISON:  02/14/2013  FINDINGS: New right pleural effusion with progressive consolidation/ atelectasis in the right lower lung. Patchy poorly marginated airspace opacities in the right upper lung and right middle lobe have also progressed. . Left lung remains clear. Heart size upper limits normal for technique. Regional bones unremarkable.  IMPRESSION: 1. Worsening right lung airspace disease most marked at the lung base with progressive pleural effusion.   Electronically Signed   By: Oley Balm M.D.   On:  03/09/2013 16:36   Scheduled: . albuterol  2.5 mg Nebulization Q4H  . amLODipine  5 mg Oral Daily  . calcitRIOL  0.25 mcg Oral Daily  . darbepoetin (ARANESP) injection - NON-DIALYSIS  40 mcg Subcutaneous Q Thu-1800  . feeding supplement (ENSURE COMPLETE)  237 mL Oral Q1400  . feeding supplement (ENSURE)  1 Container Oral TID BM  . levothyroxine  75 mcg Oral QAC breakfast  . metoprolol tartrate  100 mg Oral BID  . multivitamin with minerals  1 tablet Oral Daily  . oxybutynin  10 mg Oral QHS  . pantoprazole  40 mg Oral Daily  . sertraline  25 mg Oral Daily  . sodium chloride  10-40 mL Intracatheter Q12H  . vancomycin  750 mg Intravenous Q2000  . Warfarin - Pharmacist Dosing Inpatient   Does not apply q1800     LOS: 2 days   Derita Michelsen C 03/11/2013,8:57 AM

## 2013-03-11 NOTE — Progress Notes (Signed)
ANTICOAGULATION CONSULT NOTE - Follow Up Consult  Pharmacy Consult for warfarin Indication: atrial fibrillation  Allergies  Allergen Reactions  . Codeine Other (See Comments)    unknown  . Sulfa Antibiotics     unknown    Patient Measurements: Height: 5\' 2"  (157.5 cm) Weight: 117 lb 1 oz (53.1 kg) IBW/kg (Calculated) : 50.1 Heparin Dosing Weight:   Vital Signs: Temp: 97.8 F (36.6 C) (02/20 0417) Temp src: Oral (02/20 0417) BP: 124/49 mmHg (02/20 0417) Pulse Rate: 77 (02/20 0417)  Labs:  Recent Labs  03/09/13 1102 03/09/13 1755 03/09/13 2215 03/10/13 0630 03/11/13 0440  HGB  --   --  9.5* 8.3*  --   HCT  --   --  27.8* 24.4*  --   PLT  --   --  369 313  --   LABPROT  --  29.8*  --  34.2* 29.4*  INR  --  2.96*  --  3.55* 2.91*  CREATININE 3.2*  --   --  3.02* 2.67*    Estimated Creatinine Clearance: 12 ml/min (by C-G formula based on Cr of 2.67).   Assessment: [age] YOF  [facility], orders for pharmacy to continue warfarin therapy fro h/o afib.  Home dose reported as 3mg  daily  Today's INR = 2.91 (therapeutic), dose held  CBC: no CBC this am, 2/19: Hgb = 8.3 (down from admission value), Platelets WNL  No significant drug interactions noted  Started on Aranesp (on Epo prior to admission)  Goal of Therapy:  INR 2-3 Monitor platelets by anticoagulation protocol: Yes   Plan:   Warfarin 1.5mg  po x 1 tonight (using lower dose compared with home)  Daily INR  Juliette Alcideustin Crystallee Werden, PharmD, BCPS.   Pager: [phone]  03/11/2013,10:07 AM

## 2013-03-11 NOTE — Progress Notes (Signed)
Name: Cheryl [name] MRN: [id number] DOB: 07/10/1925    ADMISSION DATE:  03/09/2013  PRIMARY SERVICE:  PCCM  CHIEF COMPLAINT:  SOB, MRSA PNA  BRIEF PATIENT DESCRIPTION: 80 y/o F [facility] with productive cough, LE Swelling, & sputum positive for MRSA.    SIGNIFICANT EVENTS / STUDIES:  09/14/12 - [facility], complete occlusion of RML bronchus.  CT concerning for endobronchial lesion 09/21/12 - FOB: non-diagnostic> single focus abnormal glandular tissue, unable to definitively call malignancy 02/26/12 - [facility]chiectasis flare. Sputum culture obtained.  Pt empirically treated with Cipro. .................................................................................................................................................................................. 03/09/12 - seen in office by RB - c/o increased LE swelling, cough with dark sputum production.  Sputum noted to be positive for MRSA.  Hx of enterobacter in past (none this culture). Rx called for zyvox but pt unable to afford (>1500$).  [facility].     CULTURES: Sputum 2/6>>>abundant MRSA  ANTIBIOTICS: Vancomycin (MRSA) 2/18>>>   SUBJECTIVE: afebrile Increased hypoxia overnight Cough persists  VITAL SIGNS: Temp:  [97.8 F (36.6 C)-98.1 F (36.7 C)] 98.1 F (36.7 C) (02/20 1135) Pulse Rate:  [70-98] 70 (02/20 1135) Resp:  [14-22] 16 (02/20 1135) BP: (112-136)/(49-52) 124/49 mmHg (02/20 0417) SpO2:  [85 %-100 %] 100 % (02/20 1135) FiO2 (%):  [50 %] 50 % (02/20 0435) Weight:  [53.1 kg (117 lb 1 oz)] 53.1 kg (117 lb 1 oz) (02/20 [id number])  PHYSICAL EXAMINATION: Gen: Pleasant, well-nourished, in no distress, kyphotic  ENT: No lesions, mouth clear, oropharynx clear, no postnasal drip  Neck: No JVD, no TMG, no carotid bruits  Lungs: No use of accessory muscles, clear without rales or rhonchi, hoarse voice Cardiovascular: RRR, heart sounds  normal, no murmur or gallops, no peripheral edema  Musculoskeletal: No deformities, no cyanosis or clubbing  Neuro: alert, non focal, poor memory Skin: Warm, no lesions or rashes   Recent Labs Lab 03/09/13 1102 03/10/13 0630 03/11/13 0440  NA 119* 122* 122*  K 4.9 4.9 3.8  CL 80* 82* 83*  CO2 27 25 25   BUN 52* 56* 48*  CREATININE 3.2* 3.02* 2.67*  GLUCOSE 100* 100* 101*    Recent Labs Lab 03/09/13 2215 03/10/13 0630  HGB 9.5* 8.3*  HCT 27.8* 24.4*  WBC 16.0* 13.0*  PLT 369 313   Dg Chest Port 1 View  03/11/2013   CLINICAL DATA:  O'clock cm.  EXAM: PORTABLE CHEST - 1 VIEW  COMPARISON:  03/10/2013  FINDINGS: Moderate right pleural effusion and right mid and lower lung consolidation. Mild cardiomegaly. Increasing left base atelectasis or infiltrate. Left central line is unchanged. Biapical scarring.  IMPRESSION: Stable moderate right effusion and right lower lobe airspace disease.  New left basilar atelectasis or consolidation.  Cardiomegaly, COPD.   Electronically Signed   By: Charlett NoseKevin  Dover M.D.   On: 03/11/2013 05:44   Dg Chest Port 1 View  03/10/2013   CLINICAL DATA:  Shortness of breath. Pneumonia. Congestive heart failure.  EXAM: PORTABLE CHEST - 1 VIEW  COMPARISON:  03/09/2013  FINDINGS: Moderate right pleural effusion and right lung airspace disease shows no significant change. Mild cardiomegaly stable. Left jugular central venous catheter remains in appropriate position.  IMPRESSION: No significant change compared with prior exam.   Electronically Signed   By: Myles RosenthalJohn  Stahl M.D.   On: 03/10/2013 07:10   Dg Chest Port 1 View  03/09/2013   CLINICAL DATA:  Central line placement  EXAM: PORTABLE CHEST - 1 VIEW  COMPARISON:  Earlier film of the same day  FINDINGS: Left IJ central line extends to the mid SVC. No pneumothorax. Right pleural effusion with right lower lung consolidation/ atelectasis. Patchy airspace disease in the right mid and upper lung slightly increased. Left lung  remains clear. Heart size upper limits normal. Atheromatous aorta. .  Prominent costochondral calcifications.  IMPRESSION: 1. Central line to mid SVC without pneumothorax. 2. Slight worsening of right lung airspace disease, with persistent effusion.   Electronically Signed   By: Oley Balm M.D.   On: 03/09/2013 19:58   Dg Chest Port 1 View  03/09/2013   CLINICAL DATA:  Cough  EXAM: PORTABLE CHEST - 1 VIEW  COMPARISON:  02/14/2013  FINDINGS: New right pleural effusion with progressive consolidation/ atelectasis in the right lower lung. Patchy poorly marginated airspace opacities in the right upper lung and right middle lobe have also progressed. . Left lung remains clear. Heart size upper limits normal for technique. Regional bones unremarkable.  IMPRESSION: 1. Worsening right lung airspace disease most marked at the lung base with progressive pleural effusion.   Electronically Signed   By: Oley Balm M.D.   On: 03/09/2013 16:36    ASSESSMENT / PLAN: RLL pneumonia Bronchiectasis , without acute bronchospasm  MRSA Colonization PAH - PASP 37 01/2010 Hx PE  Plan: -renal dose vancomycin q 48h -DNR / DNI -Pharmacy consult for coumadin mgmt   ESRD (end stage renal disease)  - baseline sr cr 2.5 AVF - not on HD Hyponatremia - urine na 18 , favor diuretic induced  Plan: -trend BMP for BUN / Sr Cr -continue calcitriol -dietary supplement -renal following, IVFs stopped & lasix resumed  Chronic Diastolic CHF PAF HTN  Plan: -continue home norvasc, metoprolol -hold home lasix (40 BID baseline) -coumadin as above  Hypothyroidism   Plan: -continue synthroid  Overactive Bladder  Plan: -continue ditropan  Depression   Plan: -continue zoloft   Coagulopathy -chronic coumadin administration  Plan: -coumadin per pharmacy   Summary - RLL mRSA pna appears worse - ? Fluid overload ? Is there underlying mycobacterial infection (note sp afb neg i the past)   Therman Hughlett  V.  [phone]  03/11/2013, 12:03 PM

## 2013-03-11 NOTE — Progress Notes (Signed)
This morning, patient's oxygen sat. Was 85% on 2L. Patient alert and oriented. RN had to bump patient to a 50% venti mask. Respiratory was notified and came to assess patient. Albuterol treatment was given to patient. Doctor was notified and new orders were given for a chest xray, flutter valve, and PRN breathing treatments. After the treatment, patient oxygen is staying at 97-97% on 6L [place]. Will continue to monitor patient.

## 2013-03-11 NOTE — Progress Notes (Signed)
[place] Agree with AM assessment. Pt resting comfortably in bed. Offers no c/o. Foley draining clear, yellow urine. Bed alarm on. Contact precautions maintained.

## 2013-03-11 NOTE — Progress Notes (Signed)
Pt noted to be in afib with HR 110-120s on tele. EKG confirmed afib w/ RVR, rate of 112bpm. Pt asymptomatic. Sitting up in bed, eating dinner in no distress. VSS. Pt has hx of afib and is on daily coumadin. Pt received 100mg  lopressor this AM and has another dose due this evening. Dr Kendrick FriesMcQuaid made aware of all of above and instructs to give evening dose of lopressor now.

## 2013-03-12 DIAGNOSIS — R5383 Other fatigue: Secondary | ICD-10-CM

## 2013-03-12 DIAGNOSIS — I4891 Unspecified atrial fibrillation: Secondary | ICD-10-CM

## 2013-03-12 DIAGNOSIS — R5381 Other malaise: Secondary | ICD-10-CM

## 2013-03-12 LAB — RENAL FUNCTION PANEL
Albumin: 1.9 g/dL — ABNORMAL LOW (ref 3.5–5.2)
BUN: 43 mg/dL — ABNORMAL HIGH (ref 6–23)
CALCIUM: 9.2 mg/dL (ref 8.4–10.5)
CO2: 27 mEq/L (ref 19–32)
CREATININE: 2.43 mg/dL — AB (ref 0.50–1.10)
Chloride: 82 mEq/L — ABNORMAL LOW (ref 96–112)
GFR, EST AFRICAN AMERICAN: 20 mL/min — AB (ref >90)
GFR, EST NON AFRICAN AMERICAN: 17 mL/min — AB (ref >90)
Glucose, Bld: 100 mg/dL — ABNORMAL HIGH (ref 70–99)
PHOSPHORUS: 5.3 mg/dL — AB (ref 2.3–4.6)
Potassium: 3.8 mEq/L (ref 3.7–5.3)
SODIUM: 123 meq/L — AB (ref 137–147)

## 2013-03-12 LAB — PROTIME-INR
INR: 3.02 — AB (ref 0.00–1.49)
Prothrombin Time: 30.2 seconds — ABNORMAL HIGH (ref 11.6–15.2)

## 2013-03-12 LAB — VANCOMYCIN, TROUGH: Vancomycin Tr: 14.4 ug/mL (ref 10.0–20.0)

## 2013-03-12 LAB — MAGNESIUM: Magnesium: 1.8 mg/dL (ref 1.5–2.5)

## 2013-03-12 MED ORDER — GUAIFENESIN ER 600 MG PO TB12
1200.0000 mg | ORAL_TABLET | Freq: Two times a day (BID) | ORAL | Status: DC
  Administered 2013-03-12 – 2013-03-14 (×5): 1200 mg via ORAL
  Filled 2013-03-12 (×6): qty 2

## 2013-03-12 MED ORDER — WARFARIN 0.5 MG HALF TABLET
0.5000 mg | ORAL_TABLET | Freq: Once | ORAL | Status: AC
  Administered 2013-03-12: 0.5 mg via ORAL
  Filled 2013-03-12: qty 1

## 2013-03-12 NOTE — Evaluation (Signed)
Physical Therapy Evaluation Patient Details Name: Cheryl [name] MRN: [id number] DOB: 07/08/1925 Today's Date: 03/12/2013 Time: [id number] PT Time Calculation (min): 20 min  PT Assessment / Plan / Recommendation History of Present Illness  80 yo female admitted with acute renal failure, SOB, bronchiectesis with bronchitis, pna. Hx of HTN, P AFIB, PE, pulm HTN, L LE IM nail  Clinical Impression  On eval, pt required Min assist for mobility-able to perform stand pivot with walker bed>recliner. Pt declined to ambulate at this time. Recommend HHPT, 24/7 supervision    PT Assessment  Patient needs continued PT services    Follow Up Recommendations  Home health PT;Supervision/Assistance - 24 hour    Does the patient have the potential to tolerate intense rehabilitation      Barriers to Discharge        Equipment Recommendations  None recommended by PT    Recommendations for Other Services     Frequency Min 3X/week    Precautions / Restrictions Precautions Precautions: Fall Restrictions Weight Bearing Restrictions: No   Pertinent Vitals/Pain No c/o pain      Mobility  Bed Mobility Overal bed mobility: Needs Assistance Bed Mobility: Supine to Sit Supine to sit: HOB elevated;Min guard General bed mobility comments: Increased time. close guard for safety Transfers Overall transfer level: Needs assistance Equipment used: Rolling walker (2 wheeled) Transfers: Sit to/from UGI CorporationStand;Stand Pivot Transfers Sit to Stand: Min assist Stand pivot transfers: Min assist General transfer comment: Assist to rise, stabilize, control descent, maneuver with walker Ambulation/Gait General Gait Details: pt declined to ambulate at this time.    Exercises     PT Diagnosis: Difficulty walking;Generalized weakness  PT Problem List: Decreased strength;Decreased activity tolerance;Decreased balance;Decreased mobility;Decreased knowledge of use of DME PT Treatment Interventions: DME  instruction;Gait training;Functional mobility training;Therapeutic activities;Therapeutic exercise;Patient/family education;Balance training     PT Goals(Current goals can be found in the care plan section) Acute Rehab PT Goals Patient Stated Goal: home soon. feel better PT Goal Formulation: With patient/family Time For Goal Achievement: 03/26/13 Potential to Achieve Goals: Good  Visit Information  Last PT Received On: 03/12/13 Assistance Needed: +2 History of Present Illness: 80 yo female admitted with acute renal failure, SOB, bronchiectesis with bronchitis, pna. Hx of HTN, P AFIB, PE, pulm HTN, L LE IM nail       Prior Functioning  Home Living Family/patient expects to be discharged to:: Private residence Living Arrangements: Children Available Help at Discharge: Family;Available 24 hours/day Type of Home: House Home Access: Stairs to enter Entergy CorporationEntrance Stairs-Number of Steps: 3 Entrance Stairs-Rails: Right;Left Home Layout: One level Home Equipment: Hospital bed;Bedside commode;Walker - 2 wheels;Cane - single point;Tub bench Additional Comments: Has been living with her daughter since June '14. Communication Communication: HOH Dominant Hand: Right    Cognition  Cognition Arousal/Alertness: Awake/[name] Behavior During Therapy: WFL for tasks assessed/performed Overall Cognitive Status: Within Functional Limits for tasks assessed    Extremity/Trunk Assessment Upper Extremity Assessment Upper Extremity Assessment: Generalized weakness Lower Extremity Assessment Lower Extremity Assessment: Generalized weakness Cervical / Trunk Assessment Cervical / Trunk Assessment: Kyphotic   Balance    End of Session PT - End of Session Activity Tolerance: Patient tolerated treatment well Patient left: in chair;with call bell/phone within reach;with family/visitor present (informed family to notify nursing if they leave with pt in chair)  GP     Cheryl AlertJannie Sherrill [name], MPT Pager:  [phone]

## 2013-03-12 NOTE — Progress Notes (Signed)
Pt noted to have converted back into Afib about 30 minutes ago after P.T. Got her up to a chair.  Rate controlled between 100-110, pt asymptomatic.  Dr Kendrick FriesMcquaid aware and pt on appropriate measures.  Dr Kriste BasqueNadel to round today.  Will continue to monitor

## 2013-03-12 NOTE — Progress Notes (Signed)
Pt has had large amts of urine leakage not counted in foley..? Leakage with such hard coughing? But foley replaced with a new one at this time in case of faulty catheter

## 2013-03-12 NOTE — Progress Notes (Signed)
ANTICOAGULATION CONSULT NOTE - Follow Up Consult  Pharmacy Consult for warfarin Indication: atrial fibrillation  Allergies  Allergen Reactions  . Codeine Other (See Comments)    unknown  . Sulfa Antibiotics     unknown   Patient Measurements: Height: 5\' 2"  (157.5 cm) Weight: 116 lb 10 oz (52.9 kg) IBW/kg (Calculated) : 50.1  Vital Signs: Temp: 98.4 F (36.9 C) (02/21 0548) Temp src: Oral (02/21 0548) BP: 106/70 mmHg (02/21 0548) Pulse Rate: 98 (02/21 0548)  Labs:  Recent Labs  03/09/13 2215 03/10/13 0630 03/11/13 0440 03/12/13 0330  HGB 9.5* 8.3*  --   --   HCT 27.8* 24.4*  --   --   PLT 369 313  --   --   LABPROT  --  34.2* 29.4* 30.2*  INR  --  3.55* 2.91* 3.02*  CREATININE  --  3.02* 2.67* 2.43*   Assessment: 88 YOF  [facility] with MRSA PNA, orders for pharmacy to continue warfarin therapy for h/o afib. Admit INR 2.96, no Warfarin given day of admit. Home dose reported as 3mg  daily, last dose unknown. Started on Aranesp (on Epo prior to admission)  In and out of Afib, metoprolol x1 last night.  Today's INR = 3.02, Warfarin 1.5mg  given 2/20  2/19: Hgb = 8.3 (down from admission value), Platelets WNL  No significant drug interactions noted  Po intake note 20%  Goal of Therapy:  INR 2-3 Monitor platelets by anticoagulation protocol: Yes   Plan:   Warfarin 0.5mg  today, (hesitate to hold Warfarin completely)  Daily INR  Otho BellowsGreen, Delaynee Alred L PharmD Pager [phone] 03/12/2013, 10:52 AM

## 2013-03-12 NOTE — Progress Notes (Signed)
Assessment/Plan:  1. Acute renal failure chronic kidney disease stage IV:  2. Hyponatremia:  3. Acute bronchiectasis flare/MRSA sputum positive: On antibiotic therapy with vancomycin  Plan:   Continue Furosemide    Subjective: Interval History: good UOP  Objective: Vital signs in last 24 hours: Temp:  [97.7 F (36.5 C)-98.4 F (36.9 C)] 98.4 F (36.9 C) (02/21 0548) Pulse Rate:  [70-123] 98 (02/21 0548) Resp:  [14-20] 18 (02/21 0548) BP: (106-115)/(42-70) 106/70 mmHg (02/21 0548) SpO2:  [94 %-100 %] 100 % (02/21 0837) Weight:  [52.9 kg (116 lb 10 oz)] 52.9 kg (116 lb 10 oz) (02/21 0548) Weight change: -0.2 kg (-7.1 oz)  Intake/Output from previous day: 02/20 0701 - 02/21 0700 In: 600 [P.O.:600] Out: 2500 [Urine:2500] Intake/Output this shift:    General appearance: alert and cooperative Chest wall: no tenderness; tr to no presacral edema GI: soft, non-tender; bowel sounds normal; no masses,  no organomegaly Extremities: edema trace  Lab Results:  Recent Labs  03/09/13 2215 03/10/13 0630  WBC 16.0* 13.0*  HGB 9.5* 8.3*  HCT 27.8* 24.4*  PLT 369 313   BMET:  Recent Labs  03/11/13 0440 03/12/13 0330  NA 122* 123*  K 3.8 3.8  CL 83* 82*  CO2 25 27  GLUCOSE 101* 100*  BUN 48* 43*  CREATININE 2.67* 2.43*  CALCIUM 9.1 9.2   No results found for this basename: PTH,  in the last 72 hours Iron Studies:  Recent Labs  03/10/13 1405  IRON 42  TIBC 106*  FERRITIN 3092*   Studies/Results: Dg Chest Port 1 View  03/11/2013   CLINICAL DATA:  O'clock cm.  EXAM: PORTABLE CHEST - 1 VIEW  COMPARISON:  03/10/2013  FINDINGS: Moderate right pleural effusion and right mid and lower lung consolidation. Mild cardiomegaly. Increasing left base atelectasis or infiltrate. Left central line is unchanged. Biapical scarring.  IMPRESSION: Stable moderate right effusion and right lower lobe airspace disease.  New left basilar atelectasis or consolidation.  Cardiomegaly, COPD.    Electronically Signed   By: Charlett NoseKevin  Dover M.D.   On: 03/11/2013 05:44   Scheduled: . albuterol  2.5 mg Nebulization Q4H  . amLODipine  5 mg Oral Daily  . calcitRIOL  0.25 mcg Oral Daily  . darbepoetin (ARANESP) injection - NON-DIALYSIS  40 mcg Subcutaneous Q Thu-1800  . feeding supplement (ENSURE COMPLETE)  237 mL Oral Q1400  . feeding supplement (ENSURE)  1 Container Oral TID BM  . furosemide  60 mg Intravenous BID  . levothyroxine  75 mcg Oral QAC breakfast  . metoprolol tartrate  100 mg Oral BID  . multivitamin with minerals  1 tablet Oral Daily  . nystatin  5 mL Oral TID  . oxybutynin  10 mg Oral QHS  . pantoprazole  40 mg Oral Daily  . sertraline  25 mg Oral Daily  . sodium chloride  10-40 mL Intracatheter Q12H  . vancomycin  750 mg Intravenous Q48H  . Warfarin - Pharmacist Dosing Inpatient   Does not apply q1800     LOS: 3 days   Darwyn Ponzo C 03/12/2013,9:27 AM

## 2013-03-12 NOTE — Progress Notes (Signed)
PULMONARY PROGRESS NOTE  Name: Cheryl [name] MRN: [id number] DOB: 1925-09-18    ADMISSION DATE:  03/09/2013  PRIMARY SERVICE:  PCCM  CHIEF COMPLAINT:  SOB, MRSA PNA  BRIEF PATIENT DESCRIPTION: 80 y/o F direct admitted to [facility] on 2/18 with productive cough, LE Swelling, & sputum positive for MRSA.    SIGNIFICANT EVENTS / STUDIES:  09/14/12 - admit to [facility] for PNA, complete occlusion of RML bronchus.  CT concerning for endobronchial lesion 09/21/12 - FOB: non-diagnostic> single focus abnormal glandular tissue, unable to definitively call malignancy 02/26/12 - seen by [facility] for acute bronchiectasis flare. Sputum culture obtained=> MRSA (empirically treated with Cipro) .................................................................................................................................................................................. 03/09/12 - seen in office by RB - c/o increased LE swelling, cough with dark sputum production.  Sputum noted to be positive for MRSA.  Hx of enterobacter in past (none this culture). Rx called for Zyvox but pt unable to afford (>1500$).  Direct admit to [facility] for further work up.     CULTURES: Sputum 2/6>>>abundant MRSA  ANTIBIOTICS: Vancomycin (MRSA) 2/18>>>   SUBJECTIVE: afebrile Increased hypoxia overnight Cough persists  VITAL SIGNS: Temp:  [97.7 F (36.5 C)-98.4 F (36.9 C)] 98.4 F (36.9 C) (02/21 0548) Pulse Rate:  [70-123] 98 (02/21 0548) Resp:  [14-20] 18 (02/21 0548) BP: (106-115)/(42-70) 106/70 mmHg (02/21 0548) SpO2:  [94 %-100 %] 100 % (02/21 0837) Weight:  [52.9 kg (116 lb 10 oz)] 52.9 kg (116 lb 10 oz) (02/21 0548)  PHYSICAL EXAMINATION: Gen: Pleasant, well-nourished, in no distress, kyphotic  ENT: No lesions, mouth clear, oropharynx clear, no postnasal drip  Neck: No JVD, no TMG, no carotid bruits  Lungs: No use of accessory muscles, clear without rales or rhonchi, hoarse voice Cardiovascular: RRR, heart sounds normal, no  murmur or gallops, no peripheral edema  Musculoskeletal: No deformities, no cyanosis or clubbing  Neuro: alert, non focal, poor memory Skin: Warm, no lesions or rashes   Recent Labs Lab 03/10/13 0630 03/11/13 0440 03/12/13 0330  NA 122* 122* 123*  K 4.9 3.8 3.8  CL 82* 83* 82*  CO2 25 25 27   BUN 56* 48* 43*  CREATININE 3.02* 2.67* 2.43*  GLUCOSE 100* 101* 100*    Recent Labs Lab 03/09/13 2215 03/10/13 0630  HGB 9.5* 8.3*  HCT 27.8* 24.4*  WBC 16.0* 13.0*  PLT 369 313   Dg Chest Port 1 View  03/11/2013   CLINICAL DATA:  O'clock cm.  EXAM: PORTABLE CHEST - 1 VIEW  COMPARISON:  03/10/2013  FINDINGS: Moderate right pleural effusion and right mid and lower lung consolidation. Mild cardiomegaly. Increasing left base atelectasis or infiltrate. Left central line is unchanged. Biapical scarring.  IMPRESSION: Stable moderate right effusion and right lower lobe airspace disease.  New left basilar atelectasis or consolidation.  Cardiomegaly, COPD.   Electronically Signed   By: Charlett Nose M.D.   On: 03/11/2013 05:44    ASSESSMENT / PLAN:  RLL pneumonia & Effusion (new this adm) Bronchiectasis , without acute bronchospasm  Sputum +MRSA  PAH - PASP 37 01/2010 Hx PE Plan: -renal dose vancomycin q 48h per pharm -NEBS w/ [id number] -DNR / DNI -Pharmacy consult for coumadin mgmt -add Mucinex   ESRD (end stage renal disease)  - baseline sr cr 2.5 AVF - not on HD Hyponatremia - urine na 18 , favor diuretic induced Plan: -trend BMP for BUN / Sr Cr -continue calcitriol -dietary supplement -renal following, IVFs stopped & lasix resumed  Chronic Diastolic CHF PAF HTN Plan: -continue home norvasc, metoprolol -  on IV Lasix (60mg Bid) per Nephrology -coumadin as above  Hypothyroidism  Plan: -continue synthroid  Overactive Bladder Plan: -continue ditropan  Depression  Plan: -continue zoloft  Coagulopathy -chronic coumadin administration Plan: -coumadin per pharmacy     Summary - RLL mRSA pna appears worse - ? Fluid overload ? Is there underlying mycobacterial infection (note sp afb neg i the past)   Anisa Leanos M 03/12/2013, 12:48 PM

## 2013-03-12 NOTE — Progress Notes (Signed)
Back to NSR

## 2013-03-13 DIAGNOSIS — E871 Hypo-osmolality and hyponatremia: Secondary | ICD-10-CM

## 2013-03-13 DIAGNOSIS — E43 Unspecified severe protein-calorie malnutrition: Secondary | ICD-10-CM

## 2013-03-13 LAB — RENAL FUNCTION PANEL
Albumin: 1.8 g/dL — ABNORMAL LOW (ref 3.5–5.2)
BUN: 42 mg/dL — ABNORMAL HIGH (ref 6–23)
CO2: 29 meq/L (ref 19–32)
CREATININE: 2.22 mg/dL — AB (ref 0.50–1.10)
Calcium: 9.1 mg/dL (ref 8.4–10.5)
Chloride: 82 mEq/L — ABNORMAL LOW (ref 96–112)
GFR calc Af Amer: 22 mL/min — ABNORMAL LOW (ref >90)
GFR calc non Af Amer: 19 mL/min — ABNORMAL LOW (ref >90)
Glucose, Bld: 101 mg/dL — ABNORMAL HIGH (ref 70–99)
POTASSIUM: 3.7 meq/L (ref 3.7–5.3)
Phosphorus: 4.8 mg/dL — ABNORMAL HIGH (ref 2.3–4.6)
Sodium: 123 mEq/L — ABNORMAL LOW (ref 137–147)

## 2013-03-13 LAB — MAGNESIUM: MAGNESIUM: 1.8 mg/dL (ref 1.5–2.5)

## 2013-03-13 LAB — CBC
HCT: 24.1 % — ABNORMAL LOW (ref 36.0–46.0)
Hemoglobin: 8.6 g/dL — ABNORMAL LOW (ref 12.0–15.0)
MCH: 35 pg — ABNORMAL HIGH (ref 26.0–34.0)
MCHC: 35.7 g/dL (ref 30.0–36.0)
MCV: 98 fL (ref 78.0–100.0)
Platelets: 277 10*3/uL (ref 150–400)
RBC: 2.46 MIL/uL — AB (ref 3.87–5.11)
RDW: 18.2 % — AB (ref 11.5–15.5)
WBC: 12.3 10*3/uL — ABNORMAL HIGH (ref 4.0–10.5)

## 2013-03-13 LAB — PROTIME-INR
INR: 2.9 — ABNORMAL HIGH (ref 0.00–1.49)
Prothrombin Time: 29.3 seconds — ABNORMAL HIGH (ref 11.6–15.2)

## 2013-03-13 MED ORDER — LACTULOSE 10 GM/15ML PO SOLN
15.0000 g | Freq: Two times a day (BID) | ORAL | Status: DC | PRN
  Administered 2013-03-13: 15 g via ORAL
  Filled 2013-03-13: qty 30

## 2013-03-13 MED ORDER — ALBUTEROL SULFATE (2.5 MG/3ML) 0.083% IN NEBU
2.5000 mg | INHALATION_SOLUTION | Freq: Four times a day (QID) | RESPIRATORY_TRACT | Status: DC
  Administered 2013-03-14 – 2013-03-18 (×16): 2.5 mg via RESPIRATORY_TRACT
  Filled 2013-03-13 (×17): qty 3

## 2013-03-13 MED ORDER — WARFARIN 0.5 MG HALF TABLET
0.5000 mg | ORAL_TABLET | Freq: Once | ORAL | Status: AC
  Administered 2013-03-13: 0.5 mg via ORAL
  Filled 2013-03-13: qty 1

## 2013-03-13 MED ORDER — FUROSEMIDE 80 MG PO TABS
80.0000 mg | ORAL_TABLET | Freq: Two times a day (BID) | ORAL | Status: DC
  Administered 2013-03-13 – 2013-03-14 (×2): 80 mg via ORAL
  Filled 2013-03-13 (×4): qty 1

## 2013-03-13 NOTE — Progress Notes (Signed)
Checked room air sats after rolling pt in bed to change linens. Sat=86%, O2 at 2liters now.  Pt is having incontinence with foley due to coughing

## 2013-03-13 NOTE — Progress Notes (Signed)
ANTICOAGULATION CONSULT NOTE - Follow Up Consult  Pharmacy Consult for warfarin Indication: atrial fibrillation  Allergies  Allergen Reactions  . Codeine Other (See Comments)    unknown  . Sulfa Antibiotics     unknown   Patient Measurements: Height: 5\' 2"  (157.5 cm) Weight: 111 lb 15.9 oz (50.8 kg) IBW/kg (Calculated) : 50.1  Vital Signs: Temp: 98.2 F (36.8 C) (02/22 0556) Temp src: Oral (02/22 0556) BP: 116/43 mmHg (02/22 0556) Pulse Rate: 80 (02/22 0556)  Labs:  Recent Labs  03/11/13 0440 03/12/13 0330 03/13/13 0445  HGB  --   --  8.6*  HCT  --   --  24.1*  PLT  --   --  277  LABPROT 29.4* 30.2* 29.3*  INR 2.91* 3.02* 2.90*  CREATININE 2.67* 2.43* 2.22*   Assessment: 88 YOF  Presents from [facility] 2/18 with MRSA PNA, orders for pharmacy to continue warfarin therapy for h/o afib. Admit INR 2.96, no Warfarin given day of admit. Home dose reported as 3mg  daily, last dose unknown. Started on Aranesp (on Epo prior to admission)  In and out of Afib, metoprolol x1 2/20 pm.  Today's INR = 2.90, Warfarin from 2/20: 1.5, 0.5mg   2/22: Hgb = 8.6 (down from admission value), Platelets WNL  No significant drug interactions noted  Po intake note ~30%  Goal of Therapy:  INR 2-3 Monitor platelets by anticoagulation protocol: Yes   Plan:   Warfarin 0.5mg  today, (hesitate to hold Warfarin completely)  Daily INR  Otho BellowsGreen, Breanne Olvera L PharmD Pager [phone] 03/13/2013, 11:11 AM

## 2013-03-13 NOTE — Progress Notes (Signed)
ANTIBIOTIC CONSULT NOTE - Follow-up  Pharmacy Consult for Vancomycin Indication: MRSA PNA  Allergies  Allergen Reactions  . Codeine Other (See Comments)    unknown  . Sulfa Antibiotics     unknown   Labs:  Recent Labs  03/11/13 0440 03/12/13 0330 03/13/13 0445  WBC  --   --  12.3*  HGB  --   --  8.6*  PLT  --   --  277  CREATININE 2.67* 2.43* 2.22*   Estimated Creatinine Clearance: 14.4 ml/min (by C-G formula based on Cr of 2.22).  Recent Labs  03/10/13 1825 03/12/13 2110  VANCOTROUGH  --  14.4  VANCORANDOM 13.9  --     Assessment: 88 yoF with CKD4, MRSA PNA, culture obtained 2/6 @ [place]. Planned treatment with po Linezolid, but patient could not afford. [facility] 2/18. Pharmacy to dose Vancomycin  Antiinfectives 2/18 >> Vanc >>  Tmax: Afeb WBCs: 12.3 Renal: SCr 2.22  Microbiology 2/6 sputum: (@ [place]) MRSA, sens to Clinda,Gent,TCN,Zyvox, Vanc   Drug level / dose changes info: 2/19: 18:25 random vanco level = 13.9 mcg/ml following vanco 1gm x 1 2/18 at 21:12 2/21: ~ trough level 14.4 mcg/ml, Ok to continue Vancomycin 750mg  q48h  Goal of Therapy:  Vancomycin trough level 15-20 mcg/ml  Plan:  Continue Vancomycin 750mg  IV q48h for estimated pk = 37, trough = 19 - vancomycin trough levels as indicated - follow-up clinical course, culture results, renal function - follow-up antibiotic de-escalation and length of therapy  Thank you for the consult  Otho BellowsGreen, Tacara Hadlock L PharmD Pager [phone] 03/13/2013, 11:19 AM

## 2013-03-13 NOTE — Progress Notes (Signed)
PULMONARY PROGRESS NOTE  Name: Cheryl [name] L [place] MRN: [id number] DOB: 07/10/1925    ADMISSION DATE:  03/09/2013  PRIMARY SERVICE:  PCCM  CHIEF COMPLAINT:  SOB, MRSA PNA  BRIEF PATIENT DESCRIPTION: 80 y/o F [facility] with productive cough, LE Swelling, & sputum positive for MRSA.    SIGNIFICANT EVENTS / STUDIES:  09/14/12 - [facility], complete occlusion of RML bronchus.  CT concerning for endobronchial lesion 09/21/12 - FOB: non-diagnostic> single focus abnormal glandular tissue, unable to definitively call malignancy 02/26/12 - [facility] flare. Sputum culture obtained=> MRSA (empirically treated with Cipro) .................................................................................................................................................................................. 03/09/12 - seen in office by RB - c/o increased LE swelling, cough with dark sputum production.  Sputum noted to be positive for MRSA.  Hx of enterobacter in past (none this culture). Rx called for Zyvox but pt unable to afford (>1500$).  [facility].     CULTURES: Sputum 2/6>>>abundant MRSA  ANTIBIOTICS: Vancomycin (MRSA) 2/18>>>  Meds Scheduled:  . albuterol  2.5 mg Nebulization Q4H  . amLODipine  5 mg Oral Daily  . calcitRIOL  0.25 mcg Oral Daily  . darbepoetin (ARANESP) injection - NON-DIALYSIS  40 mcg Subcutaneous Q Thu-1800  . feeding supplement (ENSURE COMPLETE)  237 mL Oral Q1400  . feeding supplement (ENSURE)  1 Container Oral TID BM  . furosemide  80 mg Oral BID  . guaiFENesin  1,200 mg Oral BID  . levothyroxine  75 mcg Oral QAC breakfast  . metoprolol tartrate  100 mg Oral BID  . multivitamin with minerals  1 tablet Oral Daily  . nystatin  5 mL Oral TID  . oxybutynin  10 mg Oral QHS  . pantoprazole  40 mg Oral Daily  . sertraline  25 mg Oral Daily  . sodium chloride  10-40 mL Intracatheter Q12H  .  vancomycin  750 mg Intravenous Q48H  . warfarin  0.5 mg Oral ONCE-1800  . Warfarin - Pharmacist Dosing Inpatient   Does not apply q1800    SUBJECTIVE:  2/20 - Afebrile, Increased hypoxia overnight, Cough persists 2/22 - "I.m miserable" still coughing, congested, hard to expectorate the sput  VITAL SIGNS: Temp:  [98.2 F (36.8 C)-98.7 F (37.1 C)] 98.2 F (36.8 C) (02/22 0556) Pulse Rate:  [78-83] 80 (02/22 0556) Resp:  [16-18] 17 (02/22 0556) BP: (113-116)/(39-44) 116/43 mmHg (02/22 0556) SpO2:  [86 %-100 %] 86 % (02/22 0821) Weight:  [50.8 kg (111 lb 15.9 oz)] 50.8 kg (111 lb 15.9 oz) (02/22 0556)  PHYSICAL EXAMINATION: Gen: Pleasant, weak, in no distress, kyphotic  ENT: No lesions, mouth clear, oropharynx clear, no postnasal drip  Neck: No JVD, no TMG, no carotid bruits  Lungs: No use of accessory muscles, scat rhonchi bilat R>L, hoarse voice Cardiovascular: RRR, heart sounds normal, no murmur or gallops, no peripheral edema  Musculoskeletal: No deformities, no cyanosis or clubbing  Neuro: alert, non focal, poor memory Skin: Warm, no lesions or rashes   Recent Labs Lab 03/11/13 0440 03/12/13 0330 03/13/13 0445  NA 122* 123* 123*  K 3.8 3.8 3.7  CL 83* 82* 82*  CO2 25 27 29   BUN 48* 43* 42*  CREATININE 2.67* 2.43* 2.22*  GLUCOSE 101* 100* 101*    Recent Labs Lab 03/09/13 2215 03/10/13 0630 03/13/13 0445  HGB 9.5* 8.3* 8.6*  HCT 27.8* 24.4* 24.1*  WBC 16.0* 13.0* 12.3*  PLT 369 313 277    IMAGING:  Port CXR 2/20> IMPRESSION:  Stable moderate right effusion and right lower lobe airspace disease.    ASSESSMENT / PLAN:  RLL pneumonia & Effusion (new this adm) Bronchiectasis, without acute bronchospasm  Sputum +MRSA  PAH - PASP 37 01/2010 Hx PE Plan: -renal dose vancomycin q 48h per pharm -NEBS w/ [id number] -DNR / DNI -Pharmacy consult for coumadin mgmt -add Mucinex -f/u CXR in AM 2/23   ESRD (end stage renal disease)  - baseline sr cr 2.5 AVF  - not on HD Hyponatremia - serum Na= 123, urine na 18 , favor diuretic induced Plan: -trend BMP for BUN / Sr Cr -continue calcitriol -dietary supplement -renal following, IVFs stopped & lasix resumed=> now 80 po bid  Chronic Diastolic CHF PAF HTN Plan: -continue home norvasc, metoprolol -on oral Lasix now per Nephrology -coumadin as above  Hypothyroidism  Plan: -continue synthroid  Overactive Bladder Plan: -continue ditropan  Depression  Plan: -continue zoloft  Coagulopathy -chronic coumadin administration Plan: -coumadin per pharmacy    NADEL,SCOTT M 03/13/2013, 11:25 AM

## 2013-03-13 NOTE — Progress Notes (Signed)
Pt expressed concern about being wakened thru out the night for txs.  RT assessment score of 8 gives option of qid.  Rt changed frequency, but pt has q2hprn order if needed.

## 2013-03-13 NOTE — Progress Notes (Signed)
Assessment/Plan:  1. Acute renal failure, improved; chronic kidney disease stage IV at baseline 2. Acute Hyponatremia, hypervolemic (not from diuretics), superimposed upon Chronic hyponatremia 3. Acute bronchiectasis flare/MRSA sputum positive: On antibiotic therapy with vancomycin   Plan:  Continue Furosemide, change to PO, cont fluid restriction  Subjective: Interval History: leaking from foley  Objective: Vital signs in last 24 hours: Temp:  [98.2 F (36.8 C)-98.7 F (37.1 C)] 98.2 F (36.8 C) (02/22 0556) Pulse Rate:  [78-83] 80 (02/22 0556) Resp:  [16-18] 17 (02/22 0556) BP: (113-116)/(39-44) 116/43 mmHg (02/22 0556) SpO2:  [86 %-100 %] 86 % (02/22 0821) Weight:  [50.8 kg (111 lb 15.9 oz)] 50.8 kg (111 lb 15.9 oz) (02/22 0556) Weight change: -2.1 kg (-4 lb 10.1 oz)  Intake/Output from previous day: 02/21 0701 - 02/22 0700 In: 870 [P.O.:720; IV Piggyback:150] Out: 1700 [Urine:1700] Intake/Output this shift: Total I/O In: 120 [P.O.:120] Out: -   General appearance: alert and cooperative Extremities: extremities normal, atraumatic, no cyanosis or edema  Lab Results:  Recent Labs  03/13/13 0445  WBC 12.3*  HGB 8.6*  HCT 24.1*  PLT 277   BMET:  Recent Labs  03/12/13 0330 03/13/13 0445  NA 123* 123*  K 3.8 3.7  CL 82* 82*  CO2 27 29  GLUCOSE 100* 101*  BUN 43* 42*  CREATININE 2.43* 2.22*  CALCIUM 9.2 9.1   No results found for this basename: PTH,  in the last 72 hours Iron Studies:  Recent Labs  03/10/13 1405  IRON 42  TIBC 106*  FERRITIN 3092*   Studies/Results: No results found.  Scheduled: . albuterol  2.5 mg Nebulization Q4H  . amLODipine  5 mg Oral Daily  . calcitRIOL  0.25 mcg Oral Daily  . darbepoetin (ARANESP) injection - NON-DIALYSIS  40 mcg Subcutaneous Q Thu-1800  . feeding supplement (ENSURE COMPLETE)  237 mL Oral Q1400  . feeding supplement (ENSURE)  1 Container Oral TID BM  . furosemide  60 mg Intravenous BID  . guaiFENesin   1,200 mg Oral BID  . levothyroxine  75 mcg Oral QAC breakfast  . metoprolol tartrate  100 mg Oral BID  . multivitamin with minerals  1 tablet Oral Daily  . nystatin  5 mL Oral TID  . oxybutynin  10 mg Oral QHS  . pantoprazole  40 mg Oral Daily  . sertraline  25 mg Oral Daily  . sodium chloride  10-40 mL Intracatheter Q12H  . vancomycin  750 mg Intravenous Q48H  . Warfarin - Pharmacist Dosing Inpatient   Does not apply q1800     LOS: 4 days   Keerthi Hazell C 03/13/2013,9:37 AM

## 2013-03-14 ENCOUNTER — Inpatient Hospital Stay (HOSPITAL_COMMUNITY): Payer: Medicare Other

## 2013-03-14 LAB — RENAL FUNCTION PANEL
ALBUMIN: 1.9 g/dL — AB (ref 3.5–5.2)
BUN: 39 mg/dL — AB (ref 6–23)
CO2: 29 meq/L (ref 19–32)
CREATININE: 1.94 mg/dL — AB (ref 0.50–1.10)
Calcium: 9.4 mg/dL (ref 8.4–10.5)
Chloride: 83 mEq/L — ABNORMAL LOW (ref 96–112)
GFR calc Af Amer: 26 mL/min — ABNORMAL LOW (ref >90)
GFR calc non Af Amer: 22 mL/min — ABNORMAL LOW (ref >90)
Glucose, Bld: 94 mg/dL (ref 70–99)
Phosphorus: 5 mg/dL — ABNORMAL HIGH (ref 2.3–4.6)
Potassium: 3.2 mEq/L — ABNORMAL LOW (ref 3.7–5.3)
Sodium: 124 mEq/L — ABNORMAL LOW (ref 137–147)

## 2013-03-14 LAB — MAGNESIUM: Magnesium: 1.7 mg/dL (ref 1.5–2.5)

## 2013-03-14 LAB — PROTIME-INR
INR: 2.59 — AB (ref 0.00–1.49)
PROTHROMBIN TIME: 26.9 s — AB (ref 11.6–15.2)

## 2013-03-14 LAB — VANCOMYCIN, TROUGH: VANCOMYCIN TR: 17.8 ug/mL (ref 10.0–20.0)

## 2013-03-14 MED ORDER — WARFARIN SODIUM 1 MG PO TABS
1.5000 mg | ORAL_TABLET | Freq: Once | ORAL | Status: AC
  Administered 2013-03-14: 1.5 mg via ORAL
  Filled 2013-03-14: qty 1

## 2013-03-14 MED ORDER — POTASSIUM CHLORIDE CRYS ER 20 MEQ PO TBCR
40.0000 meq | EXTENDED_RELEASE_TABLET | ORAL | Status: AC
  Administered 2013-03-14: 40 meq via ORAL
  Filled 2013-03-14: qty 2

## 2013-03-14 MED ORDER — FUROSEMIDE 80 MG PO TABS
80.0000 mg | ORAL_TABLET | Freq: Every day | ORAL | Status: DC
  Administered 2013-03-15: 80 mg via ORAL
  Filled 2013-03-14 (×2): qty 1

## 2013-03-14 MED ORDER — METOPROLOL TARTRATE 50 MG PO TABS
50.0000 mg | ORAL_TABLET | Freq: Two times a day (BID) | ORAL | Status: DC
  Administered 2013-03-14 – 2013-03-19 (×10): 50 mg via ORAL
  Filled 2013-03-14 (×11): qty 1

## 2013-03-14 NOTE — Progress Notes (Signed)
ANTICOAGULATION CONSULT NOTE - Follow Up Consult  Pharmacy Consult for warfarin Indication: atrial fibrillation  Allergies  Allergen Reactions  . Codeine Other (See Comments)    unknown  . Sulfa Antibiotics     unknown   Patient Measurements: Height: 5\' 2"  (157.5 cm) Weight: 112 lb 10.5 oz (51.1 kg) IBW/kg (Calculated) : 50.1  Vital Signs: Temp: 97.4 F (36.3 C) (02/23 0445) Temp src: Oral (02/23 0445) BP: 119/45 mmHg (02/23 0445) Pulse Rate: 67 (02/23 0445)  Labs:  Recent Labs  03/12/13 0330 03/13/13 0445 03/14/13 0345  HGB  --  8.6*  --   HCT  --  24.1*  --   PLT  --  277  --   LABPROT 30.2* 29.3* 26.9*  INR 3.02* 2.90* 2.59*  CREATININE 2.43* 2.22* 1.94*   Assessment: 88 YOF  P[facility] [place] [facility] 2[facility] with MRSA PNA, orders for pharmacy to continue warfarin therapy for h/o afib. Admit INR 2.96, no Warfarin given day of admit. Home dose reported as 3mg  daily, last dose unknown. Started on Aranesp (on Epo prior to admission).  Today's INR = 2.59, trending down now following low warfarin doses.  Warfarin from 2/20: 1.5, 0.5, 0.5mg   2/22: Hgb = 8.6 (down from admission value), Platelets WNL  No significant drug interactions noted  PO intake low  Goal of Therapy:  INR 2-3 Monitor platelets by anticoagulation protocol: Yes   Plan:   Warfarin 1.5 mg po once today.  Daily INR  Clance BollAmanda Mckell Riecke, PharmD, BCPS Pager: [phone] 03/14/2013 8:51 AM

## 2013-03-14 NOTE — Plan of Care (Signed)
Problem: Phase II Progression Outcomes Goal: Progress activity as tolerated unless otherwise ordered Outcome: Progressing Patient got up to bedside commode twice last night. 1 assist, patient steady

## 2013-03-14 NOTE — Progress Notes (Signed)
Physical Therapy Treatment Patient Details Name: Cheryl [name] MRN: [id number] DOB: 09/07/1925 Today's Date: 03/14/2013 Time: [id number] PT Time Calculation (min): 23 min  PT Assessment / Plan / Recommendation  History of Present Illness 80 yo female admitted with acute renal failure, SOB, bronchiectesis with bronchitis, pna. Hx of HTN, P AFIB, PE, pulm HTN, L LE IM nail   PT Comments   Progressing slowly with mobility. Mod encouragement for pt to progress activity/ambulate. Appears to be very deconditioned. Will need to use walker for ambulation.   Follow Up Recommendations  Home health PT;Supervision/Assistance - 24 hour     Does the patient have the potential to tolerate intense rehabilitation     Barriers to Discharge        Equipment Recommendations  None recommended by PT (pt/daughter state pt already has walker)    Recommendations for Other Services    Frequency Min 3X/week   Progress towards PT Goals Progress towards PT goals: Progressing toward goals (slowly)  Plan Current plan remains appropriate    Precautions / Restrictions Precautions Precautions: Fall Restrictions Weight Bearing Restrictions: No   Pertinent Vitals/Pain No c/o pain    Mobility  Bed Mobility General bed mobility comments: pt sitting in recliner Transfers Overall transfer level: Needs assistance Transfers: Sit to/from Stand Sit to Stand: Min assist Stand pivot transfers: Min assist General transfer comment: Assist to rise, stabilize, control descent. Increased time. Ambulation/Gait Ambulation/Gait assistance: Min assist Ambulation Distance (Feet): 40 Feet Assistive device: Rolling walker (2 wheeled) General Gait Details: slow gait speed. very unsteady. fatigues easily. vcs safety, distance from walker, posture. O2 sats 90% on RA during ambulation    Exercises General Exercises - Lower Extremity Long Arc Quad: AROM;Both;10 reps;Seated   PT Diagnosis:    PT Problem List:   PT  Treatment Interventions:     PT Goals (current goals can now be found in the care plan section)    Visit Information  Last PT Received On: 03/14/13 Assistance Needed: +1 History of Present Illness: 80 yo female admitted with acute renal failure, SOB, bronchiectesis with bronchitis, pna. Hx of HTN, P AFIB, PE, pulm HTN, L LE IM nail    Subjective Data      Cognition  Cognition Arousal/Alertness: Lethargic Behavior During Therapy: WFL for tasks assessed/performed Overall Cognitive Status: Within Functional Limits for tasks assessed    Balance     End of Session PT - End of Session Equipment Utilized During Treatment: Gait belt Activity Tolerance: Patient limited by fatigue Patient left: in chair;with call bell/phone within reach;with family/visitor present   GP     Cheryl [name], MPT Pager: [phone]

## 2013-03-14 NOTE — Progress Notes (Signed)
ANTIBIOTIC CONSULT NOTE - FOLLOW UP  Pharmacy Consult for Vancomycin Indication: MRSA PNA   Allergies  Allergen Reactions  . Codeine Other (See Comments)    unknown  . Sulfa Antibiotics     unknown    Patient Measurements: Height: 5\' 2"  (157.5 cm) Weight: 914112 lb 10.5 oz (51.1 kg) IBW/kg (Calculated) : 50.1 Adjusted Body Weight:   Vital Signs: Temp: 98.7 F (37.1 C) (02/23 2230) Temp src: Oral (02/23 2230) BP: 113/48 mmHg (02/23 2230) Pulse Rate: 73 (02/23 2230) Intake/Output from previous day: 02/22 0701 - 02/23 0700 In: 540 [P.O.:540] Out: 2151 [Urine:2150; Stool:1] Intake/Output from this shift: Total I/O In: -  Out: 450 [Urine:450]  Labs:  Recent Labs  03/12/13 0330 03/13/13 0445 03/14/13 0345  WBC  --  12.3*  --   HGB  --  8.6*  --   PLT  --  277  --   CREATININE 2.43* 2.22* 1.94*   Estimated Creatinine Clearance: 16.5 ml/min (by C-G formula based on Cr of 1.94).  Recent Labs  03/12/13 2110 03/14/13 2135  VANCOTROUGH 14.4 17.8     Microbiology: Recent Results (from the past 720 hour(s))  RESPIRATORY CULTURE OR RESPIRATORY AND SPUTUM CULTURE     Status: None   Collection Time    02/25/13  4:20 PM      Result Value Ref Range Status   Culture     Final   Value: Abundant METHICILLIN RESISTANT STAPHYLOCOCCUS AUREUS   Gram Stain Moderate   Final   Gram Stain WBC present-predominately PMN   Final   Gram Stain Rare Squamous Epithelial Cells Present   Final   Gram Stain Moderate Yeast   Final   Gram Stain     Final   Value: Abundant GRAM POSITIVE COCCI IN PAIRS In Clusters In Chains   Gram Stain Few Gram Positive Rods   Final   Organism ID, Bacteria METHICILLIN RESISTANT STAPHYLOCOCCUS AUREUS   Final   Comment: Rifampin and Gentamicin should not be used as     single drugs for treatment of Staph infections.  AFB CULTURE WITH SMEAR     Status: None   Collection Time    02/25/13  4:20 PM      Result Value Ref Range Status   Preliminary Report  Culture will be examined for 6 weeks before    Preliminary   Preliminary Report issuing a Final Report.   Preliminary   ACID FAST SMEAR No Acid Fast Bacilli Seen   Preliminary    Anti-infectives   Start     Dose/Rate Route Frequency Ordered Stop   03/12/13 2200  vancomycin (VANCOCIN) IVPB 750 mg/150 ml premix     750 mg 150 mL/hr over 60 Minutes Intravenous Every 48 hours 03/11/13 0926     03/10/13 2030  vancomycin (VANCOCIN) IVPB 750 mg/150 ml premix  Status:  Discontinued     750 mg 150 mL/hr over 60 Minutes Intravenous Daily 03/10/13 1950 03/11/13 0926   03/09/13 1730  vancomycin (VANCOCIN) IVPB 1000 mg/200 mL premix     1,000 mg 200 mL/hr over 60 Minutes Intravenous  Once 03/09/13 1649 03/09/13 2212      Assessment: Patient with level at goal.    Goal of Therapy:  Vancomycin trough level 15-20 mcg/ml  Plan:  Measure antibiotic drug levels at steady state Follow up culture results Continue with current dosing  Aleene DavidsonGrimsley Jr, Vitor Overbaugh Crowford 03/14/2013,11:03 PM

## 2013-03-14 NOTE — Progress Notes (Signed)
  [facility] Progress Note   Subjective: No complaints, Na 124, 2.1 liters UOP yesterday  Filed Vitals:   03/14/13 0445 03/14/13 0857 03/14/13 1437 03/14/13 1636  BP: 119/45  123/52   Pulse: 67  72   Temp: 97.4 F (36.3 C)  97.4 F (36.3 C)   TempSrc: Oral  Oral   Resp: 18  20   Height:      Weight: 51.1 kg (112 lb 10.5 oz)     SpO2: 100% 93% 93% 100%  Exam No distress, elderly, frail , pleasant No jvd Coarse rales r lung 2/3 up, L clear RRR no MRG Abd soft, no ascites No edema of extremities Neuro is grossly nf  Uosm- 323, 298 UNa-, 18, 29 CXR- multifocal R pna, no chf  Assessment: 1 Acute renal failure- resolved 2 Hyponatremia- somewhat better with diuresis 3 MRSA PNA/bronchietasis- on vancomycin 3 CKD baseline creat 1.8-2.5, has AVF in place  Plan- cont lasix at reduced dose 80/d for now, cont fluid restriction. Stop zoloft (may cause hyponatremia). Check labs in am    Cheryl Moselleob Cheryl Haggar MD  pager [phone]    cell [phone]  03/14/2013, 8:35 PM     Recent Labs Lab 03/12/13 0330 03/13/13 0445 03/14/13 0345  NA 123* 123* 124*  K 3.8 3.7 3.2*  CL 82* 82* 83*  CO2 27 29 29   GLUCOSE 100* 101* 94  BUN 43* 42* 39*  CREATININE 2.43* 2.22* 1.94*  CALCIUM 9.2 9.1 9.4  PHOS 5.3* 4.8* 5.0*    Recent Labs Lab 03/12/13 0330 03/13/13 0445 03/14/13 0345  ALBUMIN 1.9* 1.8* 1.9*    Recent Labs Lab 03/09/13 2215 03/10/13 0630 03/13/13 0445  WBC 16.0* 13.0* 12.3*  HGB 9.5* 8.3* 8.6*  HCT 27.8* 24.4* 24.1*  MCV 98.9 98.4 98.0  PLT 369 313 277   . albuterol  2.5 mg Nebulization QID  . calcitRIOL  0.25 mcg Oral Daily  . darbepoetin (ARANESP) injection - NON-DIALYSIS  40 mcg Subcutaneous Q Thu-1800  . feeding supplement (ENSURE COMPLETE)  237 mL Oral Q1400  . feeding supplement (ENSURE)  1 Container Oral TID BM  . [START ON 03/15/2013] furosemide  80 mg Oral Daily  . levothyroxine  75 mcg Oral QAC breakfast  . metoprolol tartrate  50 mg Oral  BID  . multivitamin with minerals  1 tablet Oral Daily  . nystatin  5 mL Oral TID  . oxybutynin  10 mg Oral QHS  . pantoprazole  40 mg Oral Daily  . sertraline  25 mg Oral Daily  . sodium chloride  10-40 mL Intracatheter Q12H  . vancomycin  750 mg Intravenous Q48H  . Warfarin - Pharmacist Dosing Inpatient   Does not apply q1800     acetaminophen, albuterol, lactulose, sodium chloride, traZODone

## 2013-03-14 NOTE — Progress Notes (Signed)
PULMONARY PROGRESS NOTE  Name: Cheryl [name] MRN: [id number] DOB: 11/05/1925    ADMISSION DATE:  03/09/2013  PRIMARY SERVICE:  PCCM  CHIEF COMPLAINT:  SOB, MRSA PNA  BRIEF PATIENT DESCRIPTION: 80 y/o F direct [facility] with productive cough, LE Swelling, & sputum positive for MRSA.    SIGNIFICANT EVENTS / STUDIES:  09/14/12 - [facility], complete occlusion of RML bronchus.  CT concerning for endobronchial lesion 09/21/12 - FOB: non-diagnostic> single focus abnormal glandular tissue, unable to definitively call malignancy 02/26/12 - [facility] for acute bronchiectasis flare. Sputum culture obtained=> MRSA (empirically treated with Cipro) .................................................................................................................................................................................. 03/09/12 - seen in office by RB - c/o increased LE swelling, cough with dark sputum production.  Sputum noted to be positive for MRSA.  Hx of enterobacter in past (none this culture). Rx called for Zyvox but pt unable to afford (>1500$).  [facility].     CULTURES: Sputum 2/6>>>abundant MRSA  ANTIBIOTICS: Vancomycin (MRSA) 2/18>>>  SUBJECTIVE:  2/20 - Afebrile, Increased hypoxia overnight, Cough persists 2/22 - "I.m miserable" still coughing, congested, hard to expectorate the sput 2/23 o improvement. Wants soup. VITAL SIGNS: Temp:  [97.4 F (36.3 C)-98.6 F (37 C)] 97.4 F (36.3 C) (02/23 0445) Pulse Rate:  [67-80] 67 (02/23 0445) Resp:  [16-18] 18 (02/23 0445) BP: (117-138)/(43-46) 119/45 mmHg (02/23 0445) SpO2:  [93 %-100 %] 93 % (02/23 0857) Weight:  [51.1 kg (112 lb 10.5 oz)] 51.1 kg (112 lb 10.5 oz) (02/23 0445)  PHYSICAL EXAMINATION: Gen: Pleasant, weak, in no distress, kyphotic  ENT: No lesions, mouth clear, Neck: No JVD, Lungs: No use of accessory muscles, scat rhonchi bilat R>L, hoarse  voice Cardiovascular: RRR, heart sounds normal, no murmur or gallops, no peripheral edema  Musculoskeletal: No deformities, no cyanosis or clubbing  Neuro: alert, non focal, poor memory Skin: Warm, no lesions or rashes  Labs: Reviewed K+ 3.2  IMAGING:  Port CXR 2/23> IMPRESSION:  1. Mildly improved ventilation. Decreased appearance of bilateral  pleural effusions.  2. Multifocal confluent right lung opacity most suggestive of  multilobar pneumonia.     ASSESSMENT / PLAN:  RLL pneumonia & Effusion (new this adm) Bronchiectasis, without acute bronchospasm  Sputum +MRSA  PAH - PASP 37 01/2010 Hx PE Plan: -renal dose vancomycin q 48h per pharm -NEBS w/ [id number] -DNR / DNI -Pharmacy consult for coumadin mgmt -add Mucinex -f/u CXR in AM 2/23   ESRD (end stage renal disease)  - baseline sr cr 2.5 AVF - not on HD Hyponatremia - serum Na= 123, urine na 18 , favor diuretic induced Plan: -trend BMP for BUN / Sr Cr -continue calcitriol -dietary supplement -renal following, IVFs stopped & lasix resumed=> now 80 po bid  Chronic Diastolic CHF PAF HTN Plan: -continue home norvasc, metoprolol -on oral Lasix now per Nephrology -coumadin as above  Hypothyroidism  Plan: -continue synthroid  Overactive Bladder Plan: -continue ditropan  Depression  Plan: -continue zoloft  Coagulopathy -chronic coumadin administration Plan: -coumadin per pharmacy    Brett CanalesSteve Minor ACNP [facility] Pager [phone] till 3 pm If no answer page [phone] 03/14/2013, 12:05 PM  PCCM ATTENDING: I have interviewed and examined the patient and reviewed the database. I have formulated the assessment and plan as reflected in the note above with amendments made by me.   Billy Fischeravid Simonds, MD;  PCCM service; Mobile [phone]

## 2013-03-15 ENCOUNTER — Ambulatory Visit: Payer: Medicare Other | Admitting: Emergency Medicine

## 2013-03-15 LAB — SODIUM
SODIUM: 122 meq/L — AB (ref 137–147)
Sodium: 124 mEq/L — ABNORMAL LOW (ref 137–147)

## 2013-03-15 LAB — RENAL FUNCTION PANEL
ALBUMIN: 1.9 g/dL — AB (ref 3.5–5.2)
BUN: 38 mg/dL — AB (ref 6–23)
CHLORIDE: 80 meq/L — AB (ref 96–112)
CO2: 29 mEq/L (ref 19–32)
Calcium: 9.7 mg/dL (ref 8.4–10.5)
Creatinine, Ser: 1.82 mg/dL — ABNORMAL HIGH (ref 0.50–1.10)
GFR calc non Af Amer: 24 mL/min — ABNORMAL LOW (ref >90)
GFR, EST AFRICAN AMERICAN: 28 mL/min — AB (ref >90)
Glucose, Bld: 105 mg/dL — ABNORMAL HIGH (ref 70–99)
PHOSPHORUS: 4.4 mg/dL (ref 2.3–4.6)
POTASSIUM: 4.1 meq/L (ref 3.7–5.3)
SODIUM: 119 meq/L — AB (ref 137–147)

## 2013-03-15 LAB — PROTIME-INR
INR: 2.36 — ABNORMAL HIGH (ref 0.00–1.49)
PROTHROMBIN TIME: 25 s — AB (ref 11.6–15.2)

## 2013-03-15 LAB — MAGNESIUM: Magnesium: 1.8 mg/dL (ref 1.5–2.5)

## 2013-03-15 MED ORDER — TOLVAPTAN 15 MG PO TABS
15.0000 mg | ORAL_TABLET | Freq: Every day | ORAL | Status: DC
  Filled 2013-03-15: qty 1

## 2013-03-15 MED ORDER — WARFARIN SODIUM 1 MG PO TABS
1.5000 mg | ORAL_TABLET | Freq: Once | ORAL | Status: AC
  Administered 2013-03-15: 1.5 mg via ORAL
  Filled 2013-03-15: qty 1

## 2013-03-15 NOTE — Progress Notes (Signed)
ANTICOAGULATION CONSULT NOTE - Follow Up Consult  Pharmacy Consult for warfarin Indication: atrial fibrillation  Allergies  Allergen Reactions  . Codeine Other (See Comments)    unknown  . Sulfa Antibiotics     unknown   Patient Measurements: Height: 5\' 2"  (157.5 cm) Weight: 112 lb 10.5 oz (51.1 kg) IBW/kg (Calculated) : 50.1  Vital Signs: Temp: 97.7 F (36.5 C) (02/24 0710) Temp src: Oral (02/24 0710) BP: 135/51 mmHg (02/24 0710) Pulse Rate: 76 (02/24 0710)  Labs:  Recent Labs  03/13/13 0445 03/14/13 0345 03/15/13 0315  HGB 8.6*  --   --   HCT 24.1*  --   --   PLT 277  --   --   LABPROT 29.3* 26.9* 25.0*  INR 2.90* 2.59* 2.36*  CREATININE 2.22* 1.94* 1.82*   Assessment: 88 YOF  [facility] MRSA PNA, orders for pharmacy to continue warfarin therapy for h/o afib. Admit INR 2.96, no Warfarin given day of admit. Home dose reported as 3mg  daily, last dose unknown. Started on Aranesp (on Epo prior to admission).  Today's INR = 2.36, trending down now following low warfarin doses.  Warfarin from 2/20: 1.5, 0.5, 0.5, 1.5mg   2/22: Hgb = 8.6 (down from admission value), Platelets WNL  No significant drug interactions noted  PO intake low  Goal of Therapy:  INR 2-3 Monitor platelets by anticoagulation protocol: Yes   Plan:   Warfarin 1.5 mg po once today.  Still providing lower dose due to low PO intake and INR hasn't had a chance to respond to 1.5 mg dose given yesterday.  Daily INR  Clance BollAmanda Sierah Lacewell, PharmD, BCPS Pager: [phone] 03/15/2013 1:24 PM

## 2013-03-15 NOTE — Progress Notes (Signed)
PULMONARY PROGRESS NOTE  Name: Cheryl [name] MRN: [id number] DOB: 03/03/1925    ADMISSION DATE:  03/09/2013  PRIMARY SERVICE:  PCCM  CHIEF COMPLAINT:  SOB, MRSA PNA  BRIEF PATIENT DESCRIPTION: 80 y/o F direct [facility] with productive cough, LE Swelling, & sputum positive for MRSA.    SIGNIFICANT EVENTS / STUDIES:  [facility]14/12 [facility], complete occlusion of RML bronchus.  CT concerning for endobronchial lesion 09/21/12 - FOB: non-diagnostic> single focus abnormal glandular tissue, unable to definitively call malignancy 02/26/12 - [facility]. Sputum culture obtained=> MRSA (empirically treated with Cipro) .................................................................................................................................................................................. 03/09/12 - seen in office by RB - c/o increased LE swelling, cough with dark sputum production.  Sputum noted to be positive for MRSA.  Hx of enterobacter in past (none this culture). Rx called for Zyvox but pt unable to afford (>1500$).  [facility].     CULTURES: Sputum 2/6>>>abundant MRSA  ANTIBIOTICS: Vancomycin (MRSA) 2/18>>>  SUBJECTIVE:  2/20 - Afebrile, Increased hypoxia overnight, Cough persists 2/22 - "I.m miserable" still coughing, congested, hard to expectorate the sput 2/23 No improvement. Wants soup. VITAL SIGNS: Temp:  [97.4 F (36.3 C)-98.7 F (37.1 C)] 97.7 F (36.5 C) (02/24 0710) Pulse Rate:  [72-76] 76 (02/24 0710) Resp:  [16-24] 24 (02/24 0710) BP: (113-135)/(48-52) 135/51 mmHg (02/24 0710) SpO2:  [88 %-100 %] 88 % (02/24 0741)  PHYSICAL EXAMINATION: Gen: Pleasant, weak, in no distress, kyphotic  ENT: No lesions, mouth clear, Neck: No JVD, Lungs: No use of accessory muscles, scat rhonchi bilat R>L, hoarse voice Cardiovascular: RRR, heart sounds normal, no murmur or gallops, no peripheral edema   Musculoskeletal: No deformities, no cyanosis or clubbing  Neuro: alert, non focal, poor memory Skin: Warm, no lesions or rashes  Labs: Reviewed K+ 4.1, na 119  IMAGING:  Port CXR 2/23> IMPRESSION:  1. Mildly improved ventilation. Decreased appearance of bilateral  pleural effusions.  2. Multifocal confluent right lung opacity most suggestive of  multilobar pneumonia.     ASSESSMENT / PLAN:  RLL pneumonia & Effusion (new this adm) Bronchiectasis, without acute bronchospasm  Sputum +MRSA  PAH - PASP 37 01/2010 Hx PE Plan: -renal dose vancomycin q 48h per pharm -NEBS w/ [id number] -DNR / DNI -Pharmacy consult for coumadin mgmt -add Mucinex -f/u CXR as needed -wean O2 a tolerated, may need home O2   ESRD (end stage renal disease)  - baseline sr cr 2.5 AVF - not on HD Hyponatremia - serum Na= 123->119, urine na 18 , favor diuretic induced Plan: -trend BMP for BUN / Sr Cr -continue calcitriol -dietary supplement -renal following, IVFs stopped & lasix resumed  Chronic Diastolic CHF PAF HTN Plan: -continue home norvasc, metoprolol -on oral Lasix now per Nephrology -coumadin as above  Hypothyroidism  Plan: -continue synthroid  Overactive Bladder Plan: -continue ditropan  Depression  Plan: -continue zoloft  Coagulopathy -chronic coumadin administration Plan: -coumadin per pharmacy    Brett CanalesSteve Minor ACNP [facility] Pager [phone] till 3 pm If no answer page [phone] 03/15/2013, 10:23 AM  PCCM ATTENDING: I have interviewed and examined the patient and reviewed the database. I have formulated the assessment and plan as reflected in the note above with amendments made by me.   Billy Fischeravid Trenita Hulme, MD;  PCCM service; Mobile [phone]

## 2013-03-15 NOTE — Progress Notes (Signed)
CRITICAL VALUE ALERT  Critical value received:  Na 119  Date of notification:  03/15/2013   Time of notification:  4 AM  Critical value read back:yes  Nurse who received alert:  Silvestre GunnerAmanda Doreene Forrey RN  MD notified (1st page):  E-Link  Time of first page:  4:05 AM  MD notified (2nd page):  Time of second page:  Responding MD:  E-link  Time MD responded:  4:05 AM

## 2013-03-15 NOTE — Progress Notes (Signed)
Physical Therapy Treatment Patient Details Name: Cheryl [name] MRN: [id number] DOB: 03/12/1925 Today's Date: 03/15/2013 Time: [id number] PT Time Calculation (min): 25 min  PT Assessment / Plan / Recommendation  History of Present Illness 80 yo female admitted with acute renal failure, SOB, bronchiectesis with bronchitis, pna. Hx of HTN, P AFIB, PE, pulm HTN, L LE IM nail   PT Comments   Progressing with mobility. Tolerated activity well this session.   Follow Up Recommendations  Home health PT;Supervision/Assistance - 24 hour     Does the patient have the potential to tolerate intense rehabilitation     Barriers to Discharge        Equipment Recommendations  None recommended by PT    Recommendations for Other Services    Frequency Min 3X/week   Progress towards PT Goals Progress towards PT goals: Progressing toward goals  Plan Current plan remains appropriate    Precautions / Restrictions Precautions Precautions: Fall Restrictions Weight Bearing Restrictions: No   Pertinent Vitals/Pain No c/o pain    Mobility  Bed Mobility Overal bed mobility: Needs Assistance Supine to sit: Min guard;HOB elevated Transfers Overall transfer level: Needs assistance Equipment used: Rolling walker (2 wheeled) Sit to Stand: Min guard Stand pivot transfers: Min guard General transfer comment: close guard for safety. vcs safety, hand placement Ambulation/Gait Ambulation/Gait assistance: Min assist Ambulation Distance (Feet): 115 Feet Assistive device: Rolling walker (2 wheeled) Gait Pattern/deviations: Trunk flexed;Decreased stride length;Step-through pattern General Gait Details: slow gait speed. fatigues failry easily. vcs safety, distance from walker, posture. O2 sats 87-90% on RA during ambulation    Exercises     PT Diagnosis:    PT Problem List:   PT Treatment Interventions:     PT Goals (current goals can now be found in the care plan section)    Visit Information  Last PT Received On: 03/15/13 Assistance Needed: +1 History of Present Illness: 80 yo female admitted with acute renal failure, SOB, bronchiectesis with bronchitis, pna. Hx of HTN, P AFIB, PE, pulm HTN, L LE IM nail    Subjective Data      Cognition  Cognition Arousal/Alertness: Awake/alert Behavior During Therapy: WFL for tasks assessed/performed Overall Cognitive Status: Within Functional Limits for tasks assessed    Balance     End of Session PT - End of Session Equipment Utilized During Treatment: Gait belt Activity Tolerance: Patient tolerated treatment well Patient left: in chair;with call bell/phone within reach   GP     Cheryl [name], MPT Pager: [phone]

## 2013-03-15 NOTE — Progress Notes (Signed)
  [facility] Progress Note   Subjective: Na down 119, up in chair, no complaints  Filed Vitals:   03/14/13 2230 03/15/13 0710 03/15/13 0741 03/15/13 1346  BP: 113/48 135/51  116/34  Pulse: 73 76  84  Temp: 98.7 F (37.1 C) 97.7 F (36.5 C)  97.8 F (36.6 C)  TempSrc: Oral Oral  Oral  Resp: 16 24  22   Height:      Weight:      SpO2: 93%  88% 100%  Exam No distress, elderly, frail , pleasant No jvd Coarse rales R lung 1/3 up, L clear RRR no MRG Abd soft, no ascites No edema of extremities Neuro is grossly nf, gen weakness, Ox3, HOH  Uosm- 323, 298 UNa-, 18, 29 CXR- multifocal R pna, no chf  Assessment: 1 Acute renal failure- resolved 2 Hyponatremia- worse again, will try tolvaptan 3 MRSA PNA/bronchietasis- on vancomycin 4 CKD baseline creat 1.8-2.5 5 Anemia, on darbe 40/wk  Plan- hold lasix, give tolvaptan. Also pt is no longer a good candidate for dialysis with FTT after hip fracture last year, have spoken with daughter who agrees.  Will ask palliative care to meet with family about EOL issues.      Vinson Moselleob Barba Solt MD  pager [phone]    cell [phone]  03/15/2013, 4:16 PM     Recent Labs Lab 03/13/13 0445 03/14/13 0345 03/15/13 0315  NA 123* 124* 119*  K 3.7 3.2* 4.1  CL 82* 83* 80*  CO2 29 29 29   GLUCOSE 101* 94 105*  BUN 42* 39* 38*  CREATININE 2.22* 1.94* 1.82*  CALCIUM 9.1 9.4 9.7  PHOS 4.8* 5.0* 4.4    Recent Labs Lab 03/13/13 0445 03/14/13 0345 03/15/13 0315  ALBUMIN 1.8* 1.9* 1.9*    Recent Labs Lab 03/09/13 2215 03/10/13 0630 03/13/13 0445  WBC 16.0* 13.0* 12.3*  HGB 9.5* 8.3* 8.6*  HCT 27.8* 24.4* 24.1*  MCV 98.9 98.4 98.0  PLT 369 313 277   . albuterol  2.5 mg Nebulization QID  . calcitRIOL  0.25 mcg Oral Daily  . darbepoetin (ARANESP) injection - NON-DIALYSIS  40 mcg Subcutaneous Q Thu-1800  . feeding supplement (ENSURE COMPLETE)  237 mL Oral Q1400  . feeding supplement (ENSURE)  1 Container Oral TID BM  .  furosemide  80 mg Oral Daily  . levothyroxine  75 mcg Oral QAC breakfast  . metoprolol tartrate  50 mg Oral BID  . multivitamin with minerals  1 tablet Oral Daily  . nystatin  5 mL Oral TID  . oxybutynin  10 mg Oral QHS  . pantoprazole  40 mg Oral Daily  . sodium chloride  10-40 mL Intracatheter Q12H  . vancomycin  750 mg Intravenous Q48H  . warfarin  1.5 mg Oral ONCE-1800  . Warfarin - Pharmacist Dosing Inpatient   Does not apply q1800     acetaminophen, albuterol, lactulose, sodium chloride, traZODone

## 2013-03-16 DIAGNOSIS — J479 Bronchiectasis, uncomplicated: Secondary | ICD-10-CM

## 2013-03-16 DIAGNOSIS — Z515 Encounter for palliative care: Secondary | ICD-10-CM

## 2013-03-16 LAB — RENAL FUNCTION PANEL
Albumin: 1.9 g/dL — ABNORMAL LOW (ref 3.5–5.2)
BUN: 36 mg/dL — AB (ref 6–23)
CHLORIDE: 82 meq/L — AB (ref 96–112)
CO2: 31 meq/L (ref 19–32)
Calcium: 9.8 mg/dL (ref 8.4–10.5)
Creatinine, Ser: 1.86 mg/dL — ABNORMAL HIGH (ref 0.50–1.10)
GFR calc non Af Amer: 23 mL/min — ABNORMAL LOW (ref >90)
GFR, EST AFRICAN AMERICAN: 27 mL/min — AB (ref >90)
Glucose, Bld: 91 mg/dL (ref 70–99)
PHOSPHORUS: 4.5 mg/dL (ref 2.3–4.6)
POTASSIUM: 3.9 meq/L (ref 3.7–5.3)
SODIUM: 123 meq/L — AB (ref 137–147)

## 2013-03-16 LAB — MAGNESIUM: Magnesium: 1.8 mg/dL (ref 1.5–2.5)

## 2013-03-16 LAB — OSMOLALITY, URINE
OSMOLALITY UR: 278 mosm/kg — AB (ref 390–1090)
Osmolality, Ur: 265 mOsm/kg — ABNORMAL LOW (ref 390–1090)

## 2013-03-16 LAB — SODIUM, URINE, RANDOM
Sodium, Ur: 77 mEq/L
Sodium, Ur: 49 mEq/L

## 2013-03-16 LAB — PROTIME-INR
INR: 1.82 — ABNORMAL HIGH (ref 0.00–1.49)
Prothrombin Time: 20.5 seconds — ABNORMAL HIGH (ref 11.6–15.2)

## 2013-03-16 LAB — SODIUM: Sodium: 122 mEq/L — ABNORMAL LOW (ref 137–147)

## 2013-03-16 MED ORDER — TOLVAPTAN 15 MG PO TABS
15.0000 mg | ORAL_TABLET | ORAL | Status: DC
  Administered 2013-03-16: 15 mg via ORAL
  Filled 2013-03-16: qty 1

## 2013-03-16 MED ORDER — WARFARIN SODIUM 2.5 MG PO TABS
2.5000 mg | ORAL_TABLET | Freq: Once | ORAL | Status: AC
  Administered 2013-03-16: 2.5 mg via ORAL
  Filled 2013-03-16: qty 1

## 2013-03-16 MED ORDER — SODIUM CHLORIDE 1 G PO TABS
2.0000 g | ORAL_TABLET | Freq: Two times a day (BID) | ORAL | Status: DC
  Administered 2013-03-17 – 2013-03-19 (×5): 2 g via ORAL
  Filled 2013-03-16 (×7): qty 2

## 2013-03-16 NOTE — Progress Notes (Signed)
ANTICOAGULATION CONSULT NOTE - Follow Up Consult  Pharmacy Consult for warfarin Indication: atrial fibrillation  Allergies  Allergen Reactions  . Codeine Other (See Comments)    unknown  . Sulfa Antibiotics     unknown   Patient Measurements: Height: 5\' 2"  (157.5 cm) Weight: 112 lb 10.5 oz (51.1 kg) IBW/kg (Calculated) : 50.1  Vital Signs: Temp: 98.4 F (36.9 C) (02/25 0516) Temp src: Oral (02/25 0516) BP: 121/43 mmHg (02/25 0516) Pulse Rate: 77 (02/25 0516)  Labs:  Recent Labs  03/14/13 0345 03/15/13 0315 03/16/13 0350  LABPROT 26.9* 25.0* 20.5*  INR 2.59* 2.36* 1.82*  CREATININE 1.94* 1.82* 1.86*   Assessment: 88 YOF  [facility] with MRSA PNA, orders for pharmacy to continue warfarin therapy for h/o afib. Admit INR 2.96, no Warfarin given day of admit. Home dose reported as 3mg  daily, last dose unknown. Started on Aranesp (on Epo prior to admission).  Today's INR = 1.82, trending down following low warfarin doses.  Warfarin from 2/20: 1.5, 0.5, 0.5, 1.5mg , 1.5mg   2/22: Hgb = 8.6 (down from admission value), Platelets WNL  No significant drug interactions noted  PO intake low  Goal of Therapy:  INR 2-3 Monitor platelets by anticoagulation protocol: Yes   Plan:   Warfarin 2.5mg  po x 1 tonight  Daily INR  Haynes Hoehnolleen Wanita Derenzo, PharmD, BCPS 03/16/2013, 11:25 AM  Pager: [phone]

## 2013-03-16 NOTE — Progress Notes (Signed)
[facility] Progress Note   Subjective: Na improved some after tolvaptan to 124 then dipped down to 123 and 122 at noon today.  UOP 2000 cc yest.  Uosm stuck around 270 again today.  Wt is down to admit weight now.  BP's normal , HR normal.    Filed Vitals:   03/15/13 2219 03/16/13 0516 03/16/13 0916 03/16/13 1348  BP: 126/47 121/43  124/50  Pulse: 79 77  80  Temp: 98.4 F (36.9 C) 98.4 F (36.9 C)  98.6 F (37 C)  TempSrc: Oral Oral  Oral  Resp: _0 Height:      Weight:      SpO2: 97% 91% 96% 100%  Exam No distress, elderly, frail , pleasant No jvd Coarse rales R lung 1/3 up, L clear      RRR no MRG Abd soft, no ascites No edema of extremities Neuro is grossly nf, gen weakness, Ox3, HOH  Uosm- 323, 298 UNa-, 18, 29 CXR- multifocal R pna, no chf  Assessment: 1 Acute renal failure- resolved 2 Hyponatremia- a little better 3 MRSA PNA/bronchietasis- on vancomycin 4 CKD baseline creat 1.8-2.5 5 Anemia, on darbe 40/wk  Plan- family met with palliative care today.  According to daughter they have agreed on d/c home with antibiotics, full DNR, no artificial feeds, no dialysis and no return to the hospital.  If /when pt deteriorates at home they are going to pursue comfort care.  In this light, i don't think we need to try to fully correct Na levels. I will start NaCl tabs 2 bid and would not check Na again.  Also, would not send home on IV vanc given CKD, and in light of plans not to return to hospital, suggest d/c home on oral abx for MRSA and pt will take as tolerated. Have discussed these issues with daughter who is in agreement.  Please call w questions, will sign off.     Kelly Splinter MD  pager [phone]    cell [phone]  03/16/2013, 5:13 PM     Recent Labs Lab 03/14/13 0345 03/15/13 0315  03/15/13 2310 03/16/13 0350 03/16/13 1145  NA 124* 119*  < > 122* 123* 122*  K 3.2* 4.1  --   --  3.9  --   CL 83* 80*  --   --  82*  --   CO2 29 29   --   --  31  --   GLUCOSE 94 105*  --   --  91  --   BUN 39* 38*  --   --  36*  --   CREATININE 1.94* 1.82*  --   --  1.86*  --   CALCIUM 9.4 9.7  --   --  9.8  --   PHOS 5.0* 4.4  --   --  4.5  --   < > = values in this interval not displayed.  Recent Labs Lab 03/14/13 0345 03/15/13 0315 03/16/13 0350  ALBUMIN 1.9* 1.9* 1.9*    Recent Labs Lab 03/09/13 2215 03/10/13 0630 03/13/13 0445  WBC 16.0* 13.0* 12.3*  HGB 9.5* 8.3* 8.6*  HCT 27.8* 24.4* 24.1*  MCV 98.9 98.4 98.0  PLT 369 313 277   . albuterol  2.5 mg Nebulization QID  . calcitRIOL  0.25 mcg Oral Daily  . darbepoetin (ARANESP) injection - NON-DIALYSIS  40 mcg Subcutaneous Q Thu-1800  . feeding supplement (ENSURE COMPLETE)  237 mL Oral Q1400  . feeding  supplement (ENSURE)  1 Container Oral TID BM  . levothyroxine  75 mcg Oral QAC breakfast  . metoprolol tartrate  50 mg Oral BID  . multivitamin with minerals  1 tablet Oral Daily  . nystatin  5 mL Oral TID  . oxybutynin  10 mg Oral QHS  . pantoprazole  40 mg Oral Daily  . sodium chloride  10-40 mL Intracatheter Q12H  . tolvaptan  15 mg Oral Q24H  . vancomycin  750 mg Intravenous Q48H  . warfarin  2.5 mg Oral ONCE-1800  . Warfarin - Pharmacist Dosing Inpatient   Does not apply q1800     acetaminophen, albuterol, lactulose, sodium chloride, traZODone

## 2013-03-16 NOTE — Progress Notes (Signed)
   Thank you for consulting the Palliative Medicine Team at [facility] to meet your patient's and family's needs.   The reason that you asked us to see your patient is  For Clarification of GOC and options  We have scheduled your patient for a meeting: Today at 1 pm  03-16-13 with Yong ChannelAlicia Parker NP   Other family members that need to be present: (2) daughters to  Be present   Your patient is able/unable to participate:  able  Lorinda CreedMary Larach NP  Palliative Medicine Team Team Phone # [phone] Pager [phone]

## 2013-03-16 NOTE — Progress Notes (Signed)
PULMONARY PROGRESS NOTE  Name: Cheryl [name] MRN: [id number] DOB: 10/25/1925    ADMISSION DATE:  03/09/2013  PRIMARY SERVICE:  PCCM  CHIEF COMPLAINT:  SOB, MRSA PNA  BRIEF PATIENT DESCRIPTION: 80 y/o F direct admitted to [facility] on 2/18 with productive cough, LE Swelling, & sputum positive for MRSA.    SIGNIFICANT EVENTS / STUDIES:  09/14/12 - admit to [facility] for PNA, complete occlusion of RML bronchus.  CT concerning for endobronchial lesion 09/21/12 - FOB: non-diagnostic> single focus abnormal glandular tissue, unable to definitively call malignancy 02/26/12 - seen by [facility] for acute bronchiectasis flare. Sputum culture obtained=> MRSA (empirically treated with Cipro) .................................................................................................................................................................................. 03/09/12 - seen in office by RB - c/o increased LE swelling, cough with dark sputum production.  Sputum noted to be positive for MRSA.  Hx of enterobacter in past (none this culture). Rx called for Zyvox but pt unable to afford (>1500$).  Direct admit to [facility] for further work up.     CULTURES: Sputum 2/6>>>abundant MRSA  ANTIBIOTICS: Vancomycin (MRSA) 2/18>>>  SUBJECTIVE:  Denies SOB.  Feels weak.    VITAL SIGNS: Temp:  [97.8 F (36.6 C)-98.4 F (36.9 C)] 98.4 F (36.9 C) (02/25 0516) Pulse Rate:  [77-84] 77 (02/25 0516) Resp:  [20-24] 20 (02/25 0516) BP: (116-126)/(34-47) 121/43 mmHg (02/25 0516) SpO2:  [91 %-100 %] 96 % (02/25 0916)  PHYSICAL EXAMINATION: Gen: Pleasant, weak, in no distress, kyphotic  ENT: No lesions, mouth clear, Neck: No JVD, Lungs:  resps even non labored on RA, o use of accessory muscles, scat rhonchi bilat R>L, hoarse voice Cardiovascular: RRR, heart sounds normal, no murmur or gallops, no peripheral edema  Musculoskeletal: No deformities, no cyanosis or clubbing  Neuro: alert, non focal, poor memory Skin: Warm, no  lesions or rashes   Recent Labs Lab 03/09/13 2215 03/10/13 0630 03/13/13 0445  HGB 9.5* 8.3* 8.6*  HCT 27.8* 24.4* 24.1*  WBC 16.0* 13.0* 12.3*  PLT 369 313 277    Recent Labs Lab 03/12/13 0330 03/13/13 0445 03/14/13 0345 03/15/13 0315 03/15/13 1718 03/15/13 2310 03/16/13 0350  NA 123* 123* 124* 119* 124* 122* 123*  K 3.8 3.7 3.2* 4.1  --   --  3.9  CL 82* 82* 83* 80*  --   --  82*  CO2 27 29 29 29   --   --  31  GLUCOSE 100* 101* 94 105*  --   --  91  BUN 43* 42* 39* 38*  --   --  36*  CREATININE 2.43* 2.22* 1.94* 1.82*  --   --  1.86*  CALCIUM 9.2 9.1 9.4 9.7  --   --  9.8  MG 1.8 1.8 1.7 1.8  --   --  1.8  PHOS 5.3* 4.8* 5.0* 4.4  --   --  4.5     IMAGING:  No new CXR 2/25   ASSESSMENT / PLAN:  RLL pneumonia & Effusion (new this adm) Bronchiectasis, without acute bronchospasm  Sputum +MRSA  PAH - PASP 37 01/2010 Hx PE Plan: -renal dose vancomycin q 48h per pharm -NEBS w/ [id number] -DNR / DNI -coumadin per pharmacy  -Mucinex -f/u CXR as needed -wean O2 a tolerated, may need home O2 -need to determine bax for d/c -- previously sent home on linezolid for MRSA PNA but cost was prohibitive - ?PICC and Vanc   ESRD (end stage renal disease)  - baseline sr cr 2.5 AVF - not on HD Hyponatremia - serum Na= 123->119, urine na  18 , favor diuretic induced Plan: -trend BMP for BUN / Sr Cr -urine Na, osm pending  -continue calcitriol -dietary supplement -renal following, IVFs stopped & lasix resumed - not good candidate for outpt HD - palliative care meeting today   Chronic Diastolic CHF PAF HTN Plan: -continue home metoprolol -on oral Lasix now per Nephrology -coumadin as above  Hypothyroidism  Plan: -continue synthroid  Overactive Bladder Plan: -continue ditropan  Depression  Plan: -continue zoloft  Coagulopathy -chronic coumadin administration Plan: -coumadin per pharmacy    Plan PICC and DC home 2/27 with plan to complete 14-21 days IV  vancomycin. Care manager to see and assist in home health needs. Social work to see and execute out of hospital DNR  Billy Fischeravid Simonds, MD ; [facility] service Mobile [phone].  After 5:30 PM or weekends, call [phone]  Seen with: Danford BadWHITEHEART,KATHRYN, NP 03/16/2013  12:05 PM Pager: [phone] or [phone]  *Care during the described time interval was provided by me and/or other providers on the critical care team. I have reviewed this patient's available data, including medical history, events of note, physical examination and test results as part of my evaluation.

## 2013-03-16 NOTE — Progress Notes (Signed)
Physical Therapy Treatment Patient Details Name: Aileen FassMargel L Nebergall MRN: [id number] DOB: 08/09/1925 Today's Date: 03/16/2013 Time: [id number] PT Time Calculation (min): 24 min  PT Assessment / Plan / Recommendation  History of Present Illness 80 yo female admitted with acute renal failure, SOB, bronchiectesis with bronchitis, pna. Hx of HTN, P AFIB, PE, pulm HTN, L LE IM nail   PT Comments   Assisted pt OOB to amb limited distance with one sitting rest break to complete 80 feet.  Very unsteady gait with poor forward flex posture.   Follow Up Recommendations  Home health PT;Supervision/Assistance - 24 hour  Pt [place]   Does the patient have the potential to tolerate intense rehabilitation     Barriers to Discharge        Equipment Recommendations  None recommended by PT    Recommendations for Other Services    Frequency Min 3X/week   Progress towards PT Goals Progress towards PT goals: Progressing toward goals  Plan      Precautions / Restrictions Precautions Precautions: Fall Restrictions Weight Bearing Restrictions: No   Pertinent Vitals/Pain     Mobility  Bed Mobility Overal bed mobility: Needs Assistance Bed Mobility: Supine to Sit;Sit to Supine Supine to sit: Min guard Sit to supine: Min assist General bed mobility comments: min assist to support B LE up onto bed Transfers Overall transfer level: Needs assistance Equipment used: Rolling walker (2 wheeled) Transfers: Sit to/from Stand Sit to Stand: Min guard;Min assist General transfer comment: close guard for safety. vcs safety, hand placement Ambulation/Gait Ambulation/Gait assistance: Min assist Ambulation Distance (Feet): 80 Feet Assistive device:  (40 feet x 2 one sitting rest break) Gait Pattern/deviations: Trunk flexed;Step-through pattern;Decreased stride length;Narrow base of support Gait velocity: decreased General Gait Details: slow gait speed. fatigues  failry easily. vcs safety, distance from walker.  Limited activity tolerance.     PT Goals (current goals can now be found in the care plan section)    Visit Information  Last PT Received On: 03/16/13 Assistance Needed: +1 History of Present Illness: 80 yo female admitted with acute renal failure, SOB, bronchiectesis with bronchitis, pna. Hx of HTN, P AFIB, PE, pulm HTN, L LE IM nail    Subjective Data      Cognition       Balance     End of Session PT - End of Session Equipment Utilized During Treatment: Gait belt Activity Tolerance: Patient limited by fatigue Patient left: in bed;with call bell/phone within reach;with family/visitor present   Felecia ShellingLori Lyndell Gillyard  PTA [facility]  Acute  Rehab Pager      [phone]

## 2013-03-16 NOTE — Consult Note (Signed)
Patient QQ:IWLNLG L Boulet      DOB: Jun 11, 1925      XQJ:[id number]     Consult Note from the Palliative Medicine Team at [place] Requested by: Dr. Jonnie Finner     PCP: Lillard Anes, MD Reason for Consultation: [place] and options.    Phone Number:[phone]  Assessment of patients Current state: Ms. Lisenby is a 80 yo female admitted with productive cough, BLE edema, and positive sputum for MRSA for IV antibiotic treatment and also has history of enterobacter. I have met with her two daughters, Tandy Gaw [phone] who she lives with, HCPOA), and Jamas Lav [phone]). We talked about Ms. Hartt and they explained her health decline since a hip fracture and repair in June 2014. They say that she has no appetite and only has a 1/4 of one Poptart and a cup of coffee for breakfast. She eats a chicken tender and some french fries while I am in the room (her daughters say that is the most they have seen her eat in a while). She has lost weight and has become more weak. Tandy Gaw says that she has to assist her with any ambulation or activity (dressing) although she is able to feed herself. Her daughters have spoken with Dr. Jonnie Finner and they have all agreed not to pursue dialysis now or in the future given her weakened state. Her daughters are focused on making sure Ms. Kouba has comfort and quality of life at this point. We discussed/completed MOST form and they have decided DNR, full comfort with no return to the hospital, consider antibiotic therapy (although they do plan to return home with IV antibiotics), but do not want to pursue IVF or feeding tubes. They are also interested in having hospice support at home to help with these goals. Ms. Amorin has no complaints today. I will continue to follow and support Ms. Morella and her family holistically.    Goals of Care: 1.  Code Status: DNR   2. Scope of Treatment: 1. Vital Signs: routine 2. Respiratory/Oxygen:  yes 3. Nutritional Support/Tube Feeds: no 4. Antibiotics: yes 5. IVF: no 6. Review of Medications to be discontinued: None at this time. 7. Labs: yes as needed 8. Telemetry: yes as needed 9. Consults: case management for hospice   4. Disposition: Home with hospice support.    3. Symptom Management:   1. Bowel Regimen: Senna S prn.  2. Fever: Acetaminophen prn.  3. Weakness: Continue antibiotic/medical management. PT following.   4. Psychosocial: Emotional support provided to Ms. Pennywell and her daughters during difficult discussion.   Patient Documents Completed or Given: Document Given Completed  Advanced Directives Pkt    MOST  yes  DNR  yes  Gone from My Sight    Hard Choices yes     Brief HPI: 80 yo female with MRSA PNA requiring IV antibiotic therapy, acute renal failure, and failure to thrive.   ROS: Denies pain, nausea, sleep disturbance, anxiety.   PMH:  Past Medical History  Diagnosis Date  . HTN (hypertension)     2 YEARS  . Hypothyroidism   . Temporal arteritis   . Pulmonary embolism 1997  . Mitral regurgitation     a. mild by 01/2010 echo.  . Chronic diastolic CHF (congestive heart failure)     a. 01/2010 Echo: EF 60%, mild LVH, High LV filling pressures, No RWMA, mild MR, PASP 64mHg.  .Edwin Cap  . Chronic anemia   . Paroxysmal atrial fibrillation   .  Pulmonary HTN     a. PASP 42mHg by 01/2010 echo.  . S/P cardiac cath     a. 12/2009 R Heart Cath:  RA 12, RV 45/11, PA 47/21, PCWP MEAN 26, CO 4.56, CI 2.7.  . Ischemia     a. 12/2009 Myoview: Ant ischemia, EF 75%----NO CATH DONE DUE TO CKD  . End stage renal disease     a. s/p AVF - not on dialysis.  .Marland KitchenHistory of urinary frequency   . GERD (gastroesophageal reflux disease)   . Sleep trouble   . Constipation, chronic   . Hard of hearing   . Complication of anesthesia   . PONV (postoperative nausea and vomiting)   . Pneumonia 09/15/2012     PSH: Past Surgical History  Procedure  Laterality Date  . Temporal arteny bx    . Cataract surgery    . Eye surgery    . Abdominal hysterectomy    . Intramedullary (im) nail intertrochanteric Left 06/30/2012    Procedure: INTRAMEDULLARY (IM) NAIL INTERTROCHANTRIC LEFT;  Surgeon: JAlta Corning MD;  [facility]  Service: Orthopedics;  Laterality: Left;  . Injection knee Left 06/30/2012    Procedure: Left Knee Aspiration with Intra-Articular Injection;  Surgeon: JAlta Corning MD;  [facility] [facility]  Service: Orthopedics;  Laterality: Left;  . Video bronchoscopy Bilateral 09/21/2012    Procedure: VIDEO BRONCHOSCOPY WITHOUT FLUORO;  Surgeon: RCollene Gobble MD;  [facility] [facility]  Service: Cardiopulmonary;  Laterality: Bilateral;   I have reviewed the [place] [facility] and  If appropriate update it with new information. Allergies  Allergen Reactions  . Codeine Other (See Comments)    unknown  . Sulfa Antibiotics     unknown   Scheduled Meds: . albuterol  2.5 mg Nebulization QID  . calcitRIOL  0.25 mcg Oral Daily  . darbepoetin (ARANESP) injection - NON-DIALYSIS  40 mcg Subcutaneous Q Thu-1800  . feeding supplement (ENSURE COMPLETE)  237 mL Oral Q1400  . feeding supplement (ENSURE)  1 Container Oral TID BM  . levothyroxine  75 mcg Oral QAC breakfast  . metoprolol tartrate  50 mg Oral BID  . multivitamin with minerals  1 tablet Oral Daily  . nystatin  5 mL Oral TID  . oxybutynin  10 mg Oral QHS  . pantoprazole  40 mg Oral Daily  . sodium chloride  10-40 mL Intracatheter Q12H  . tolvaptan  15 mg Oral Q24H  . vancomycin  750 mg Intravenous Q48H  . warfarin  2.5 mg Oral ONCE-1800  . Warfarin - Pharmacist Dosing Inpatient   Does not apply q1800   Continuous Infusions:  PRN Meds:.acetaminophen, albuterol, lactulose, sodium chloride, traZODone    BP 124/50  Pulse 80  Temp(Src) 98.6 F (37 C) (Oral)  Resp 20  Ht _0  (1.575 m)  Wt 51.1 kg (112 lb 10.5 oz)  BMI 20.60 kg/m2  SpO2 100%   PPS: 40%   Intake/Output  Summary (Last 24 hours) at 03/16/13 1417 Last data filed at 03/16/13 1300  Gross per 24 hour  Intake    840 ml  Output   1300 ml  Net   -460 ml   LBM: 03/16/13                         Physical Exam:  General:  NAD, chronically ill appearing, thin/frail HEENT: [place]/AT, moist mucous membranes, no JVD, no icterus Chest: Bibasilar rhonchi scattered, occasional cough, no  labored breathing, room air CVS: RRR, S1 S2 Abdomen: Soft, NT, ND, +BS Ext: MAE, no edema, warm to touch Neuro: Alert, oriented to person/place, follows commands  Labs: CBC    Component Value Date/Time   WBC 12.3* 03/13/2013 0445   RBC 2.46* 03/13/2013 0445   RBC 3.34* 04/27/2008 0640   HGB 8.6* 03/13/2013 0445   HCT 24.1* 03/13/2013 0445   PLT 277 03/13/2013 0445   MCV 98.0 03/13/2013 0445   MCH 35.0* 03/13/2013 0445   MCHC 35.7 03/13/2013 0445   RDW 18.2* 03/13/2013 0445   LYMPHSABS 1.0 09/14/2012 2227   MONOABS 2.4* 09/14/2012 2227   EOSABS 0.0 09/14/2012 2227   BASOSABS 0.0 09/14/2012 2227    BMET    Component Value Date/Time   NA 122* 03/16/2013 1145   K 3.9 03/16/2013 0350   CL 82* 03/16/2013 0350   CO2 31 03/16/2013 0350   GLUCOSE 91 03/16/2013 0350   BUN 36* 03/16/2013 0350   CREATININE 1.86* 03/16/2013 0350   CALCIUM 9.8 03/16/2013 0350   CALCIUM 8.6 06/16/2012 1541   GFRNONAA 23* 03/16/2013 0350   GFRAA 27* 03/16/2013 0350    CMP     Component Value Date/Time   NA 122* 03/16/2013 1145   K 3.9 03/16/2013 0350   CL 82* 03/16/2013 0350   CO2 31 03/16/2013 0350   GLUCOSE 91 03/16/2013 0350   BUN 36* 03/16/2013 0350   CREATININE 1.86* 03/16/2013 0350   CALCIUM 9.8 03/16/2013 0350   CALCIUM 8.6 06/16/2012 1541   PROT 7.8 09/14/2012 2227   ALBUMIN 1.9* 03/16/2013 0350   AST 28 09/14/2012 2227   ALT 13 09/14/2012 2227   ALKPHOS 107 09/14/2012 2227   BILITOT 1.0 09/14/2012 2227   GFRNONAA 23* 03/16/2013 0350   GFRAA 27* 03/16/2013 0350     Time In Time Out Total Time Spent with Patient Total Overall Time  1300 1430 11mn  960m    Greater than 50%  of this time was spent counseling and coordinating care related to the above assessment and plan.  AlVinie SillNP Palliative Medicine Team Pager # [phone]-F 8a-5p) Team Phone # [phone]/Weekends)

## 2013-03-17 DIAGNOSIS — Z515 Encounter for palliative care: Secondary | ICD-10-CM

## 2013-03-17 LAB — CBC
HCT: 24.1 % — ABNORMAL LOW (ref 36.0–46.0)
Hemoglobin: 7.9 g/dL — ABNORMAL LOW (ref 12.0–15.0)
MCH: 33.2 pg (ref 26.0–34.0)
MCHC: 32.8 g/dL (ref 30.0–36.0)
MCV: 101.3 fL — ABNORMAL HIGH (ref 78.0–100.0)
PLATELETS: 291 10*3/uL (ref 150–400)
RBC: 2.38 MIL/uL — ABNORMAL LOW (ref 3.87–5.11)
RDW: 18.4 % — AB (ref 11.5–15.5)
WBC: 7.4 10*3/uL (ref 4.0–10.5)

## 2013-03-17 LAB — PROTIME-INR
INR: 1.92 — ABNORMAL HIGH (ref 0.00–1.49)
Prothrombin Time: 21.4 seconds — ABNORMAL HIGH (ref 11.6–15.2)

## 2013-03-17 MED ORDER — WARFARIN SODIUM 2 MG PO TABS
2.0000 mg | ORAL_TABLET | Freq: Once | ORAL | Status: DC
  Filled 2013-03-17: qty 1

## 2013-03-17 MED ORDER — SENNOSIDES-DOCUSATE SODIUM 8.6-50 MG PO TABS: 2.0000 | ORAL_TABLET | Freq: Every evening | ORAL | Status: DC | PRN

## 2013-03-17 NOTE — Progress Notes (Signed)
PULMONARY PROGRESS NOTE  Name: Cheryl FassMargel L Prazak MRN: [id number] DOB: 10/10/1925    ADMISSION DATE:  03/09/2013  PRIMARY SERVICE:  PCCM  CHIEF COMPLAINT:  SOB, MRSA PNA  BRIEF PATIENT DESCRIPTION: 80 [facility]/o F [facility] with productive cough, LE Swelling, & sputum positive for MRSA.    SIGNIFICANT EVENTS / STUDIES:  [facility] complete occlusion of RML bronchus.  CT concerning for endobronchial lesion 09/21/12 - FOB: non-diagnostic> single focus abnormal glandular tissue, unable to definitively call malignancy 02/26/12 - [facility] flare. Sputum culture obtained=> MRSA (empirically treated with Cipro) .................................................................................................................................................................................. 03/09/12 - seen in office by RB - c/o increased LE swelling, cough with dark sputum production.  Sputum noted to be positive for MRSA.  Hx of enterobacter in past (none this culture). Rx called for Zyvox but pt unable to afford (>1500$).  [facility]     CULTURES: Sputum 2/6>>>abundant MRSA  ANTIBIOTICS: Vancomycin (MRSA) 2/18>>>  SUBJECTIVE:  Denies SOB.  Feels weak.    VITAL SIGNS: Temp:  [97.9 F (36.6 C)-98.7 F (37.1 C)] 97.9 F (36.6 C) (02/26 1408) Pulse Rate:  [76-87] 81 (02/26 1408) Resp:  [18-20] 20 (02/26 1408) BP: (127-135)/(41-51) 127/41 mmHg (02/26 1408) SpO2:  [93 %-100 %] 100 % (02/26 1408)  PHYSICAL EXAMINATION: Gen: NAD Lungs:  few scattered hronchi Cardiovascular: RRR s M Ext: No focal deficits Neuro: intact   I have reviewed all of today's lab results. Relevant abnormalities are discussed in the A/P section  IMAGING:  No new CXR 2/26   ASSESSMENT / PLAN:  MRSA PNA Bronchiectasis, without acute bronchospasm  Remote hx PE Plan: -Complete 14-21 days vancomycin Have requested PICC as  she is not a candidate for HD   CKD  Not a candidate for HD Hyponatremia, chronic Plan: Renal following  Chronic Diastolic CHF PAF HTN Plan: -continue current Rx  Hypothyroidism  Plan: -continue synthroid  Overactive Bladder Plan: -continue ditropan  Depression  Plan: -continue zoloft  Coagulopathy -chronic coumadin administration Plan: -coumadin per pharmacy    Plan PICC and DC home 2/27 with plan to complete 14-21 days IV vancomycin. Care manager to see and assist in home health needs. Social work to see and execute out of hospital DNR  Billy Fischeravid Belkys Henault, MD ; [facility] service Mobile [phone].  After 5:30 PM or weekends, call [phone]

## 2013-03-17 NOTE — Progress Notes (Signed)
[facility]  [facility]  [facility] is providing the following services: Overbed table, wheelchair, Oxygen at 2 lpm PRN  If patient discharges after hours, please call [phone].   Cheryl [name] 03/17/2013, 12:46 PM

## 2013-03-17 NOTE — Progress Notes (Signed)
Nutrition Brief Note  Chart reviewed for nutrition follow up Pt now transitioning to comfort care.  No further nutrition interventions warranted at this time.  Please re-consult as needed.   Lloyd HugerSarah F Jahnasia Tatum MS RD LDN Clinical Dietitian Pager:[phone]

## 2013-03-17 NOTE — Progress Notes (Signed)
ANTICOAGULATION CONSULT NOTE - Follow Up Consult  Pharmacy Consult for warfarin Indication: atrial fibrillation  Allergies  Allergen Reactions  . Codeine Other (See Comments)    unknown  . Sulfa Antibiotics     unknown   Patient Measurements: Height: 5\' 2"  (157.5 cm) Weight: 112 lb 10.5 oz (51.1 kg) IBW/kg (Calculated) : 50.1  Vital Signs: Temp: 98.7 F (37.1 C) (02/26 0609) Temp src: Oral (02/26 0609) BP: 135/49 mmHg (02/26 1119) Pulse Rate: 87 (02/26 1119)  Labs:  Recent Labs  03/15/13 0315 03/16/13 0350 03/17/13 0630 03/17/13 1125  HGB  --   --  7.9*  --   HCT  --   --  24.1*  --   PLT  --   --  291  --   LABPROT 25.0* 20.5*  --  21.4*  INR 2.36* 1.82*  --  1.92*  CREATININE 1.82* 1.86*  --   --    Assessment: [age] YOF  Presents from [facility] 2/18 with MRSA PNA, orders for pharmacy to continue warfarin therapy for h/o afib. Admit INR 2.96, no Warfarin given day of admit. Home dose reported as 3mg  daily, last dose unknown. Started on Aranesp (on Epo prior to admission).  Today's INR = 1.92, trending up.  Warfarin from 2/20: 1.5, 0.5, 0.5, 1.5mg , 1.5mg , 2.5mg   2/22: Hgb = 7.9, decreasing, Platelets WNL  No significant drug interactions noted  PO intake low  Goal of Therapy:  INR 2-3 Monitor platelets by anticoagulation protocol: Yes   Plan:   Warfarin 2mg  po x 1 tonight  Daily INR  Haynes Hoehnolleen Breiona Couvillon, PharmD, BCPS 03/17/2013, 1:38 PM  Pager: [phone]

## 2013-03-18 ENCOUNTER — Inpatient Hospital Stay (HOSPITAL_COMMUNITY): Payer: Medicare Other

## 2013-03-18 LAB — BASIC METABOLIC PANEL
BUN: 34 mg/dL — AB (ref 6–23)
CO2: 29 meq/L (ref 19–32)
CREATININE: 2.11 mg/dL — AB (ref 0.50–1.10)
Calcium: 10.2 mg/dL (ref 8.4–10.5)
Chloride: 90 mEq/L — ABNORMAL LOW (ref 96–112)
GFR calc non Af Amer: 20 mL/min — ABNORMAL LOW (ref >90)
GFR, EST AFRICAN AMERICAN: 23 mL/min — AB (ref >90)
Glucose, Bld: 99 mg/dL (ref 70–99)
Potassium: 3.7 mEq/L (ref 3.7–5.3)
Sodium: 130 mEq/L — ABNORMAL LOW (ref 137–147)

## 2013-03-18 LAB — PROTIME-INR
INR: 1.97 — ABNORMAL HIGH (ref 0.00–1.49)
Prothrombin Time: 21.8 seconds — ABNORMAL HIGH (ref 11.6–15.2)

## 2013-03-18 LAB — VANCOMYCIN, RANDOM: Vancomycin Rm: 22.1 ug/mL

## 2013-03-18 MED ORDER — VANCOMYCIN HCL IN DEXTROSE 750-5 MG/150ML-% IV SOLN: 750.0000 mg | INTRAVENOUS | Status: AC

## 2013-03-18 MED ORDER — SODIUM CHLORIDE 0.9 % IJ SOLN
10.0000 mL | INTRAMUSCULAR | Status: DC | PRN
  Administered 2013-03-19: 10 mL

## 2013-03-18 MED ORDER — WARFARIN SODIUM 2 MG PO TABS
2.0000 mg | ORAL_TABLET | ORAL | Status: AC
  Administered 2013-03-18: 2 mg via ORAL
  Filled 2013-03-18: qty 1

## 2013-03-18 MED ORDER — NYSTATIN 100000 UNIT/ML MT SUSP: 5.0000 mL | Freq: Three times a day (TID) | OROMUCOSAL | Status: AC

## 2013-03-18 MED ORDER — ALBUTEROL SULFATE (2.5 MG/3ML) 0.083% IN NEBU
2.5000 mg | INHALATION_SOLUTION | Freq: Two times a day (BID) | RESPIRATORY_TRACT | Status: DC
  Administered 2013-03-18 – 2013-03-19 (×2): 2.5 mg via RESPIRATORY_TRACT
  Filled 2013-03-18 (×2): qty 3

## 2013-03-18 NOTE — Progress Notes (Addendum)
ANTIBIOTIC CONSULT NOTE - Follow-up  Pharmacy Consult for Vancomycin Indication: MRSA PNA  Allergies  Allergen Reactions  . Codeine Other (See Comments)    unknown  . Sulfa Antibiotics     unknown   Patient Measurements: Height: 5\' 2"  (157.5 cm) Weight: 112 lb 10.5 oz (51.1 kg) IBW/kg (Calculated) : 50.1  Vital Signs: Temp: 98.1 F (36.7 C) (02/27 0402) Temp src: Oral (02/27 0402) BP: 116/53 mmHg (02/27 0402) Pulse Rate: 88 (02/27 0402) Intake/Output from previous day: 02/26 0701 - 02/27 0700 In: 600 [P.O.:600] Out: 1075 [Urine:1075]  Labs:  Recent Labs  03/16/13 0350 03/17/13 0630 03/18/13 0345  WBC  --  7.4  --   HGB  --  7.9*  --   PLT  --  291  --   CREATININE 1.86*  --  2.11*   Estimated Creatinine Clearance: 15.1 ml/min (by C-G formula based on Cr of 2.11).  Recent Labs  03/18/13 1105  VANCORANDOM 22.1     Assessment: 88 yoF with CKD4, MRSA PNA, culture obtained 2/6 @ [place]. Planned treatment with po Linezolid, but patient could not afford. [facility]. Pharmacy to dose Vancomycin  Antiinfectives 2/18 >> Vanc >>  Tmax: Afeb WBCs: Now WNL Renal: ESRD - SCr back up to 2.11, (>3 on admit), Cl ~ 15 CG (21N)  2/6 sputum: (@ [place]) MRSA, sens to Clinda,Gent,TCN,Zyvox, Vanc  No cultures this admit  2/27: Random vancomycin level @ 1100 = 22.1 (~11 hours prior to next scheduled dose)  Goal of Therapy:  Vancomycin trough level 15-20 mcg/ml  Plan:  - Continue Vancomycin 750mg  IV q48h, will adjust dose to 8PM tonight as this is the latest [facility] can visit the home.   -Check vancomycin trough and BMET prior to dose on 03/20/13.    Thank you for the consult.  Haynes Hoehnolleen Marrion Accomando, PharmD, BCPS 03/18/2013, 1:18 PM  Pager: [phone]

## 2013-03-18 NOTE — Progress Notes (Signed)
Physical Therapy Treatment Patient Details Name: Cheryl [name] MRN: [id number] DOB: 06/19/1925 Today's Date: 03/18/2013 Time: [id number] PT Time Calculation (min): 23 min  PT Assessment / Plan / Recommendation  History of Present Illness 80 yo female admitted with acute renal failure, SOB, bronchiectesis with bronchitis, pna. Hx of HTN, P AFIB, PE, pulm HTN, L LE IM nail   PT Comments   Tolerated activity well today. Plan is for pt to d/c to daughter's home with hospice care.   Follow Up Recommendations  Home health PT;Supervision/Assistance - 24 hour (noted pt to d/c under hospice care. Recommend HHPT if possible)     Does the patient have the potential to tolerate intense rehabilitation     Barriers to Discharge        Equipment Recommendations  None recommended by PT    Recommendations for Other Services    Frequency Min 3X/week   Progress towards PT Goals Progress towards PT goals: Progressing toward goals  Plan Current plan remains appropriate    Precautions / Restrictions Precautions Precautions: Fall Restrictions Weight Bearing Restrictions: No   Pertinent Vitals/Pain No c/o pain    Mobility  Bed Mobility Overal bed mobility: Needs Assistance Bed Mobility: Supine to Sit;Sit to Supine Supine to sit: Min guard;HOB elevated Sit to supine: Min assist;HOB elevated General bed mobility comments: Increased time. Assist for one leg back onto bed.  Transfers Overall transfer level: Needs assistance Equipment used: Rolling walker (2 wheeled) Transfers: Sit to/from Stand Sit to Stand: Min assist General transfer comment: Assist to rise, stabilize. VCS safety, hand placement Ambulation/Gait Ambulation/Gait assistance: Min assist Ambulation Distance (Feet): 65 Feet Assistive device: Rolling walker (2 wheeled) Gait Pattern/deviations: Step-through pattern;Decreased stride length;Decreased step length - left;Decreased step length - right;Trunk flexed General Gait  Details: slow gait speed. fatigues failry easily. vcs safety, distance from walker.  Limited activity tolerance.    Exercises     PT Diagnosis:    PT Problem List:   PT Treatment Interventions:     PT Goals (current goals can now be found in the care plan section)    Visit Information  Last PT Received On: 03/18/13 Assistance Needed: +1 History of Present Illness: 80 yo female admitted with acute renal failure, SOB, bronchiectesis with bronchitis, pna. Hx of HTN, P AFIB, PE, pulm HTN, L LE IM nail    Subjective Data      Cognition  Cognition Arousal/Alertness: Awake/alert Behavior During Therapy: WFL for tasks assessed/performed Overall Cognitive Status: Within Functional Limits for tasks assessed Memory: Decreased short-term memory    Balance     End of Session PT - End of Session Equipment Utilized During Treatment: Gait belt Activity Tolerance: Patient limited by fatigue Patient left: in bed;with call bell/phone within reach;with family/visitor present   GP     Cheryl [name], MPT Pager: [phone]

## 2013-03-18 NOTE — Progress Notes (Signed)
Peripherally Inserted Central Catheter/Midline Placement  The IV Nurse has discussed with the patient and/or persons authorized to consent for the patient, the purpose of this procedure and the potential benefits and risks involved with this procedure.  The benefits include less needle sticks, lab draws from the catheter and patient may be discharged home with the catheter.  Risks include, but not limited to, infection, bleeding, blood clot (thrombus formation), and puncture of an artery; nerve damage and irregular heat beat.  Alternatives to this procedure were also discussed.  PICC/Midline Placement Documentation        Lisabeth DevoidGibbs, Jeannine Pennisi Jeanette 03/18/2013, 3:18 PM Consent obtained from daughter at bedside by Lazarus Gowdaenee Newman, RN

## 2013-03-18 NOTE — Progress Notes (Signed)
ANTICOAGULATION CONSULT NOTE - Follow Up Consult  Pharmacy Consult for warfarin Indication: atrial fibrillation  Allergies  Allergen Reactions  . Codeine Other (See Comments)    unknown  . Sulfa Antibiotics     unknown   Patient Measurements: Height: 5\' 2"  (157.5 cm) Weight: 112 lb 10.5 oz (51.1 kg) IBW/kg (Calculated) : 50.1  Vital Signs: Temp: 98.1 F (36.7 C) (02/27 0402) Temp src: Oral (02/27 0402) BP: 116/53 mmHg (02/27 0402) Pulse Rate: 88 (02/27 0402)  Labs:  Recent Labs  03/16/13 0350 03/17/13 0630 03/17/13 1125 03/18/13 0345  HGB  --  7.9*  --   --   HCT  --  24.1*  --   --   PLT  --  291  --   --   LABPROT 20.5*  --  21.4* 21.8*  INR 1.82*  --  1.92* 1.97*  CREATININE 1.86*  --   --  2.11*   Assessment: [age] YOF  Presents from [facility] 2/18 with MRSA PNA, orders for pharmacy to continue warfarin therapy for h/o afib. Admit INR 2.96, no Warfarin given day of admit. Home dose reported as 3mg  daily, last dose unknown. Started on Aranesp (on Epo prior to admission).  Today's INR = 1.97, almost therapeutic.  Unfortunately, warfarin dose was not administered to patient last night, no documentation from RN for reason medication held.  No bleeding noted or passed on in report to AM shift today.  Warfarin from 2/20: 1.5, 0.5, 0.5, 1.5mg , 1.5mg , 2.5mg , 0mg   2/26: Hgb = 7.9, decreasing, Platelets WNL  No significant drug interactions noted  PO intake low  Noted plans for possible D/C today  Goal of Therapy:  INR 2-3 Monitor platelets by anticoagulation protocol: Yes   Plan:   Warfarin 2mg  po x 1 NOW  Daily INR  If patient is discharged today, would recommend reducing home dose warfarin to 1.5mg  daily except 3mg  M/W/F with CLOSE INR follow-up.  Pt has required significantly lower doses of warfarin in hospital.    Haynes Hoehnolleen Kama Cammarano, PharmD, BCPS 03/18/2013, 8:50 AM  Pager: [phone]

## 2013-03-18 NOTE — Discharge Summary (Signed)
Physician Discharge Summary  Patient ID: Cheryl [name] MRN: [id number] DOB/AGE: 1925/03/13 80 y.o.  Admit date: 03/09/2013 Discharge date: 03/19/2013    Discharge Diagnoses:  MRSA PNA Bronchiectasis  Remote Hx of PE CKD Not HD Candidate  Hyponatremia PAF HTN Hypothyroidism Overactive Bladder Coagulopathy  Deconditioning                                                                       DISCHARGE PLAN BY DIAGNOSIS     MRSA PNA  Bronchiectasis, without acute bronchospasm  Remote hx PE  DNR  Discharge Plan: -Plan for 14 days total vancomycin -->start date 2/18, plan through 3/1 -trough & BMP to be drawn before 3/1 dosing of vancomycin at home  -Has ROV arranged with [facility] - At that time, repeat CXR and decide whether further abx indicated & if PICC line can be removed -Discharge home with PICC, routine PICC line care per [facility] with flushes BID -[facility] will follow at home   CKD  Not a candidate for HD  Hyponatremia, chronic - improved   Discharge Plan:  -no HD if patient deteriorates   Chronic Diastolic CHF  PAF  HTN   Discharge Plan:  continue current Rx   Hypothyroidism   Discharge Plan: continue synthroid   Overactive Bladder   Discharge Plan:  continue ditropan   Depression   Discharge Plan: continue zoloft   Coagulopathy -chronic coumadin administration   Discharge Plan: -continue coumadin, f/u with coumadin clinic  DNR -They have agreed on d/c home with antibiotics, full DNR, no artificial feeds, no dialysis and no return to the hospital. If /when pt deteriorates at home they are going to pursue comfort care. -home PT as tolerated                  DISCHARGE SUMMARY   Cheryl [name] is a 80 y.o. y/o female with a PMH of HTN, PAF, CAD, Chronic diastolic CHF, GERD, PE (5956), Hypothyroidism, Temporal Arteritis, Insomnia, ESRD, and bronchiectasis who was admitted from the Pulmonary Office on 2/18  with productive cough and increased LE swelling.  Patient was recently seen in office on 02/25/13 for acute bronchiectasis flare.  Sputum was cultured at that time and grew MRSA.  She was empirically treated at that time with Cipro.  Patient was brought back to the office and cultures were reviewed with planned change to Zyvox.  Unfortunately, the medication was quite expensive and patient could not afford the copay (1500$).  She was directly admitted to [facility] for IV therapy with vancomycin.  A central line was placed for IV access.  Patient's vancomycin was dosed per pharmacy (Q48 hour dosing based on renal function).  She was evaluated by Nephrology for worsening renal failure & hyponatremia.  Patient and family also requested [facility] involvement.  She, along with her daughter, agreed for DNR with plan for discharge to home with IV antibiotics.  However, if the patient were to deteriorate, they would want to progress toward comfort focused care.  Given her multiple co-morbidities, they would not want hemodialysis if she were to decline or be re-admitted.  See plan as above.  SIGNIFICANT EVENTS / STUDIES:  09/14/12 - admit to [facility] for PNA, complete occlusion of RML bronchus. CT concerning for endobronchial lesion  09/21/12 - FOB: non-diagnostic> single focus abnormal glandular tissue, unable to definitively call malignancy  02/26/12 - seen by [facility] for acute bronchiectasis flare. Sputum culture obtained=> MRSA (empirically treated with Cipro)  ..................................................................................................................................................................................  03/09/12 - seen in office by RB - c/o increased LE swelling, cough with dark sputum production. Sputum noted to be positive for MRSA. Hx of enterobacter in past (none this culture). Rx called for Zyvox but pt unable to afford (>1500$). Direct admit to [facility]  for further work up.   CULTURES:  Sputum 2/6>>>abundant MRSA   ANTIBIOTICS:  Vancomycin (MRSA) 2/18>>>    Filed Vitals:   03/18/13 2205 03/19/13 0633 03/19/13 0952 03/19/13 1345  BP:  152/38  157/78  Pulse:  81  83  Temp:  97.3 F (36.3 C)  98 F (36.7 C)  TempSrc:  Oral  Oral  Resp:  20  20  Height:      Weight:      SpO2: 99% 97% 95% 97%     Discharge Labs  BMET  Recent Labs Lab 03/13/13 0445 03/14/13 0345 03/15/13 0315 03/15/13 1718 03/15/13 2310 03/16/13 0350 03/16/13 1145 03/18/13 0345  NA 123* 124* 119* 124* 122* 123* 122* 130*  K 3.7 3.2* 4.1  --   --  3.9  --  3.7  CL 82* 83* 80*  --   --  82*  --  90*  CO2 29 29 29   --   --  31  --  29  GLUCOSE 101* 94 105*  --   --  91  --  99  BUN 42* 39* 38*  --   --  36*  --  34*  CREATININE 2.22* 1.94* 1.82*  --   --  1.86*  --  2.11*  CALCIUM 9.1 9.4 9.7  --   --  9.8  --  10.2  MG 1.8 1.7 1.8  --   --  1.8  --   --   PHOS 4.8* 5.0* 4.4  --   --  4.5  --   --     CBC  Recent Labs Lab 03/13/13 0445 03/17/13 0630  HGB 8.6* 7.9*  HCT 24.1* 24.1*  WBC 12.3* 7.4  PLT 277 291   Anti-Coagulation  Recent Labs Lab 03/15/13 0315 03/16/13 0350 03/17/13 1125 03/18/13 0345 03/19/13 0350  INR 2.36* 1.82* 1.92* 1.97* 2.00*       Discharge Orders   Future Appointments Provider Department Dept Phone   03/22/2013 4:00 PM Collene Gobble, MD [facility] [phone]   04/06/2013 3:00 PM Collene Gobble, MD [facility] [phone]   Future Orders Complete By Expires   Discharge patient  As directed          Medication List    STOP taking these medications       HYDROcodone-homatropine 5-1.5 MG/5ML syrup  Commonly known as:  HYCODAN     linezolid 600 MG tablet  Commonly known as:  ZYVOX      TAKE these medications       AEROCHAMBER PLUS inhaler  Use as instructed     albuterol 108 (90 BASE) MCG/ACT inhaler  Commonly known as:  PROVENTIL HFA;VENTOLIN HFA  Inhale 2 puffs  into the lungs every 6 (six) hours as needed for wheezing or shortness of breath.     feeding supplement (ENSURE  COMPLETE) Liqd  Take 237 mLs by mouth daily.     levothyroxine 75 MCG tablet  Commonly known as:  SYNTHROID, LEVOTHROID  Take 75 mcg by mouth daily.     Melatonin 5 MG Tabs  Take 5 mg by mouth at bedtime.     metoprolol 100 MG tablet  Commonly known as:  LOPRESSOR  Take 0.5 tablets (50 mg total) by mouth 2 (two) times daily.     multivitamin with minerals Tabs tablet  Take 1 tablet by mouth daily. Centrum Silver     nystatin 100000 UNIT/ML suspension  Commonly known as:  MYCOSTATIN  Take 5 mLs (500,000 Units total) by mouth 3 (three) times daily.     omeprazole 20 MG capsule  Commonly known as:  PRILOSEC  Take 20 mg by mouth daily.     oxybutynin 10 MG 24 hr tablet  Commonly known as:  DITROPAN-XL  Take 10 mg by mouth daily.     polyethylene glycol packet  Commonly known as:  MIRALAX / GLYCOLAX  Take 17 g by mouth daily.     sertraline 25 MG tablet  Commonly known as:  ZOLOFT  Take 25 mg by mouth daily.     Vancomycin 750 MG/150ML Soln  Commonly known as:  VANCOCIN  Inject 150 mLs (750 mg total) into the vein every other day.     warfarin 3 MG tablet  Commonly known as:  COUMADIN  Take 3 mg by mouth every evening.      ASK your doctor about these medications       amLODipine 5 MG tablet  Commonly known as:  NORVASC  Take 5 mg by mouth daily.     calcitRIOL 0.25 MCG capsule  Commonly known as:  ROCALTROL  Take 0.25 mcg by mouth daily.     epoetin alfa 10000 UNIT/ML injection  Commonly known as:  EPOGEN,PROCRIT  Inject 10,000 Units into the vein once a week.     furosemide 40 MG tablet  Commonly known as:  LASIX  Take 40 mg by mouth 2 (two) times daily.        Disposition:  Home with hospice involvement, IV vancomycin   Discharged Condition: Cheryl [name] has met maximum benefit of inpatient care and is medically stable and cleared  for discharge.  Patient is pending follow up as above.      Time spent on disposition:  Greater than 35 minutes.   Signed:

## 2013-03-18 NOTE — Progress Notes (Signed)
PULMONARY PROGRESS NOTE  Name: Cheryl [name] MRN: [id number] DOB: 03/13/1925    ADMISSION DATE:  03/09/2013  PRIMARY SERVICE:  PCCM  CHIEF COMPLAINT:  SOB, MRSA PNA  BRIEF PATIENT DESCRIPTION: 80 y/o F [facility] with productive cough, LE Swelling, & sputum positive for MRSA.    SIGNIFICANT EVENTS / STUDIES:  [facility]/12 [facility], complete occlusion of RML bronchus.  CT concerning for endobronchial lesion 09/21/12 - FOB: non-diagnostic> single focus abnormal glandular tissue, unable to definitively call malignancy 02/26/12 - [facility]. Sputum culture obtained=> MRSA (empirically treated with Cipro) .................................................................................................................................................................................. 03/09/12 - seen in office by RB - c/o increased LE swelling, cough with dark sputum production.  Sputum noted to be positive for MRSA.  Hx of enterobacter in past (none this culture). Rx called for Zyvox but pt unable to afford (>1500$).  [facility].     CULTURES: Sputum 2/6>>>abundant MRSA  ANTIBIOTICS: Vancomycin (MRSA) 2/18>>>  SUBJECTIVE:  No distress. No new complaints. Mild confusion  VITAL SIGNS: Temp:  [97.9 F (36.6 C)-98.1 F (36.7 C)] 98.1 F (36.7 C) (02/27 0402) Pulse Rate:  [79-88] 88 (02/27 0402) Resp:  [20] 20 (02/27 0402) BP: (116-138)/(41-53) 116/53 mmHg (02/27 0402) SpO2:  [93 %-100 %] 93 % (02/27 0751)  PHYSICAL EXAMINATION: Gen: NAD Lungs:  few scattered hronchi Cardiovascular: RRR s M Ext: No focal deficits Neuro: intact   I have reviewed all of today's lab results. Relevant abnormalities are discussed in the A/P section  IMAGING:  No new CXR 2/26   ASSESSMENT / PLAN:  MRSA PNA Bronchiectasis, without acute bronchospasm  Remote hx PE Plan: Complete 14-21 days vancomycin (start  date 2/18) PICC planned for later today Has ROV arranged with [facility] - At that time, would repeat CXR and decide whether further abx indicated   CKD  Not a candidate for HD Hyponatremia, chronic - improved Plan: Renal following  Chronic Diastolic CHF PAF HTN Plan: continue current Rx  Hypothyroidism  Plan: continue synthroid  Overactive Bladder Plan: continue ditropan  Depression  Plan: continue zoloft  Coagulopathy -chronic coumadin administration Plan: coumadin per pharmacy    PICC to be placed today. Has Vanc dose ordered for 10 PM tonight. Plan DC to home in AM 2/28. Hospice has been arranged  Billy Fischeravid Stephani Janak, MD ; [facility] service Mobile [phone].  After 5:30 PM or weekends, call [phone]

## 2013-03-18 NOTE — Progress Notes (Signed)
Per MD order, central line removed. IV cathter intact. Vaseline pressure gauze to site, pressure held x 5 min, no bleeding to site. Pt instructed not to get out of bed for 30 min after the removal of the central line. Instucted to keep dressing CDI x 24hours, if bleeding occurs hold pressure and call nurse at station. Instructions given to family and nurse. Consuello Masseimmons, Nathanyal Ashmead M

## 2013-03-18 NOTE — Progress Notes (Signed)
Progress Note from the Palliative Medicine Team at [facility]  Subjective: Cheryl [name] is resting comfortably today and is eating a piece of toast with jelly and bacon. She has no complaints and she is breathing comfortably on room air. She is having PICC placed today and is planning home with hospice for tomorrow with PT and IV antibiotics for MRSA pneumonia. She has responded well to antibiotics with improved coughing and oxygenation. Her daughters are happy she will be able to have hospice (confirmed with Olegario Messier - Sports [employer]) and continue her IV antibiotics course.    Objective: Allergies  Allergen Reactions  . Codeine Other (See Comments)    unknown  . Sulfa Antibiotics     unknown   Scheduled Meds: . albuterol  2.5 mg Nebulization QID  . calcitRIOL  0.25 mcg Oral Daily  . darbepoetin (ARANESP) injection - NON-DIALYSIS  40 mcg Subcutaneous Q Thu-1800  . feeding supplement (ENSURE COMPLETE)  237 mL Oral Q1400  . feeding supplement (ENSURE)  1 Container Oral TID BM  . levothyroxine  75 mcg Oral QAC breakfast  . metoprolol tartrate  50 mg Oral BID  . multivitamin with minerals  1 tablet Oral Daily  . nystatin  5 mL Oral TID  . oxybutynin  10 mg Oral QHS  . pantoprazole  40 mg Oral Daily  . sodium chloride  10-40 mL Intracatheter Q12H  . sodium chloride  2 g Oral BID WC  . vancomycin  750 mg Intravenous Q48H  . Warfarin - Pharmacist Dosing Inpatient   Does not apply q1800   Continuous Infusions:  PRN Meds:.acetaminophen, albuterol, lactulose, senna-docusate, sodium chloride, traZODone  BP 116/53  Pulse 88  Temp(Src) 98.1 F (36.7 C) (Oral)  Resp 20  Ht 5\' 2"  (1.575 m)  Wt 51.1 kg (112 lb 10.5 oz)  BMI 20.60 kg/m2  SpO2 93%   PPS: 40%  Pain Score: none    Intake/Output Summary (Last 24 hours) at 03/18/13 1116 Last data filed at 03/18/13 0415  Gross per 24 hour  Intake      0 ml  Output    875 ml  Net   -875 ml      LBM: 03/18/13      Physical  Exam:  General: NAD, chronically ill appearing, thin/frail, pleasant HEENT: [place]/AT, moist mucous membranes, no JVD, no icterus  Chest: Expiratory wheeze throughout, bibasilar rhonchi improved, no labored breathing, room air  CVS: RRR, S1 S2  Abdomen: Soft, NT, ND, +BS  Ext: MAE, no edema, warm to touch  Neuro: Alert, oriented to person, follows commands  Labs: CBC    Component Value Date/Time   WBC 7.4 03/17/2013 0630   RBC 2.38* 03/17/2013 0630   RBC 3.34* 04/27/2008 0640   HGB 7.9* 03/17/2013 0630   HCT 24.1* 03/17/2013 0630   PLT 291 03/17/2013 0630   MCV 101.3* 03/17/2013 0630   MCH 33.2 03/17/2013 0630   MCHC 32.8 03/17/2013 0630   RDW 18.4* 03/17/2013 0630   LYMPHSABS 1.0 09/14/2012 2227   MONOABS 2.4* 09/14/2012 2227   EOSABS 0.0 09/14/2012 2227   BASOSABS 0.0 09/14/2012 2227    BMET    Component Value Date/Time   NA 130* 03/18/2013 0345   K 3.7 03/18/2013 0345   CL 90* 03/18/2013 0345   CO2 29 03/18/2013 0345   GLUCOSE 99 03/18/2013 0345   BUN 34* 03/18/2013 0345   CREATININE 2.11* 03/18/2013 0345   CALCIUM 10.2 03/18/2013 0345   CALCIUM 8.6 06/16/2012  1541   GFRNONAA 20* 03/18/2013 0345   GFRAA 23* 03/18/2013 0345    CMP     Component Value Date/Time   NA 130* 03/18/2013 0345   K 3.7 03/18/2013 0345   CL 90* 03/18/2013 0345   CO2 29 03/18/2013 0345   GLUCOSE 99 03/18/2013 0345   BUN 34* 03/18/2013 0345   CREATININE 2.11* 03/18/2013 0345   CALCIUM 10.2 03/18/2013 0345   CALCIUM 8.6 06/16/2012 1541   PROT 7.8 09/14/2012 2227   ALBUMIN 1.9* 03/16/2013 0350   AST 28 09/14/2012 2227   ALT 13 09/14/2012 2227   ALKPHOS 107 09/14/2012 2227   BILITOT 1.0 09/14/2012 2227   GFRNONAA 20* 03/18/2013 0345   GFRAA 23* 03/18/2013 0345    Assessment and Plan: 1. Code Status: DNR 2. Symptom Control:  1. Bowel Regimen: Senna S prn.  2. Fever: Acetaminophen prn.  3. Weakness: Continue antibiotic/medical management. PT following.  3. Psycho/Social: Emotional support provided to patient and  daughters.  4. Disposition: Home with PICC/IV antibiotics and hospice support.    Time In Time Out Total Time Spent with Patient Total Overall Time  1045 1110 15min 25min    Greater than 50%  of this time was spent counseling and coordinating care related to the above assessment and plan.  Yong ChannelAlicia Shawntina Diffee, NP Palliative Medicine Team Pager # [phone] (M-F 8a-5p) Team Phone # [phone] (Nights/Weekends)

## 2013-03-19 LAB — PROTIME-INR
INR: 2 — AB (ref 0.00–1.49)
PROTHROMBIN TIME: 22.1 s — AB (ref 11.6–15.2)

## 2013-03-19 MED ORDER — HEPARIN SOD (PORK) LOCK FLUSH 100 UNIT/ML IV SOLN
250.0000 [IU] | INTRAVENOUS | Status: AC | PRN
  Administered 2013-03-19: 250 [IU]

## 2013-03-19 MED ORDER — WARFARIN 0.5 MG HALF TABLET
1.5000 mg | ORAL_TABLET | Freq: Once | ORAL | Status: DC
  Filled 2013-03-19: qty 1

## 2013-03-19 MED ORDER — METOPROLOL TARTRATE 100 MG PO TABS: 50.0000 mg | ORAL_TABLET | Freq: Two times a day (BID) | ORAL | Status: AC

## 2013-03-19 NOTE — Discharge Summary (Signed)
The pt has greatly improved with IV abx, and will need further therapy at home for her MRSA pna.  She is to continue on IV meds via PICC line.  She has also had significant improvement in her hyponatremia, and [facility].

## 2013-03-19 NOTE — Progress Notes (Signed)
PULMONARY PROGRESS NOTE  Name: Cheryl [name] MRN: [id number] DOB: 05/12/1925    ADMISSION DATE:  03/09/2013  PRIMARY SERVICE:  PCCM  CHIEF COMPLAINT:  SOB, MRSA PNA  BRIEF PATIENT DESCRIPTION: 80 y/o F [facility] with productive cough, LE Swelling, & sputum positive for MRSA.    SIGNIFICANT EVENTS / STUDIES:  09/14/12 - [facility], complete occlusion of RML bronchus.  CT concerning for endobronchial lesion 09/21/12 - FOB: non-diagnostic> single focus abnormal glandular tissue, unable to definitively call malignancy 02/26/12 - [facility]. Sputum culture obtained=> MRSA (empirically treated with Cipro) .................................................................................................................................................................................. 03/09/12 - seen in office by RB - c/o increased LE swelling, cough with dark sputum production.  Sputum noted to be positive for MRSA.  Hx of enterobacter in past (none this culture). Rx called for Zyvox but pt unable to afford (>1500$).  [facility] work up.     CULTURES: Sputum 2/6>>>abundant MRSA  ANTIBIOTICS: Vancomycin (MRSA) 2/18>>>  SUBJECTIVE:  Pt feels much better and is ready to go home.  Picc line in place.  VITAL SIGNS: Temp:  [97.3 F (36.3 C)-98.3 F (36.8 C)] 98 F (36.7 C) (02/28 1345) Pulse Rate:  [72-92] 83 (02/28 1345) Resp:  [18-20] 20 (02/28 1345) BP: (135-157)/(36-78) 157/78 mmHg (02/28 1345) SpO2:  [94 %-99 %] 97 % (02/28 1345)  PHYSICAL EXAMINATION: Gen: frail appearing female in nad HEENT:  Nose without purulence or d/c, neck without LN or TMG Lungs:  Rhonchi noted, no wheezing Cardiovascular: RRR s M Ext: LE with minimal edema, no cyanosis Neuro: alert, oriented, moves all 4.     ASSESSMENT / PLAN:  MRSA PNA Bronchiectasis, without acute bronchospasm  Remote hx PE Plan: Complete 14-21  days vancomycin (start date 2/18) PICC in place Has ROV arranged with [facility] - At that time, would repeat CXR and decide whether further abx indicated.  Also needs a bmet to check sodium   CKD  Not a candidate for HD Hyponatremia, chronic - improved Plan: Check bmet at ov in office  Chronic Diastolic CHF PAF HTN  Coagulopathy -chronic coumadin administration Plan: coumadin per pharmacy    The pt is ready for d/c and has home hospice arranged.

## 2013-03-19 NOTE — Progress Notes (Signed)
ANTICOAGULATION CONSULT NOTE - Follow Up Consult  Pharmacy Consult for warfarin Indication: atrial fibrillation  Allergies  Allergen Reactions  . Codeine Other (See Comments)    unknown  . Sulfa Antibiotics     unknown   Patient Measurements: Height: 5\' 2"  (157.5 cm) Weight: 112 lb 10.5 oz (51.1 kg) IBW/kg (Calculated) : 50.1  Vital Signs: Temp: 97.3 F (36.3 C) (02/28 0633) Temp src: Oral (02/28 0633) BP: 152/38 mmHg (02/28 0633) Pulse Rate: 81 (02/28 0633)  Labs:  Recent Labs  03/17/13 0630 03/17/13 1125 03/18/13 0345 03/19/13 0350  HGB 7.9*  --   --   --   HCT 24.1*  --   --   --   PLT 291  --   --   --   LABPROT  --  21.4* 21.8* 22.1*  INR  --  1.92* 1.97* 2.00*  CREATININE  --   --  2.11*  --    Assessment: 89 YOF  [facility] with MRSA PNA, orders for pharmacy to continue warfarin therapy for h/o afib. Admit INR 2.96, no Warfarin given day of admit. Home dose reported as 3mg  daily, last dose unknown. Started on Aranesp (on Epo prior to admission).  Today's INR = 2, low-end therapeutic.  Unfortunately, warfarin dose was not administered to patient 2/26 PM, no documentation from RN for reason medication held.  No bleeding noted or passed on in report to AM shift today.  Warfarin from 2/20: 1.5, 0.5, 0.5, 1.5mg , 1.5mg , 2.5mg , 0mg , 2mg   2/26: Hgb = 7.9, decreasing, Platelets WNL  No significant drug interactions noted  PO intake low  Noted plans for possible D/C today  Goal of Therapy:  INR 2-3 Monitor platelets by anticoagulation protocol: Yes   Plan:   Warfarin 1.5mg  po x 1 today  Daily INR  If patient is discharged today, would recommend reducing home dose warfarin to 1.5mg  daily except 3mg  M/W/F with CLOSE INR follow-up.  Pt has required significantly lower doses of warfarin in hospital.    Juliette Alcideustin Zeigler, PharmD, BCPS.   Pager: [phone] 03/19/2013 9:38 AM

## 2013-03-19 NOTE — Progress Notes (Addendum)
N[facility] to make aware of dc home today with scheduled Vancomycin needed on 03/20/2013, last dose on 03/18/2013 at 2037. Hospice RN scheduled to admit pt today and will have administer IV abx on tomorrow. Requested dc summary. Faxed dc summary and today's progress notes. Unit RN will provided pt's family with contact number for Hospice. Isidoro DonningAlesia Taiesha Bovard RN CCM Case Mgmt phone [phone]

## 2013-03-22 ENCOUNTER — Ambulatory Visit: Payer: Medicare Other | Admitting: Emergency Medicine

## 2013-04-05 NOTE — Consult Note (Signed)
I have reviewed and discussed the care of this patient in detail with the nurse practitioner including pertinent patient records, physical exam findings and data. I agree with details of this encounter.  

## 2013-04-06 ENCOUNTER — Ambulatory Visit: Payer: Medicare Other | Admitting: Emergency Medicine

## 2013-04-10 LAB — AFB CULTURE WITH SMEAR (NOT AT ARMC): ACID FAST SMEAR: NONE SEEN

## 2013-04-20 DEATH — deceased

## 2013-06-09 ENCOUNTER — Telehealth: Payer: Self-pay

## 2013-06-09 NOTE — Telephone Encounter (Signed)
[facility] [employer]

## 2015-06-07 IMAGING — CT CT CHEST W/O CM
2 of 4 series · 15 of 36 positions shown, 18 images · IV contrast (Omnipaque 300)
Comparison: 10/16/2012

CLINICAL DATA: Followup pulmonary nodules

EXAM:
CT CHEST WITHOUT CONTRAST
TECHNIQUE: Multidetector CT imaging of the chest was performed following the
standard protocol without IV contrast.

[Series 2: chest routine with · axial · 0.61mm/px · z∈[-228,+7]mm · 12 of 57 slices shown, 15 images]
[im 5/57  mediastinal]
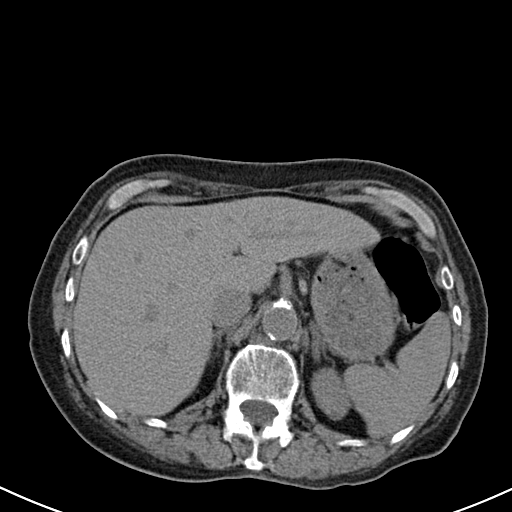
[im 5/57  lung]
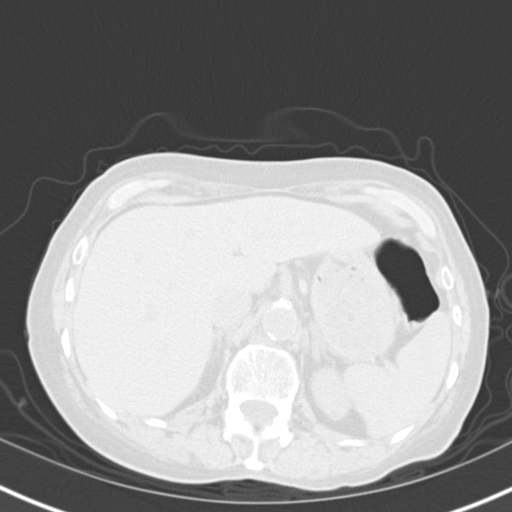
[im 9/57  lung]
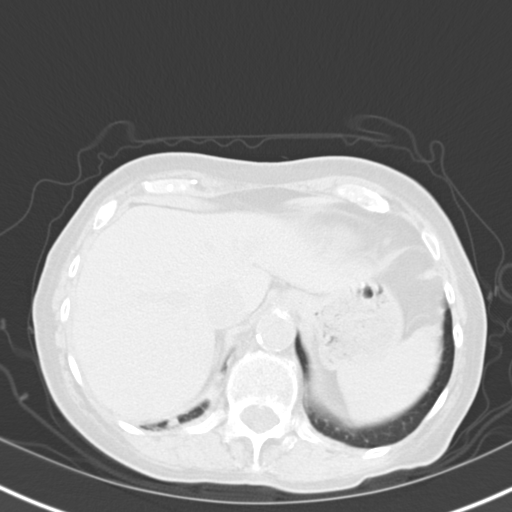
[im 13/57  lung]
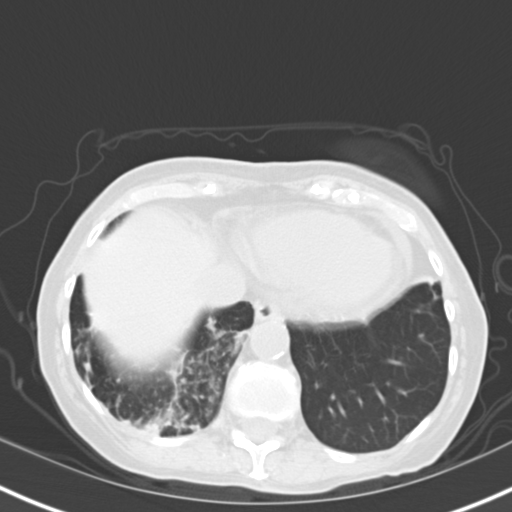
[im 18/57  lung]
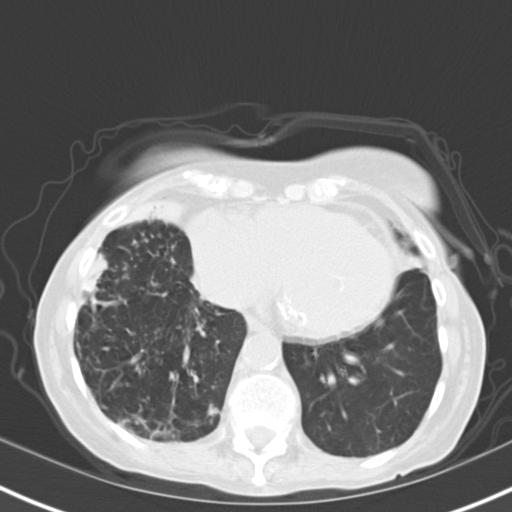
[im 22/57  mediastinal]
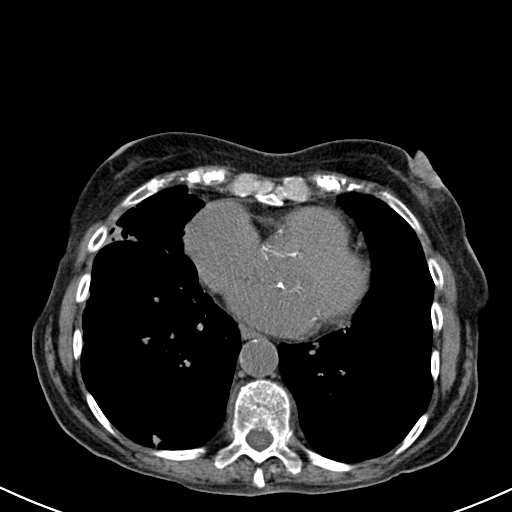
[im 22/57  lung]
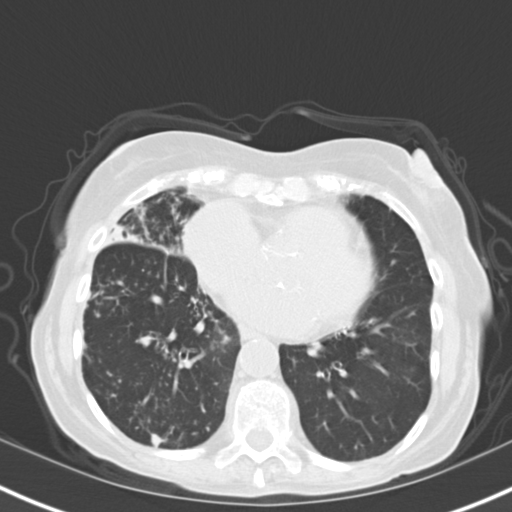
[im 26/57  lung]
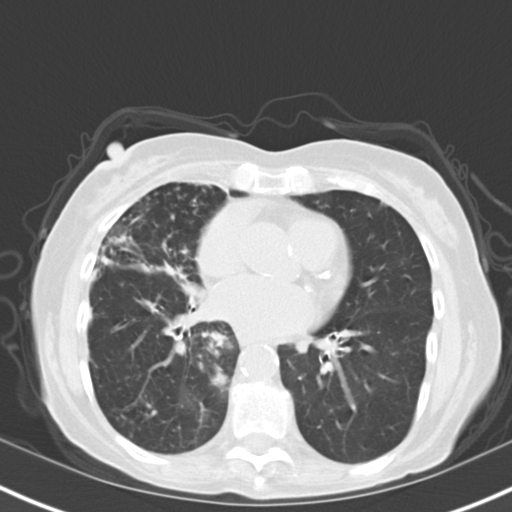
[im 31/57  lung]
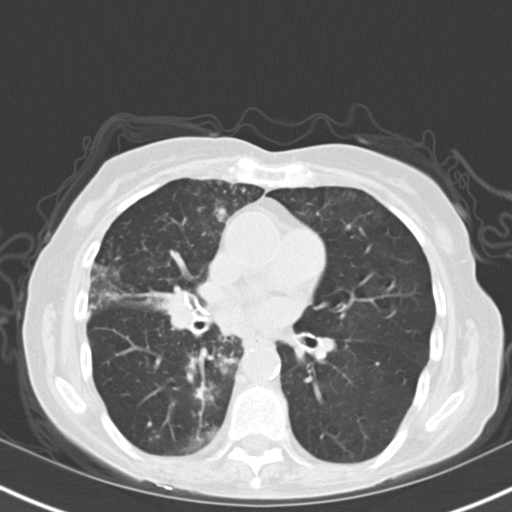
[im 35/57  lung]
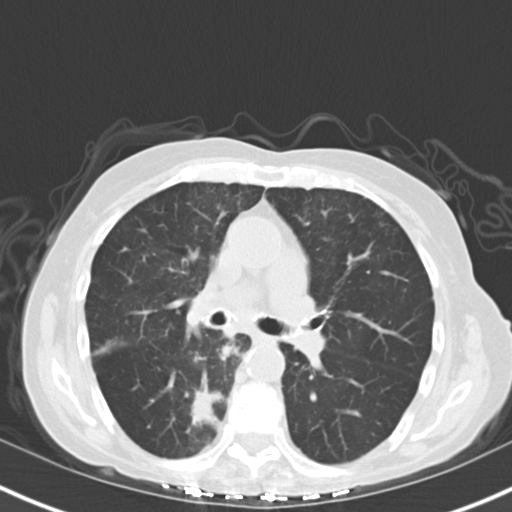
[im 39/57  mediastinal]
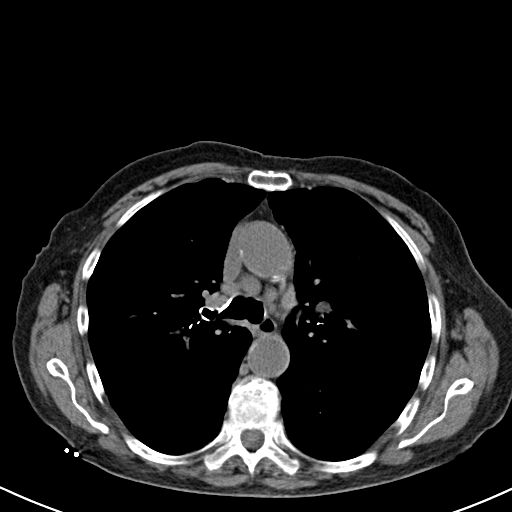
[im 39/57  lung]
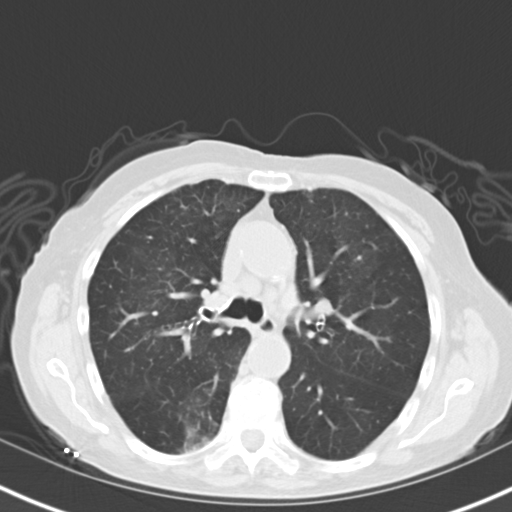
[im 44/57  lung]
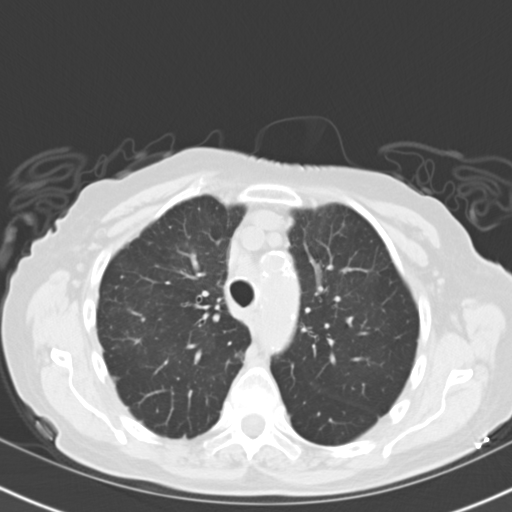
[im 48/57  lung]
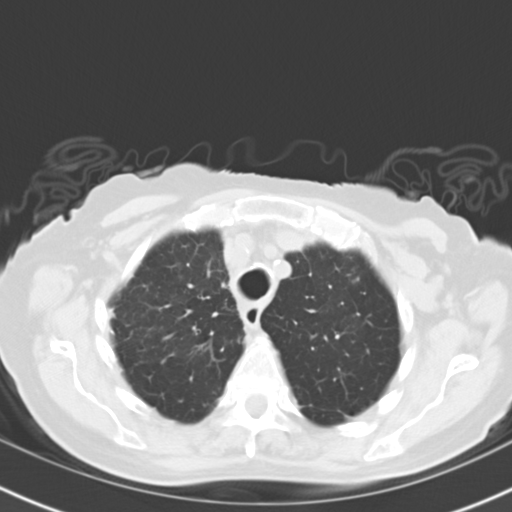
[im 52/57  lung]
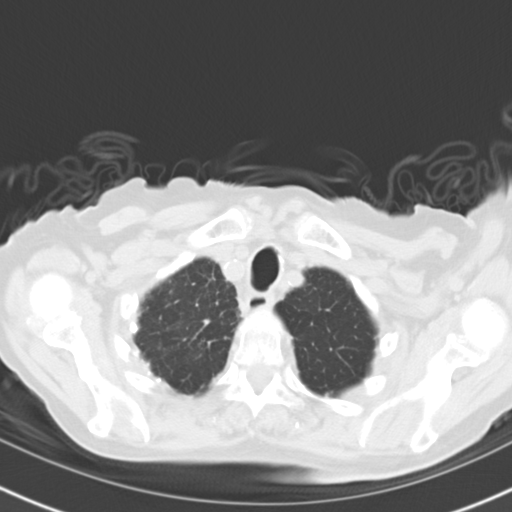

[Series 602: cor · coronal · 0.61mm/px · 3 of 94 slices shown]
[im 19/94  lung]
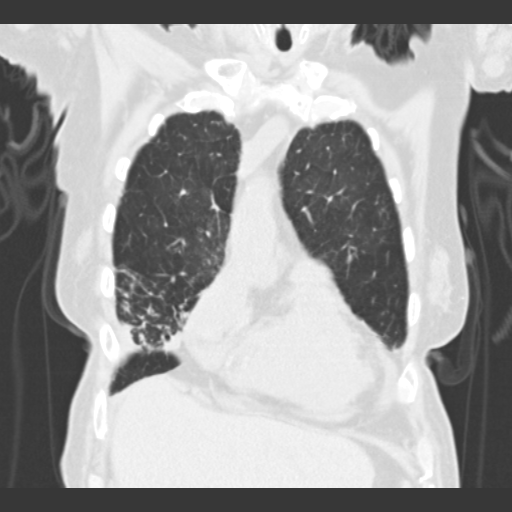
[im 38/94  lung]
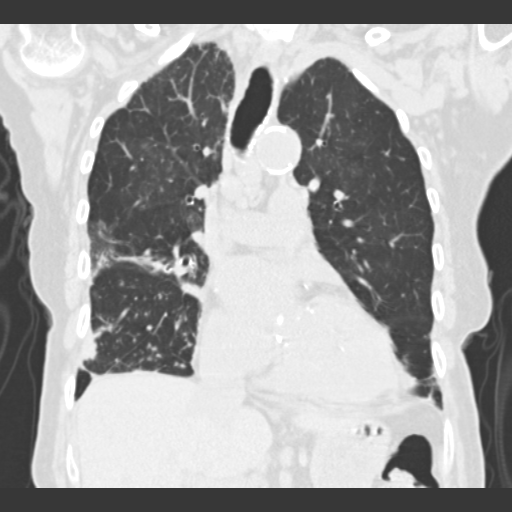
[im 56/94  lung]
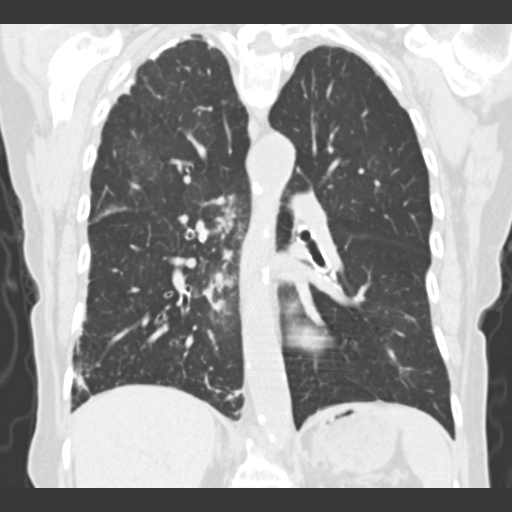

[15 of 36 positions shown; findings below may reference images not displayed]

FINDINGS: There is no pleural effusion identified. There has been interval
resolution of right middle lobe airspace consolidation. Multifocal
areas of nodularity identified throughout the right lung. This
predominantly affects the right middle lobe and right lower lobe.
Many of these nodules have a stress set mini of these nodules are
peripheral in location and have a tree-in-bud configuration. Areas
of bronchiectasis are noted within the right middle lobe and right
base. Mild atelectasis and bronchial wall thickening is noted within
the left upper lobe and superior portion of the left lower lobe.
There is a new nodular area of consolidation within the right lower
lobe, image 24/series 3.

The trachea appears patent. There is abnormal wall thickening
involving the bronchi, right greater than left. The heart size is
within normal limits. No significant pericardial effusion
identified. Persistent enlarged mediastinal lymph nodes. The right
paratracheal lymph node measures 1 cm, image 18/ series 2.
Previously 1.3 cm. Sub- carinal lymph node measures 1.3 cm, image
23/series 2. Previously this measured the same.

No axillary or supraclavicular lymph nodes.

Incidental imaging through the upper abdomen is on unremarkable. No
worrisome lytic or sclerotic bone lesions identified.
IMPRESSION: 1. Interval improvement in right middle lobe airspace consolidation.
Findings are likely the sequelae of inflammation or infection.
2. Persistent scattered areas of peripheral predominant tree-in-bud
nodularity involving the right midlung and right base. There are
also areas of bronchiectasis noted bilaterally. Findings are favored
to represent sequelae of chronic, atypical indolent infection.
3. New nodular density within the right lower lobe is also likely
postinflammatory infectious in etiology.
4. Persistent enlarged mediastinal lymph nodes which in the setting
of infection are likely reactive. Metastatic adenopathy is less
favored.
5. Calcified atherosclerotic disease.
